# Patient Record
Sex: Female | Born: 1945
Health system: Southern US, Community
[De-identification: ages and names within clinical notes are randomized; demographics above are authoritative.]

## PROBLEM LIST (undated history)

## (undated) DIAGNOSIS — M1990 Unspecified osteoarthritis, unspecified site: Secondary | ICD-10-CM

## (undated) DIAGNOSIS — M8580 Other specified disorders of bone density and structure, unspecified site: Secondary | ICD-10-CM

## (undated) DIAGNOSIS — Z789 Other specified health status: Secondary | ICD-10-CM

## (undated) DIAGNOSIS — S0300XA Dislocation of jaw, unspecified side, initial encounter: Secondary | ICD-10-CM

## (undated) DIAGNOSIS — E785 Hyperlipidemia, unspecified: Secondary | ICD-10-CM

## (undated) DIAGNOSIS — Z79890 Hormone replacement therapy (postmenopausal): Secondary | ICD-10-CM

## (undated) DIAGNOSIS — I10 Essential (primary) hypertension: Secondary | ICD-10-CM

## (undated) HISTORY — PX: NOSE SURGERY: SHX723

## (undated) HISTORY — PX: ANTERIOR AND POSTERIOR REPAIR: SHX1172

## (undated) HISTORY — DX: Dislocation of jaw, unspecified side, initial encounter: S03.00XA

## (undated) HISTORY — DX: Other specified health status: Z78.9

## (undated) HISTORY — DX: Unspecified osteoarthritis, unspecified site: M19.90

## (undated) HISTORY — DX: Other specified disorders of bone density and structure, unspecified site: M85.80

## (undated) HISTORY — PX: VAGINAL HYSTERECTOMY: SUR661

## (undated) HISTORY — DX: Hyperlipidemia, unspecified: E78.5

## (undated) HISTORY — DX: Essential (primary) hypertension: I10

## (undated) HISTORY — PX: BREAST ENHANCEMENT SURGERY: SHX7

## (undated) HISTORY — PX: OTHER SURGICAL HISTORY: SHX169

## (undated) HISTORY — PX: EYE SURGERY: SHX253

## (undated) HISTORY — PX: NASAL SINUS SURGERY: SHX719

## (undated) HISTORY — PX: INCONTINENCE SURGERY: SHX676

## (undated) HISTORY — PX: BLADDER SUSPENSION: SHX72

## (undated) HISTORY — DX: Hormone replacement therapy: Z79.890

## (undated) HISTORY — PX: AUGMENTATION MAMMAPLASTY: SUR837

---

## 1998-11-06 ENCOUNTER — Other Ambulatory Visit: Admission: RE | Admit: 1998-11-06 | Discharge: 1998-11-06 | Payer: Self-pay | Admitting: *Deleted

## 1999-12-30 ENCOUNTER — Other Ambulatory Visit: Admission: RE | Admit: 1999-12-30 | Discharge: 1999-12-30 | Payer: Self-pay | Admitting: *Deleted

## 2001-01-01 ENCOUNTER — Other Ambulatory Visit: Admission: RE | Admit: 2001-01-01 | Discharge: 2001-01-01 | Payer: Self-pay | Admitting: *Deleted

## 2005-05-05 ENCOUNTER — Ambulatory Visit: Payer: Self-pay | Admitting: Otolaryngology

## 2006-05-17 ENCOUNTER — Ambulatory Visit: Payer: Self-pay | Admitting: Obstetrics & Gynecology

## 2006-07-11 ENCOUNTER — Ambulatory Visit: Payer: Self-pay | Admitting: Gastroenterology

## 2007-06-25 ENCOUNTER — Ambulatory Visit: Payer: Self-pay | Admitting: Obstetrics & Gynecology

## 2008-06-26 ENCOUNTER — Ambulatory Visit: Payer: Self-pay | Admitting: Obstetrics & Gynecology

## 2008-07-11 ENCOUNTER — Ambulatory Visit: Payer: Self-pay | Admitting: Obstetrics & Gynecology

## 2009-09-14 ENCOUNTER — Ambulatory Visit: Payer: Self-pay | Admitting: Obstetrics & Gynecology

## 2009-10-07 ENCOUNTER — Ambulatory Visit: Payer: Self-pay | Admitting: Ophthalmology

## 2010-05-16 HISTORY — PX: BREAST SURGERY: SHX581

## 2010-06-29 ENCOUNTER — Ambulatory Visit: Payer: Self-pay | Admitting: Obstetrics and Gynecology

## 2010-06-29 DIAGNOSIS — Z0000 Encounter for general adult medical examination without abnormal findings: Secondary | ICD-10-CM

## 2010-08-06 NOTE — Assessment & Plan Note (Signed)
NAME:  [name], ZAMARRIPA NO.:  [id number]  MEDICAL RECORD NO.:  [id number]          PATIENT TYPE:  LOCATION:  [facility] at [facility]           FACILITY:  PHYSICIAN:  Tinnie Gens, MD             DATE OF BIRTH:  DATE OF SERVICE:  06/29/2010                                 CLINIC NOTE  CHIEF COMPLAINT:  Yearly physical.  HISTORY OF PRESENT ILLNESS:  The patient is a 65 year old, para 1, lady who comes in for an annual exam.  She is status post TVH with bladder tacking and previous sling procedure and continued incontinence which has improved with Estratest.  She has been on hormone replacement therapy for a long period time and would like to discuss the risks of this.  Additionally, the patient has history of osteopenia which has improved somewhat after being on Fosamax.  The patient also had a rupture of a silicone breast implant, so she had saline implants done approximately 2 weeks ago, and she developed a hematoma which had to be drained but this has been removed.  She still has a lot of bruising on her breasts and does not want them examined today.  Her last mammogram was in April 2011 and Bi-Rads 2 secondary to ruptured silicone implants.  PAST MEDICAL HISTORY: 1. TMJ. 2. Osteopenia. 3. History of hypertension.  PAST SURGICAL HISTORY:  Bilateral silicone implants 30 years ago, replaced approximately 2 weeks ago with saline implants, TVH, incontinence, A and P repair followed by a sling procedure and nasal surgery.  MEDICATIONS:  She is on Estratest one p.o. daily, calcium 600 mg p.o. daily, multivitamin one p.o. daily.  She has stopped previously prescribed hydrochlorothiazide for hypertension.  ALLERGIES:  None known.  She is not allergic to latex.  SOCIAL HISTORY:  She is married.  She has __________.  She denies tobacco, alcohol, or drug use.  FAMILY HISTORY:  Significant for colon, ovarian, and prostate cancer in her mother and her father.  A  14-point review of systems reviewed.  She denies significant headache, vision changes, chest pain, shortness of breath, abdominal pain, nausea, vomiting, diarrhea, constipation, blood in her stool, blood in her urine.  PHYSICAL EXAMINATION:  GENERAL:  She is a pleasant female, in no acute distress. VITAL SIGNS:  Blood pressure is 118/76, weight is 140, pulse 67. HEENT:  Normocephalic, atraumatic.  Sclerae anicteric. NECK:  Supple.  Normal thyroid. LUNGS:  Clear bilaterally. CARDIOVASCULAR:  Regular rate and rhythm.  No rubs, gallops, or murmurs. ABDOMEN:  Soft, nontender, nondistended. BREASTS:  There is marked amount of ecchymosis surrounding the lower portion of the right breast implant.  They do appear symmetric. EXTREMITIES:  No cyanosis, clubbing, or edema. GENITOURINARY:  Normal external female genitalia.  Vaginal cuff is intact. There is no mass of the cuff.  Her exam is nontender.  IMPRESSION:  Yearly physical status post recent breast implants, on Estratest.  PLAN: 1. Mammogram in November 2012. 2. I have discussed with the patient the risks, benefits of HRT     including slight increased risk of breast cancer.  The patient     accepts these risks and wants to continue her Estratest.  3. She has a yearly physical with her primary care physician in the     next few weeks.          ______________________________ Tinnie Gens, MD    TP/MEDQ  D:  06/29/2010  T:  06/30/2010  Job:  [id number]

## 2010-09-28 NOTE — Assessment & Plan Note (Signed)
NAME:  [name], [name]              ACCOUNT NO.:  [id number]   MEDICAL RECORD NO.:  [id number]          PATIENT TYPE:  POB   LOCATION:  [facility] at [facility]         FACILITY:  [facility]   PHYSICIAN:  Allie Bossier, MD        DATE OF BIRTH:  [birth year]   DATE OF SERVICE:                                  CLINIC NOTE   Ms. [name] is a 65 year old married white female who is here for annual  exam.  She tells me that her urinary incontinence is improved with the  Estratest.  However, she still has some issues, she is getting ready to  leave for [place] for admission trip, of note.   PAST MEDICAL HISTORY:  TMJ, borderline hypertension, and osteopenia  (she took Fosamax for 5 years, but quit in 2008).   OTHER MEDICAL PROBLEMS:  She has a hairline fracture in her right leg  bone.  She is seeing an orthopedic doctor for this.   REVIEW OF SYSTEMS:  She also sees a chiropractor since December 2009  acupuncture.  She is still married for 37 years, not sexually active  secondary to ED in her husband.  She had a bone density scan in 2009.   No known drug allergies.   SOCIAL HISTORY:  She denies alcohol or tobacco, but she drinks coffee.   PAST SURGICAL HISTORY:  Bilateral saline implants, breast implants more  than 30 years ago, TVH, incontinence sling, and nasal surgery.   MEDICATIONS:  1. Estratest.  2. Calcium daily.  3. Multivitamin daily.  4. Hydrochlorothiazide 12.5 mg p.o. b.i.d.   PHYSICAL EXAMINATION:  VITAL SIGNS:  Weight 135 pounds, blood pressure  98/69, and pulse 72.  HEENT:  Normal.  BREASTS:  Normal with saline implants bilaterally.  ABDOMEN:  Scaphoid.  No hepatosplenomegaly.  EXTERNAL GENITALIA:  No lesions.  Cuff normal.  She has a mild amount of  a vaginal and vulvar atrophy.   ASSESSMENT AND PLAN:  Annual exam.  I recommended self-breast and self  vulvar exams monthly.  With regard to her blood pressure and her  borderline hypertension, her blood pressure today is 98/69.   I have  recommended she half her hydrochlorothiazide twice a day (take 6.25 mg  twice a day).  We will recheck her blood pressure in a couple of weeks  and is stable.  If it is low still, we will consider discontinuing it.  The other would be with regard to her vaginal atrophy and stress  incontinence and her upcoming trip to [place].  I gave her samples of  Estrace vaginal cream.  She can use this half a gram 3 times a week and  see if this improves.  If she wants, we will give her a prescription.      Allie Bossier, MD     MCD/MEDQ  D:  06/26/2008  T:  06/26/2008  Job:  [id number]

## 2010-09-28 NOTE — Assessment & Plan Note (Signed)
NAME:  [name], [name] NO.:  [id number]   MEDICAL RECORD NO.:  [id number]          PATIENT TYPE:  POB   LOCATION:  [facility] at [facility]         FACILITY:  [facility]   PHYSICIAN:  Allie Bossier, MD        DATE OF BIRTH:  28-Aug-1945   DATE OF SERVICE:                                  CLINIC NOTE   IDENTIFICATION:  Ms. Ponce is a 65 year old married white female who is  here for annual exam.  She recently had her physical exam with her  medical doctor. Her regular medical doctor reports that her cholesterol  check was normal.  She needs a refill on her Estratest.  She has no  complaints.   PAST MEDICAL HISTORY:  TMJ.   PAST SURGICAL HISTORY:  1. Nasal surgery.  2. Incontinence sling.  3. Total vaginal hysterectomy.  4. Bilateral breast implants more than 30 years ago (saline).   FAMILY HISTORY:  Negative for breast cancer but her mother had uterine  and colon cancer.   REVIEW OF SYSTEMS:  She is married for 36 years.  She is not sexually  active secondary to ED in her husband.  She works for a [employer].   ALLERGIES:  NO KNOWN DRUG ALLERGIES.   SOCIAL HISTORY:  She drinks coffee but denies alcohol or tobacco use.   MEDICATIONS:  1. Estratest.  2. Calcium.  3. Multivitamin daily.  4. Hydrochlorothiazide 12-1/2 mg daily.   PHYSICAL EXAM:  VITALS:  Weight 140 pounds, blood pressure 124/81, pulse  77.  HEENT:  Normal.  HEART:  Regular rate and rhythm.  LUNGS:  Clear to auscultation bilaterally.  EXTERNAL GENITALIA:  Well estrogenized.  No lesions.  Vaginal cuff and  vagina no lesions.  Bimanual exam, no adnexal masses.  BREAST EXAM:  Relatively hardened breasts secondary to her saline  implants.  No palpable abnormalities.   ASSESSMENT/PLAN:  Annual exam.  I have recommend self vulvar and self  breast exams daily.  I have stressed the importance of vulvar exam and  self breast exams monthly.   Please note I have given her a refill for  Thereasa Solo, MD     MCD/MEDQ  D:  06/25/2007  T:  06/26/2007  Job:  [id number]

## 2010-09-28 NOTE — Assessment & Plan Note (Signed)
NAME:  [name], [name] NO.:  [id number]   MEDICAL RECORD NO.:  [id number]          PATIENT TYPE:  POB   [facility]  [facility]         FACILITY:  [facility]   PHYSICIAN:  Allie Bossier, MD        DATE OF BIRTH:  Jun 22, 1945   DATE OF SERVICE:  09/14/2009                                  CLINIC NOTE   IDENTIFICATION:  [name] is a 65 year old married white lady who is  here for annual exam.  She says her incontinence is improving with the  Estratest and she does not wished further treated.  She has no  particular other GYN complaints.   PAST MEDICAL HISTORY:  Osteopenia, TMJ, and borderline hypertension.   REVIEW OF SYSTEMS:  She is scheduled to have right cataract surgery this  month.  She has been married since 1973.  She is occasionally sexually  active, but her husband has some issues with ED.   MEDICATIONS:  She takes HCTZ 6.25 mg on occasion, as of today she did  take it.  Multivitamin daily, calcium 600 mg daily and Estratest daily.   She regularly goes to [place] for mission trips.  She had a DEXA scan this  year and it was somewhat improved.   SOCIAL HISTORY:  She denies tobacco, alcohol, or drug use.   ALLERGIES:  No known drug allergies.  No latex allergies.   PAST SURGICAL HISTORY:  Bilateral saline implants 30 years ago, a TVH  with an incontinence sling, and nasal surgery.   PHYSICAL EXAMINATION:  GENERAL:  Well-nourished, well-hydrated pleasant  white female in no apparent distress.  VITAL SIGNS:  Height 5 feet 6 inches, weight 139, blood pressure 124/69,  pulse 78.  HEENT:  Normal.  BREASTS:  Normal with implants.  HEART:  Regular rate and rhythm.  LUNGS:  Clear to auscultation bilaterally.  ABDOMEN:  Benign.  No palpable hepatosplenomegaly.  PELVIC:  External genitalia, no lesions except for few varicosities.  Vaginal cuff normal.  Her entire vagina is well estrogenized.  There are  no lesions.  Bimanual exam, there is no masses on  exam and her exam is  nontender.   ASSESSMENT AND PLAN:  Annual exam.  I have recommended self-breast and  self-vulvar exams.  I have recommended that she discontinue her HCTZ  entirely, she come back for blood pressure check within the next month.      Allie Bossier, MD     MCD/MEDQ  D:  09/14/2009  T:  09/15/2009  Job:  [id number]

## 2010-10-01 NOTE — Assessment & Plan Note (Signed)
NAME:  Carolyn, Johnson NO.:  [id number]   MEDICAL RECORD NO.:  [id number]          PATIENT TYPE:  POB   LOCATION:  [facility]         FACILITY:  [facility]   PHYSICIAN:  Carolyn Lincoln, [name]      DATE OF BIRTH:  12/16/1945   DATE OF SERVICE:                                  CLINIC NOTE   HISTORY OF PRESENT ILLNESS:  [name] is a 65 year old married, white  female who is here for her annual exam.  She is planning to have her  mammograms next month.  She needs a refill on her Estratest.  She is  very happy with this drug.  She has no other complaints.   CURRENT MEDICATIONS:  She takes Designer, industrial/product daily.  She takes an  unidentified pain medicine twice a day for TMJ.  She takes Tylenol as  necessary.  She takes vitamins, calcium and juice plus pills.  She  also takes a steroid nasal spray on a daily basis.   PAST SURGICAL HISTORY:  1. Nasal surgery in the last year.  2. Incontinence slings.  3. Hysterectomy.  4. Bilateral breast implants 30+ years ago.   FAMILY HISTORY:  Negative for breast cancer, but her mother had uterine  and colon cancer.   REVIEW OF SYSTEMS:  She is married for 35 years.  She is rarely sexually  active secondary to ED in her husband.  She works for a [employer].  She has rare genuine stress urinary incontinence.  She is  status post-several colonoscopies and these are up to date.   ALLERGIES:  No known drug allergies.   SOCIAL HISTORY:  She denies tobacco or alcohol, but does drink 4 to 5  cups of coffee a day.   PHYSICAL EXAMINATION:  VITAL SIGNS:  Weight 135 pounds.  Blood pressure  140/80, pulse 60.  HEENT:  Normal.  HEART:  Regular rate and rhythm.  LUNGS:  Clear to auscultation bilaterally.  ABDOMEN:  Benign.  BREASTS:  Significant for moderately firm implants.  No palpable masses.  No adenopathy is noted.  GU:  External genitalia is normal.  Mild atrophy.  Cuff is normal with  no evidence of prolapse.  Bimanual  exam reveals no adnexal masses.   ASSESSMENT/PLAN:  A 65 year old hysterectomized patient for her annual  exam.  I refilled her Estratest with 3 months supply with a year's  refill.  I recommended a vulvar exam on a monthly basis as well as  breast exam.           ______________________________  Carolyn Lincoln, [name]     KL/MEDQ  D:  05/17/2006  T:  05/17/2006  Job:  [id number]

## 2011-04-25 ENCOUNTER — Ambulatory Visit (INDEPENDENT_AMBULATORY_CARE_PROVIDER_SITE_OTHER): Payer: BC Managed Care – PPO | Admitting: Obstetrics & Gynecology

## 2011-04-25 ENCOUNTER — Other Ambulatory Visit: Payer: Self-pay | Admitting: Obstetrics & Gynecology

## 2011-04-25 ENCOUNTER — Encounter: Payer: Self-pay | Admitting: Obstetrics & Gynecology

## 2011-04-25 VITALS — BP 136/78 | HR 68 | Ht 66.0 in | Wt 137.0 lb

## 2011-04-25 DIAGNOSIS — R1900 Intra-abdominal and pelvic swelling, mass and lump, unspecified site: Secondary | ICD-10-CM

## 2011-04-25 DIAGNOSIS — Z0000 Encounter for general adult medical examination without abnormal findings: Secondary | ICD-10-CM

## 2011-04-25 DIAGNOSIS — Z01419 Encounter for gynecological examination (general) (routine) without abnormal findings: Secondary | ICD-10-CM

## 2011-04-25 MED ORDER — TESTOSTERONE PROPIONATE 2 % TD CREA: TOPICAL_CREAM | TRANSDERMAL | Status: DC

## 2011-04-25 MED ORDER — ESTRADIOL 0.5 MG PO TABS: 1.0000 mg | ORAL_TABLET | Freq: Every day | ORAL | Status: DC

## 2011-04-25 MED ORDER — HYDROCHLOROTHIAZIDE 12.5 MG PO CAPS: 12.5000 mg | ORAL_CAPSULE | Freq: Every day | ORAL | Status: DC

## 2011-04-25 MED ORDER — TESTOSTERONE PROPIONATE 2 % TD OINT: TOPICAL_OINTMENT | TRANSDERMAL | Status: DC

## 2011-04-25 NOTE — Progress Notes (Signed)
Addended by: Allie Bossier on: 04/25/2011 03:31 PM   Modules accepted: Orders

## 2011-04-25 NOTE — Progress Notes (Signed)
Subjective:    [name] is a 65 y.o. female who presents for an annual exam. Her insurance has changed and she wants to decrease her cost of the Estratest (so I will divide them into 2 generic drugs). She would also like a refill on her HCTZ. The patient is not currently sexually active. GYN screening history: last pap: was normal. The patient wears seatbelts: yes. The patient participates in regular exercise: no. Has the patient ever been transfused or tattooed?: no. The patient reports that there is not domestic violence in her life.   Menstrual History: OB History    Grav Para Term Preterm Abortions TAB SAB Ect Mult Living   1 1              Menarche age: 68 No LMP recorded. Patient has had a hysterectomy.    The following portions of the patient's history were reviewed and updated as appropriate: allergies, current medications, past family history, past medical history, past social history, past surgical history and problem list.  Review of Systems A comprehensive review of systems was negative.  She is married and rarely sexually active as her husband has ED. She is retired. She is scheduled to have her encapsulated silicone implants replaced in January by Dr. Benna Dunks.   Objective:    BP 136/78  Pulse 68  Ht 5\' 6"  (1.676 m)  Wt 137 lb (62.143 kg)  BMI 22.11 kg/m2  General Appearance:    Alert, cooperative, no distress, appears stated age  Head:    Normocephalic, without obvious abnormality, atraumatic  Eyes:    PERRL, conjunctiva/corneas clear, EOM's intact, fundi    benign, both eyes  Ears:    Normal TM's and external ear canals, both ears  Nose:   Nares normal, septum midline, mucosa normal, no drainage    or sinus tenderness  Throat:   Lips, mucosa, and tongue normal; teeth and gums normal  Neck:   Supple, symmetrical, trachea midline, no adenopathy;    thyroid:  no enlargement/tenderness/nodules; no carotid   bruit or JVD  Back:     Symmetric, no curvature, ROM  normal, no CVA tenderness  Lungs:     Clear to auscultation bilaterally, respirations unlabored  Chest Wall:    No tenderness or deformity   Heart:    Regular rate and rhythm, S1 and S2 normal, no murmur, rub   or gallop  Breast Exam:    No tenderness, masses, or nipple abnormality  Abdomen:     Soft, non-tender, bowel sounds active all four quadrants,    no masses, no organomegaly  Genitalia:    Normal female without lesion, discharge or tenderness, minimal atrophy. Normal cuff. Questionable right adnexal mass versus stool     Extremities:   Extremities normal, atraumatic, no cyanosis or edema  Pulses:   2+ and symmetric all extremities  Skin:   Skin color, texture, turgor normal, no rashes or lesions  Lymph nodes:   Cervical, supraclavicular, and axillary nodes normal  Neurologic:   CNII-XII intact, normal strength, sensation and reflexes    throughout  .    Assessment:    Healthy female exam.  Questionable right adnexal mass   Plan:     pelvic ultrasound  , mammogram, I will refill all her meds.

## 2011-05-02 ENCOUNTER — Other Ambulatory Visit: Payer: Self-pay | Admitting: Gynecology

## 2011-05-02 MED ORDER — ESTRADIOL 0.5 MG PO TABS: 1.0000 mg | ORAL_TABLET | Freq: Every day | ORAL | Status: DC

## 2011-05-03 ENCOUNTER — Ambulatory Visit (HOSPITAL_COMMUNITY)
Admission: RE | Admit: 2011-05-03 | Discharge: 2011-05-03 | Disposition: A | Discharge status: Home / Self Care | Payer: Medicare Other | Source: Ambulatory Visit | Attending: Obstetrics & Gynecology | Admitting: Obstetrics & Gynecology

## 2011-05-03 DIAGNOSIS — Z0000 Encounter for general adult medical examination without abnormal findings: Secondary | ICD-10-CM

## 2011-05-03 DIAGNOSIS — N9489 Other specified conditions associated with female genital organs and menstrual cycle: Secondary | ICD-10-CM | POA: Insufficient documentation

## 2011-05-03 DIAGNOSIS — Z90710 Acquired absence of both cervix and uterus: Secondary | ICD-10-CM | POA: Insufficient documentation

## 2011-05-03 DIAGNOSIS — R1900 Intra-abdominal and pelvic swelling, mass and lump, unspecified site: Secondary | ICD-10-CM

## 2011-05-18 ENCOUNTER — Other Ambulatory Visit: Payer: Self-pay | Admitting: *Deleted

## 2011-05-18 DIAGNOSIS — N951 Menopausal and female climacteric states: Secondary | ICD-10-CM

## 2011-05-18 MED ORDER — HYDROCHLOROTHIAZIDE 12.5 MG PO CAPS: 12.5000 mg | ORAL_CAPSULE | Freq: Every day | ORAL | Status: DC

## 2011-05-18 MED ORDER — ESTRADIOL 1 MG PO TABS: 1.0000 mg | ORAL_TABLET | Freq: Every day | ORAL | Status: DC

## 2011-05-18 MED ORDER — TESTOSTERONE PROPIONATE 2 % TD OINT: TOPICAL_OINTMENT | TRANSDERMAL | Status: DC

## 2011-05-18 NOTE — Telephone Encounter (Signed)
Patient needs medications sent to her mail order pharmacy in the correct dosage  And amount.

## 2011-05-31 ENCOUNTER — Telehealth: Payer: Self-pay | Admitting: *Deleted

## 2011-05-31 DIAGNOSIS — N951 Menopausal and female climacteric states: Secondary | ICD-10-CM

## 2011-05-31 MED ORDER — TESTOSTERONE PROPIONATE 2 % TD OINT: TOPICAL_OINTMENT | TRANSDERMAL | Status: DC

## 2011-05-31 NOTE — Telephone Encounter (Signed)
Patients medication was not received by her pharmacy and would like it resent.

## 2011-09-13 ENCOUNTER — Encounter: Payer: Self-pay | Admitting: Orthopedic Surgery

## 2011-09-14 ENCOUNTER — Encounter: Payer: Self-pay | Admitting: Orthopedic Surgery

## 2012-07-02 ENCOUNTER — Ambulatory Visit: Payer: BC Managed Care – PPO | Admitting: Obstetrics & Gynecology

## 2012-07-17 ENCOUNTER — Ambulatory Visit: Payer: BC Managed Care – PPO | Admitting: Obstetrics & Gynecology

## 2012-07-30 ENCOUNTER — Encounter: Payer: Self-pay | Admitting: Obstetrics & Gynecology

## 2012-07-30 ENCOUNTER — Ambulatory Visit (INDEPENDENT_AMBULATORY_CARE_PROVIDER_SITE_OTHER): Payer: Medicare Other | Admitting: Obstetrics & Gynecology

## 2012-07-30 VITALS — BP 115/76 | HR 78 | Ht 66.0 in | Wt 141.0 lb

## 2012-07-30 DIAGNOSIS — Z01419 Encounter for gynecological examination (general) (routine) without abnormal findings: Secondary | ICD-10-CM

## 2012-07-30 MED ORDER — EST ESTROGENS-METHYLTEST 1.25-2.5 MG PO TABS: 1.0000 | ORAL_TABLET | Freq: Every day | ORAL | Status: DC

## 2012-07-30 MED ORDER — HYDROCHLOROTHIAZIDE 12.5 MG PO CAPS: 12.5000 mg | ORAL_CAPSULE | Freq: Every day | ORAL | Status: DC

## 2012-07-30 NOTE — Progress Notes (Signed)
Subjective:    [name] is a 66 y.o. female who presents for an annual exam. The patient has no complaints today. She wants her meds refilled. The patient is rarely sexually active. GYN screening history: last pap: was normal. The patient wears seatbelts: yes. The patient participates in regular exercise: yes. (pilates/yoga) Has the patient ever been transfused or tattooed?: no. The patient reports that there is not domestic violence in her life.   Menstrual History: OB History   Grav Para Term Preterm Abortions TAB SAB Ect Mult Living   1 1              Menarche age: 32  No LMP recorded. Patient has had a hysterectomy.    The following portions of the patient's history were reviewed and updated as appropriate: allergies, current medications, past family history, past medical history, past social history, past surgical history and problem list.  Review of Systems A comprehensive review of systems was negative. Married for 41 years, retired.    Objective:    BP 115/76  Pulse 78  Ht 5\' 6"  (1.676 m)  Wt 141 lb (63.957 kg)  BMI 22.77 kg/m2  General Appearance:    Alert, cooperative, no distress, appears stated age  Head:    Normocephalic, without obvious abnormality, atraumatic  Eyes:    PERRL, conjunctiva/corneas clear, EOM's intact, fundi    benign, both eyes  Ears:    Normal TM's and external ear canals, both ears  Nose:   Nares normal, septum midline, mucosa normal, no drainage    or sinus tenderness  Throat:   Lips, mucosa, and tongue normal; teeth and gums normal  Neck:   Supple, symmetrical, trachea midline, no adenopathy;    thyroid:  no enlargement/tenderness/nodules; no carotid   bruit or JVD  Back:     Symmetric, no curvature, ROM normal, no CVA tenderness  Lungs:     Clear to auscultation bilaterally, respirations unlabored  Chest Wall:    No tenderness or deformity   Heart:    Regular rate and rhythm, S1 and S2 normal, no murmur, rub   or gallop  Breast Exam:     No tenderness, masses, or nipple abnormality  Abdomen:     Soft, non-tender, bowel sounds active all four quadrants,    no masses, no organomegaly  Genitalia:    Normal female without lesion, discharge or tenderness, minimal atrophy, normal vagina/pelvic exam (no tenderness or masses)     Extremities:   Extremities normal, atraumatic, no cyanosis or edema  Pulses:   2+ and symmetric all extremities  Skin:   Skin color, texture, turgor normal, no rashes or lesions  Lymph nodes:   Cervical, supraclavicular, and axillary nodes normal  Neurologic:   CNII-XII intact, normal strength, sensation and reflexes    throughout  .    Assessment:    Healthy female exam.    Plan:     Breast self exam technique reviewed and patient encouraged to perform self-exam monthly. Mammogram.  I have refilled her Estratest

## 2013-08-06 ENCOUNTER — Ambulatory Visit: Payer: Medicare Other | Admitting: Obstetrics & Gynecology

## 2013-09-10 ENCOUNTER — Ambulatory Visit (INDEPENDENT_AMBULATORY_CARE_PROVIDER_SITE_OTHER): Payer: Medicare Other | Admitting: Obstetrics & Gynecology

## 2013-09-10 ENCOUNTER — Encounter: Payer: Self-pay | Admitting: Obstetrics & Gynecology

## 2013-09-10 VITALS — BP 133/73 | HR 71 | Ht 65.0 in | Wt 131.0 lb

## 2013-09-10 DIAGNOSIS — Z01419 Encounter for gynecological examination (general) (routine) without abnormal findings: Secondary | ICD-10-CM

## 2013-09-10 DIAGNOSIS — Z0000 Encounter for general adult medical examination without abnormal findings: Secondary | ICD-10-CM

## 2013-09-10 MED ORDER — EST ESTROGENS-METHYLTEST 1.25-2.5 MG PO TABS: 1.0000 | ORAL_TABLET | Freq: Every day | ORAL | Status: DC

## 2013-09-10 NOTE — Addendum Note (Signed)
Addended by: Erik Obey on: 09/10/2013 03:22 PM   Modules accepted: Orders

## 2013-09-10 NOTE — Progress Notes (Signed)
Subjective:    [name] is a 68 y.o. female who presents for an annual exam. She mentions that when she was in [place] recently, she developed some groin irritation with all the sweating. It's much better now. The patient is sexually active. GYN screening history: last pap: was normal. The patient wears seatbelts: yes. The patient participates in regular exercise: yes. Has the patient ever been transfused or tattooed?: no. The patient reports that there is not domestic violence in her life.   Menstrual History: OB History   Grav Para Term Preterm Abortions TAB SAB Ect Mult Living   1 1 1       1       Menarche age: 29  No LMP recorded. Patient has had a hysterectomy.    The following portions of the patient's history were reviewed and updated as appropriate: allergies, current medications, past family history, past medical history, past social history, past surgical history and problem list.  Review of Systems A comprehensive review of systems was negative. Travels to [place] frequently for mission trips. Married 42 years.Her colonoscopy is UTD.   Objective:    BP 133/73  Pulse 71  Ht 5\' 5"  (1.651 m)  Wt 131 lb (59.421 kg)  BMI 21.80 kg/m2  General Appearance:    Alert, cooperative, no distress, appears stated age  Head:    Normocephalic, without obvious abnormality, atraumatic  Eyes:    PERRL, conjunctiva/corneas clear, EOM's intact, fundi    benign, both eyes  Ears:    Normal TM's and external ear canals, both ears  Nose:   Nares normal, septum midline, mucosa normal, no drainage    or sinus tenderness  Throat:   Lips, mucosa, and tongue normal; teeth and gums normal  Neck:   Supple, symmetrical, trachea midline, no adenopathy;    thyroid:  no enlargement/tenderness/nodules; no carotid   bruit or JVD  Back:     Symmetric, no curvature, ROM normal, no CVA tenderness  Lungs:     Clear to auscultation bilaterally, respirations unlabored  Chest Wall:    No tenderness or  deformity   Heart:    Regular rate and rhythm, S1 and S2 normal, no murmur, rub   or gallop  Breast Exam:    No tenderness, masses, or nipple abnormality  Abdomen:     Soft, non-tender, bowel sounds active all four quadrants,    no masses, no organomegaly  Genitalia:    Normal female without lesion, discharge or tenderness, no masses on bimanual     Extremities:   Extremities normal, atraumatic, no cyanosis or edema  Pulses:   2+ and symmetric all extremities  Skin:   Skin color, texture, turgor normal, no rashes or lesions  Lymph nodes:   Cervical, supraclavicular, and axillary nodes normal  Neurologic:   CNII-XII intact, normal strength, sensation and reflexes    throughout  .    Assessment:    Healthy female exam.    Plan:     Breast self exam technique reviewed and patient encouraged to perform self-exam monthly. Mammogram. already scheduled. Estratest refilled per her request

## 2013-09-10 NOTE — Addendum Note (Signed)
Addended by: Erik Obey on: 09/10/2013 02:17 PM   Modules accepted: Orders

## 2013-11-24 LAB — HM COLONOSCOPY

## 2013-12-13 ENCOUNTER — Ambulatory Visit: Payer: Self-pay | Admitting: Gastroenterology

## 2014-03-17 ENCOUNTER — Encounter: Payer: Self-pay | Admitting: Obstetrics & Gynecology

## 2014-04-22 ENCOUNTER — Telehealth: Payer: Self-pay | Admitting: *Deleted

## 2014-04-22 NOTE — Telephone Encounter (Signed)
Patient would like to know if she can have a prescription for separate testosterone and estrogen as the Estratest prescription that she has increased in price and is 100.00 per month.  Just let me know if this can be done and I will call it in for her.

## 2014-04-22 NOTE — Telephone Encounter (Signed)
Patient is not able to fill Estratest prescription because the cost has increased significantly.  She wants to know if she can take two separate prescriptions of estrogen and testosterone that would work just as well and not be 100.00 a month.  Just let me know and I will call it in for her if we can do this.

## 2014-05-05 ENCOUNTER — Telehealth: Payer: Self-pay | Admitting: *Deleted

## 2014-05-05 DIAGNOSIS — Z780 Asymptomatic menopausal state: Secondary | ICD-10-CM

## 2014-05-05 MED ORDER — FIRST-TESTOSTERONE 2 % TD OINT: 1.0000 [drp] | TOPICAL_OINTMENT | TRANSDERMAL | Status: DC

## 2014-05-05 MED ORDER — ESTRADIOL 0.5 MG PO TABS: 0.5000 mg | ORAL_TABLET | Freq: Every day | ORAL | Status: DC

## 2014-05-05 NOTE — Telephone Encounter (Signed)
-----   Message from Emily Filbert, MD sent at 05/05/2014  3:00 PM EST ----- She can have estradiol 0.5 mg daily and testosterone ointment 2%, apply small amount 3 times per week. Thanks

## 2014-07-18 ENCOUNTER — Encounter (INDEPENDENT_AMBULATORY_CARE_PROVIDER_SITE_OTHER): Payer: PPO | Admitting: Ophthalmology

## 2014-07-18 DIAGNOSIS — H35033 Hypertensive retinopathy, bilateral: Secondary | ICD-10-CM | POA: Diagnosis not present

## 2014-07-18 DIAGNOSIS — I10 Essential (primary) hypertension: Secondary | ICD-10-CM | POA: Diagnosis not present

## 2014-07-18 DIAGNOSIS — H43813 Vitreous degeneration, bilateral: Secondary | ICD-10-CM | POA: Diagnosis not present

## 2014-10-27 ENCOUNTER — Encounter: Payer: Self-pay | Admitting: Family Medicine

## 2014-10-27 ENCOUNTER — Ambulatory Visit (INDEPENDENT_AMBULATORY_CARE_PROVIDER_SITE_OTHER): Payer: PPO | Admitting: Family Medicine

## 2014-10-27 VITALS — BP 110/68 | HR 68 | Temp 98.1°F | Ht 64.75 in | Wt 134.0 lb

## 2014-10-27 DIAGNOSIS — Z780 Asymptomatic menopausal state: Secondary | ICD-10-CM

## 2014-10-27 DIAGNOSIS — R002 Palpitations: Secondary | ICD-10-CM

## 2014-10-27 DIAGNOSIS — Z0000 Encounter for general adult medical examination without abnormal findings: Secondary | ICD-10-CM | POA: Diagnosis not present

## 2014-10-27 DIAGNOSIS — Z79899 Other long term (current) drug therapy: Secondary | ICD-10-CM

## 2014-10-27 DIAGNOSIS — N3946 Mixed incontinence: Secondary | ICD-10-CM | POA: Diagnosis not present

## 2014-10-27 DIAGNOSIS — Z1239 Encounter for other screening for malignant neoplasm of breast: Secondary | ICD-10-CM | POA: Diagnosis not present

## 2014-10-27 LAB — UA/M W/RFLX CULTURE, ROUTINE
BILIRUBIN UA: NEGATIVE
GLUCOSE, UA: NEGATIVE
KETONES UA: NEGATIVE
Leukocytes, UA: NEGATIVE
NITRITE UA: NEGATIVE
Protein, UA: NEGATIVE
RBC UA: NEGATIVE
Specific Gravity, UA: 1.015 (ref 1.005–1.030)
UUROB: 0.2 mg/dL (ref 0.2–1.0)
pH, UA: 7 (ref 5.0–7.5)

## 2014-10-27 MED ORDER — ESTRADIOL 0.1 MG/GM VA CREA: TOPICAL_CREAM | VAGINAL | Status: DC

## 2014-10-27 NOTE — Patient Instructions (Addendum)
Stop the estrogen/testosterone pills Start estrogen topically Take the HCTZ every OTHER day for 1-2 weeks, the stop completely and come by for a lab visit so Amy can check your blood pressure in 2-3 weeks Three servings of calcium daily (best in diet like broccoli, tofu, kale, spinach, almond milk, etc.) but supplement if absolutely needed to reach 1000 to 1200 mg daily Try turmeric as a natural anti-inflammatory (for pain and arthritis). It comes in capsules where you buy aspirin and fish oil, but also as a spice where you buy pepper and garlic powder. Try tylenol (acetaminophen) and/or biofreeze or topical ice therapy for knee arthritis  We'll refer you to a cardiologist about your heart pounding with exercises to see if they will consider a stress test I've ordered a bone density test for you If you have not heard anything from my staff in a week about any orders/referrals/studies entered today, please contact us here to follow-up Mammograms are recommended every 1-2 years Return in 12+ months for Medicare Wellness visit PLUS physical, 45 minute appointment   Health Maintenance Adopting a healthy lifestyle and getting preventive care can go a long way to promote health and wellness. Talk with your health care provider about what schedule of regular examinations is right for you. This is a good chance for you to check in with your provider about disease prevention and staying healthy. In between checkups, there are plenty of things you can do on your own. Experts have done a lot of research about which lifestyle changes and preventive measures are most likely to keep you healthy. Ask your health care provider for more information. WEIGHT AND DIET  Eat a healthy diet  Be sure to include plenty of vegetables, fruits, low-fat dairy products, and lean protein.  Do not eat a lot of foods high in solid fats, added sugars, or salt.  Get regular exercise. This is one of the most important things you  can do for your health.  Most adults should exercise for at least 150 minutes each week. The exercise should increase your heart rate and make you sweat (moderate-intensity exercise).  Most adults should also do strengthening exercises at least twice a week. This is in addition to the moderate-intensity exercise.  Maintain a healthy weight  Body mass index (BMI) is a measurement that can be used to identify possible weight problems. It estimates body fat based on height and weight. Your health care provider can help determine your BMI and help you achieve or maintain a healthy weight.  For females 46 years of age and older:   A BMI below 18.5 is considered underweight.  A BMI of 18.5 to 24.9 is normal.  A BMI of 25 to 29.9 is considered overweight.  A BMI of 30 and above is considered obese.  Watch levels of cholesterol and blood lipids  You should start having your blood tested for lipids and cholesterol at 69 years of age, then have this test every 5 years.  You may need to have your cholesterol levels checked more often if:  Your lipid or cholesterol levels are high.  You are older than 69 years of age.  You are at high risk for heart disease.  CANCER SCREENING   Lung Cancer  Lung cancer screening is recommended for adults 31-6 years old who are at high risk for lung cancer because of a history of smoking.  A yearly low-dose CT scan of the lungs is recommended for people who:  Currently  smoke.  Have quit within the past 15 years.  Have at least a 30-pack-year history of smoking. A pack year is smoking an average of one pack of cigarettes a day for 1 year.  Yearly screening should continue until it has been 15 years since you quit.  Yearly screening should stop if you develop a health problem that would prevent you from having lung cancer treatment.  Breast Cancer  Practice breast self-awareness. This means understanding how your breasts normally appear and  feel.  It also means doing regular breast self-exams. Let your health care provider know about any changes, no matter how small.  If you are in your 20s or 30s, you should have a clinical breast exam (CBE) by a health care provider every 1-3 years as part of a regular health exam.  If you are 16 or older, have a CBE every year. Also consider having a breast X-ray (mammogram) every year.  If you have a family history of breast cancer, talk to your health care provider about genetic screening.  If you are at high risk for breast cancer, talk to your health care provider about having an MRI and a mammogram every year.  Breast cancer gene (BRCA) assessment is recommended for women who have family members with BRCA-related cancers. BRCA-related cancers include:  Breast.  Ovarian.  Tubal.  Peritoneal cancers.  Results of the assessment will determine the need for genetic counseling and BRCA1 and BRCA2 testing. Cervical Cancer Routine pelvic examinations to screen for cervical cancer are no longer recommended for nonpregnant women who are considered low risk for cancer of the pelvic organs (ovaries, uterus, and vagina) and who do not have symptoms. A pelvic examination may be necessary if you have symptoms including those associated with pelvic infections. Ask your health care provider if a screening pelvic exam is right for you.   The Pap test is the screening test for cervical cancer for women who are considered at risk.  If you had a hysterectomy for a problem that was not cancer or a condition that could lead to cancer, then you no longer need Pap tests.  If you are older than 65 years, and you have had normal Pap tests for the past 10 years, you no longer need to have Pap tests.  If you have had past treatment for cervical cancer or a condition that could lead to cancer, you need Pap tests and screening for cancer for at least 20 years after your treatment.  If you no longer get a Pap  test, assess your risk factors if they change (such as having a new sexual partner). This can affect whether you should start being screened again.  Some women have medical problems that increase their chance of getting cervical cancer. If this is the case for you, your health care provider may recommend more frequent screening and Pap tests.  The human papillomavirus (HPV) test is another test that may be used for cervical cancer screening. The HPV test looks for the virus that can cause cell changes in the cervix. The cells collected during the Pap test can be tested for HPV.  The HPV test can be used to screen women 61 years of age and older. Getting tested for HPV can extend the interval between normal Pap tests from three to five years.  An HPV test also should be used to screen women of any age who have unclear Pap test results.  After 69 years of age, women should  have HPV testing as often as Pap tests.  Colorectal Cancer  This type of cancer can be detected and often prevented.  Routine colorectal cancer screening usually begins at 69 years of age and continues through 69 years of age.  Your health care provider may recommend screening at an earlier age if you have risk factors for colon cancer.  Your health care provider may also recommend using home test kits to check for hidden blood in the stool.  A small camera at the end of a tube can be used to examine your colon directly (sigmoidoscopy or colonoscopy). This is done to check for the earliest forms of colorectal cancer.  Routine screening usually begins at age 25.  Direct examination of the colon should be repeated every 5-10 years through 69 years of age. However, you may need to be screened more often if early forms of precancerous polyps or small growths are found. Skin Cancer  Check your skin from head to toe regularly.  Tell your health care provider about any new moles or changes in moles, especially if there is a  change in a mole's shape or color.  Also tell your health care provider if you have a mole that is larger than the size of a pencil eraser.  Always use sunscreen. Apply sunscreen liberally and repeatedly throughout the day.  Protect yourself by wearing long sleeves, pants, a wide-brimmed hat, and sunglasses whenever you are outside. HEART DISEASE, DIABETES, AND HIGH BLOOD PRESSURE   Have your blood pressure checked at least every 1-2 years. High blood pressure causes heart disease and increases the risk of stroke.  If you are between 52 years and 74 years old, ask your health care provider if you should take aspirin to prevent strokes.  Have regular diabetes screenings. This involves taking a blood sample to check your fasting blood sugar level.  If you are at a normal weight and have a low risk for diabetes, have this test once every three years after 69 years of age.  If you are overweight and have a high risk for diabetes, consider being tested at a younger age or more often. PREVENTING INFECTION  Hepatitis B  If you have a higher risk for hepatitis B, you should be screened for this virus. You are considered at high risk for hepatitis B if:  You were born in a country where hepatitis B is common. Ask your health care provider which countries are considered high risk.  Your parents were born in a high-risk country, and you have not been immunized against hepatitis B (hepatitis B vaccine).  You have HIV or AIDS.  You use needles to inject street drugs.  You live with someone who has hepatitis B.  You have had sex with someone who has hepatitis B.  You get hemodialysis treatment.  You take certain medicines for conditions, including cancer, organ transplantation, and autoimmune conditions. Hepatitis C  Blood testing is recommended for:  Everyone born from [birth year] through 1965.  Anyone with known risk factors for hepatitis C. Sexually transmitted infections (STIs)  You  should be screened for sexually transmitted infections (STIs) including gonorrhea and chlamydia if:  You are sexually active and are younger than 69 years of age.  You are older than 69 years of age and your health care provider tells you that you are at risk for this type of infection.  Your sexual activity has changed since you were last screened and you are at an increased risk  for chlamydia or gonorrhea. Ask your health care provider if you are at risk.  If you do not have HIV, but are at risk, it may be recommended that you take a prescription medicine daily to prevent HIV infection. This is called pre-exposure prophylaxis (PrEP). You are considered at risk if:  You are sexually active and do not regularly use condoms or know the HIV status of your partner(s).  You take drugs by injection.  You are sexually active with a partner who has HIV. Talk with your health care provider about whether you are at high risk of being infected with HIV. If you choose to begin PrEP, you should first be tested for HIV. You should then be tested every 3 months for as long as you are taking PrEP.  PREGNANCY   If you are premenopausal and you may become pregnant, ask your health care provider about preconception counseling.  If you may become pregnant, take 400 to 800 micrograms (mcg) of folic acid every day.  If you want to prevent pregnancy, talk to your health care provider about birth control (contraception). OSTEOPOROSIS AND MENOPAUSE   Osteoporosis is a disease in which the bones lose minerals and strength with aging. This can result in serious bone fractures. Your risk for osteoporosis can be identified using a bone density scan.  If you are 67 years of age or older, or if you are at risk for osteoporosis and fractures, ask your health care provider if you should be screened.  Ask your health care provider whether you should take a calcium or vitamin D supplement to lower your risk for  osteoporosis.  Menopause may have certain physical symptoms and risks.  Hormone replacement therapy may reduce some of these symptoms and risks. Talk to your health care provider about whether hormone replacement therapy is right for you.  HOME CARE INSTRUCTIONS   Schedule regular health, dental, and eye exams.  Stay current with your immunizations.   Do not use any tobacco products including cigarettes, chewing tobacco, or electronic cigarettes.  If you are pregnant, do not drink alcohol.  If you are breastfeeding, limit how much and how often you drink alcohol.  Limit alcohol intake to no more than 1 drink per day for nonpregnant women. One drink equals 12 ounces of beer, 5 ounces of wine, or 1 ounces of hard liquor.  Do not use street drugs.  Do not share needles.  Ask your health care provider for help if you need support or information about quitting drugs.  Tell your health care provider if you often feel depressed.  Tell your health care provider if you have ever been abused or do not feel safe at home. Document Released: 11/15/2010 Document Revised: 09/16/2013 Document Reviewed: 04/03/2013 [facility] Patient Information 2015 [place], [place]. This information is not intended to replace advice given to you by your health care provider. Make sure you discuss any questions you have with your health care provider.

## 2014-10-27 NOTE — Progress Notes (Signed)
Patient ID: [name], female   DOB: 13-Jan-1946, 69 y.o.   MRN: [id number]   Subjective:   [name] is a 69 y.o. female here for a complete physical exam  Interim issues since last visit:  Past Medical History  Diagnosis Date  . TMJ (dislocation of temporomandibular joint)   . Osteopenia   . Hypertension   . Cataract 2011    remove from right eye  . Arthritis     in back  . Hyperlipidemia    Past Surgical History  Procedure Laterality Date  . Breast enhancement surgery    . Incontinence surgery    . Anterior and posterior repair    . Bladder suspension    . Nose surgery    . Eye surgery  20114    right eye  .  mesh sling x 15 yrs    . Breast surgery  2012    BILATERAL BREAST IMPLANT REMOVAL  . Vaginal hysterectomy      still has ovaries  . Nasal sinus surgery     Family History  Problem Relation Age of Onset  . Cancer Mother     Johnson AND UTERINE  . Cancer Father     PROSTATE  . Diabetes Father   . Hypertension Father   . Stroke Father   . Arthritis Sister    History  Substance Use Topics  . Smoking status: Never Smoker   . Smokeless tobacco: Never Used  . Alcohol Use: No   Current Outpatient Prescriptions on File Prior to Visit  Medication Sig Dispense Refill  . Alfalfa 250 MG TABS Take by mouth.    Marland Kitchen aspirin 81 MG tablet Take 81 mg by mouth daily.    . calcium carbonate (OS-CAL) 600 MG TABS Take 600 mg by mouth 2 (two) times daily with a meal.      . celecoxib (CELEBREX) 200 MG capsule     . cholecalciferol (VITAMIN D) 1000 UNITS tablet Take 1,000 Units by mouth daily.    . fish oil-omega-3 fatty acids 1000 MG capsule Take 2 g by mouth daily.    Marland Kitchen glucosamine-chondroitin 500-400 MG tablet Take 1 tablet by mouth 3 (three) times daily.    . hydrochlorothiazide (MICROZIDE) 12.5 MG capsule Take 1 capsule (12.5 mg total) by mouth daily. 90 capsule 5  . multivitamin-lutein (OCUVITE-LUTEIN) CAPS Take 1 capsule by mouth daily.    . chloroquine  (ARALEN) 250 MG tablet Take 500 mg by mouth once a week.    Marland Kitchen FIRST-TESTOSTERONE 2 % OINT Place 1 drop onto the skin 3 (three) times a week. (Patient not taking: Reported on 10/27/2014) 60 g 3   No current facility-administered medications on file prior to visit.   No Known Allergies ------------- Review of Systems  Constitutional: Negative for fever and unexpected weight change.  HENT: Negative for congestion, dental problem and mouth sores.   Eyes: Negative for pain and discharge.  Respiratory: Negative for cough, choking and shortness of breath.   Cardiovascular: Negative for chest pain, palpitations and leg swelling.       She says her heart was really pounding like she had overworked, had to sit down and thought she was going to have a stroke; she was working in the yard; can't hardly work any more, like 5-10 minutes now instead of much longer before; she has just noticed this over the last few weeks; she had to go inside and laid down; rested a few minutes and she  was fine  Gastrointestinal: Negative for blood in stool.  Endocrine: Negative for polydipsia, polyphagia and polyuria.  Genitourinary: Negative for hematuria.       Some urine leaking, all the time, not just coughing or laughing; would like to try topical estrogen  Musculoskeletal:       Had bursitis last year, did acupuncture and dry needling; came right back after finishing treatment; saw Dr. Almedia Balls gave two cortisone injections a few weeks ago  Allergic/Immunologic: Negative for environmental allergies and food allergies.  Neurological: Negative for tremors, seizures, numbness and headaches.  Hematological: Does not bruise/bleed easily.       Bruise on skin where the puppy hit her  Psychiatric/Behavioral: Negative for hallucinations and dysphoric mood. The patient is not nervous/anxious.     No results found for: CHOL, HDL, LDLCALC, LDLDIRECT, TRIG, CHOLHDL No results found for: HGBA1C No results found for:  MICROALBUR, MALB24HUR No results found for: NA, K, CL, CO2 No results found for: CREATININE No results found for: WBC, HGB, HCT, MCV, PLT  Objective:   Filed Vitals:   10/27/14 0843  BP: 110/68  Pulse: 68  Temp: 98.1 F (36.7 C)  Height: 5' 4.75" (1.645 m)  Weight: 134 lb (60.782 kg)  SpO2: 97%   Body mass index is 22.46 kg/(m^2). Wt Readings from Last 3 Encounters:  10/27/14 134 lb (60.782 kg)  04/25/14 137 lb (62.143 kg)  09/10/13 131 lb (59.421 kg)   Physical Exam  Constitutional: She is well-developed, well-nourished, and in no distress. No distress.  HENT:  Head: Normocephalic and atraumatic.  Mouth/Throat: Oropharynx is clear and moist. No oropharyngeal exudate.  Eyes: EOM are normal. Right eye exhibits no discharge. Left eye exhibits no discharge. No scleral icterus.  Neck: Normal range of motion. Neck supple. No JVD present. No thyromegaly present.  Cardiovascular: Normal rate, regular rhythm and normal heart sounds.  Exam reveals no friction rub.   No murmur heard. Pulmonary/Chest: Effort normal and breath sounds normal. No respiratory distress. She has no wheezes.  Abdominal: Soft. Bowel sounds are normal. She exhibits no distension. There is no tenderness. There is no guarding.  Genitourinary: Vagina normal, right adnexa normal and left adnexa normal. No vaginal discharge found.  Musculoskeletal: Normal range of motion. She exhibits no edema or tenderness.  Lymphadenopathy:    She has no cervical adenopathy.  Neurological: She is alert. She exhibits normal muscle tone. Gait normal. Coordination normal.  Skin: Skin is warm and dry. Rash (ecchymoses in line over lower leg where puppy scratched her; no drainage, no purulence) noted. She is not diaphoretic. No erythema.  Psychiatric: Mood, memory, affect and judgment normal.    Assessment/Plan:   Problem List Items Addressed This Visit    None    Visit Diagnoses    Annual physical exam    -  Primary     Relevant Orders    Comprehensive metabolic panel    Lipid Panel w/o Chol/HDL Ratio    CBC with Differential    Pounding heartbeat        Relevant Orders    EKG 12-Lead (Completed)    Ambulatory referral to Cardiology    Mixed incontinence        Relevant Orders    Urinalysis, Routine w reflex microscopic    Encounter for long-term (current) use of medications        Postmenopausal        Relevant Orders    DG Bone Density    Screening  for breast cancer        Relevant Orders    MM Digital Screening        Meds ordered this encounter  Medications  . Multiple Vitamin (MULTIVITAMIN) capsule    Sig: Take 1 capsule by mouth daily.  Marland Kitchen estradiol (ESTRACE VAGINAL) 0.1 MG/GM vaginal cream    Sig: One applicator vaginally twice a week; stop estrogen/test pills    Dispense:  42.5 g    Refill:  12   Orders Placed This Encounter  Procedures  . DG Bone Density    Order Specific Question:  Reason for Exam (SYMPTOM  OR DIAGNOSIS REQUIRED)    Answer:  postmenopausal    Order Specific Question:  Preferred imaging location?    Answer:  [facility]  . MM Digital Screening    Bilateral breast implants    Order Specific Question:  Reason for Exam (SYMPTOM  OR DIAGNOSIS REQUIRED)    Answer:  screening    Order Specific Question:  Preferred imaging location?    Answer:  [facility]  . Comprehensive metabolic panel    Order Specific Question:  Has the patient fasted?    Answer:  Yes  . Lipid Panel w/o Chol/HDL Ratio    Order Specific Question:  Has the patient fasted?    Answer:  Yes  . CBC with Differential  . Urinalysis, Routine w reflex microscopic  . Ambulatory referral to Cardiology    Referral Priority:  Routine    Referral Type:  Consultation    Referral Reason:  Specialty Services Required    Requested Specialty:  Cardiology    Number of Visits Requested:  1  . EKG 12-Lead    Order Specific Question:  Where should this test be performed    Answer:  Other     Follow up plan: Return in about 1 year (around 10/27/2015) for Wellness visit plus physical.   An After Visit Summary was printed and given to the patient.

## 2014-10-28 ENCOUNTER — Encounter: Payer: Self-pay | Admitting: Family Medicine

## 2014-10-28 LAB — LIPID PANEL W/O CHOL/HDL RATIO
CHOLESTEROL TOTAL: 221 mg/dL — AB (ref 100–199)
HDL: 79 mg/dL (ref >39)
LDL CALC: 133 mg/dL — AB (ref 0–99)
TRIGLYCERIDES: 43 mg/dL (ref 0–149)
VLDL CHOLESTEROL CAL: 9 mg/dL (ref 5–40)

## 2014-10-28 LAB — CBC WITH DIFFERENTIAL/PLATELET
BASOS: 1 %
Basophils Absolute: 0 10*3/uL (ref 0.0–0.2)
EOS (ABSOLUTE): 0.1 10*3/uL (ref 0.0–0.4)
EOS: 2 %
Hematocrit: 41 % (ref 34.0–46.6)
Hemoglobin: 14.3 g/dL (ref 11.1–15.9)
IMMATURE GRANS (ABS): 0 10*3/uL (ref 0.0–0.1)
Immature Granulocytes: 0 %
LYMPHS ABS: 1.5 10*3/uL (ref 0.7–3.1)
Lymphs: 26 %
MCH: 32.4 pg (ref 26.6–33.0)
MCHC: 34.9 g/dL (ref 31.5–35.7)
MCV: 93 fL (ref 79–97)
MONOS ABS: 0.7 10*3/uL (ref 0.1–0.9)
Monocytes: 12 %
NEUTROS ABS: 3.5 10*3/uL (ref 1.4–7.0)
NEUTROS PCT: 59 %
Platelets: 207 10*3/uL (ref 150–379)
RBC: 4.41 x10E6/uL (ref 3.77–5.28)
RDW: 13.3 % (ref 12.3–15.4)
WBC: 5.8 10*3/uL (ref 3.4–10.8)

## 2014-10-28 LAB — COMPREHENSIVE METABOLIC PANEL
A/G RATIO: 2 (ref 1.1–2.5)
ALK PHOS: 50 IU/L (ref 39–117)
ALT: 14 IU/L (ref 0–32)
AST: 21 IU/L (ref 0–40)
Albumin: 4.1 g/dL (ref 3.6–4.8)
BILIRUBIN TOTAL: 0.3 mg/dL (ref 0.0–1.2)
BUN / CREAT RATIO: 20 (ref 11–26)
BUN: 16 mg/dL (ref 8–27)
CALCIUM: 9.7 mg/dL (ref 8.7–10.3)
CO2: 27 mmol/L (ref 18–29)
Chloride: 100 mmol/L (ref 97–108)
Creatinine, Ser: 0.8 mg/dL (ref 0.57–1.00)
GFR calc non Af Amer: 76 mL/min/{1.73_m2} (ref >59)
GFR, EST AFRICAN AMERICAN: 88 mL/min/{1.73_m2} (ref >59)
Globulin, Total: 2.1 g/dL (ref 1.5–4.5)
Glucose: 79 mg/dL (ref 65–99)
POTASSIUM: 4.6 mmol/L (ref 3.5–5.2)
Sodium: 140 mmol/L (ref 134–144)
Total Protein: 6.2 g/dL (ref 6.0–8.5)

## 2014-11-05 NOTE — Progress Notes (Cosign Needed)
It looks as if the urine was no positive so it did not reflex to do a culture.

## 2014-11-11 ENCOUNTER — Ambulatory Visit: Payer: PPO

## 2014-11-11 VITALS — BP 135/77 | HR 57

## 2014-11-11 DIAGNOSIS — I10 Essential (primary) hypertension: Secondary | ICD-10-CM

## 2014-11-11 NOTE — Progress Notes (Unsigned)
Patient came in for BP Check. She is doing fine, no issues or complaints. She is taking the HCTZ 12.5mg  every other day. BP today is 135/77, pulse 57. Does she need to continue taking as she is now? If so, need a refill.

## 2014-11-23 ENCOUNTER — Other Ambulatory Visit: Payer: Self-pay | Admitting: Family Medicine

## 2014-11-23 DIAGNOSIS — N951 Menopausal and female climacteric states: Secondary | ICD-10-CM

## 2014-11-23 DIAGNOSIS — I10 Essential (primary) hypertension: Secondary | ICD-10-CM

## 2014-11-23 MED ORDER — HYDROCHLOROTHIAZIDE 12.5 MG PO CAPS: 12.5000 mg | ORAL_CAPSULE | ORAL | Status: DC

## 2014-12-19 ENCOUNTER — Ambulatory Visit: Payer: Self-pay | Admitting: Cardiovascular Disease

## 2015-02-27 ENCOUNTER — Other Ambulatory Visit: Payer: Self-pay

## 2015-02-27 MED ORDER — EST ESTROGENS-METHYLTEST 1.25-2.5 MG PO TABS
1.0000 | ORAL_TABLET | Freq: Every day | ORAL | Status: DC
Start: 2015-02-27 — End: 2015-09-19

## 2015-02-27 NOTE — Telephone Encounter (Signed)
Negative (benign) mammogram July 2016; last labs reviewed (CBC, CMP, lipids); status post hysterectomy Rx approved

## 2015-02-27 NOTE — Telephone Encounter (Signed)
Routing to provider  

## 2015-03-19 ENCOUNTER — Telehealth: Payer: Self-pay

## 2015-03-19 NOTE — Telephone Encounter (Signed)
-----   Message from Arnetha Courser, MD sent at 03/06/2015  4:40 PM EDT ----- Regarding: Overdue DEXA   ----- Message -----    From: SYSTEM    Sent: 10/28/2014  12:05 AM      To: Arnetha Courser, MD

## 2015-03-19 NOTE — Telephone Encounter (Signed)
Patient reminded that she was due for er Dexa and Mammogram. She said she's been going to [facility] and would like to continue going there. Orders faxed.

## 2015-04-07 ENCOUNTER — Encounter: Payer: Self-pay | Admitting: Family Medicine

## 2015-04-07 ENCOUNTER — Ambulatory Visit (INDEPENDENT_AMBULATORY_CARE_PROVIDER_SITE_OTHER): Payer: PPO | Admitting: Family Medicine

## 2015-04-07 VITALS — BP 138/72 | HR 66 | Temp 97.7°F | Ht 64.3 in | Wt 135.0 lb

## 2015-04-07 DIAGNOSIS — L259 Unspecified contact dermatitis, unspecified cause: Secondary | ICD-10-CM | POA: Diagnosis not present

## 2015-04-07 DIAGNOSIS — B354 Tinea corporis: Secondary | ICD-10-CM | POA: Diagnosis not present

## 2015-04-07 MED ORDER — TRIAMCINOLONE ACETONIDE 0.5 % EX OINT: 1.0000 "application " | TOPICAL_OINTMENT | Freq: Two times a day (BID) | CUTANEOUS | Status: DC

## 2015-04-07 MED ORDER — KETOCONAZOLE 2 % EX CREA: 1.0000 "application " | TOPICAL_CREAM | Freq: Every day | CUTANEOUS | Status: DC

## 2015-04-07 NOTE — Patient Instructions (Signed)

## 2015-04-07 NOTE — Progress Notes (Signed)
BP 138/72 mmHg  Pulse 66  Temp(Src) 97.7 F (36.5 C)  Ht 5' 4.3" (1.633 m)  Wt 135 lb (61.236 kg)  BMI 22.96 kg/m2  SpO2 97%   Subjective:    Patient ID: [name], female    DOB: [birth year], 69 y.o.   MRN: [id number]  HPI: [name] is a 69 y.o. female  Chief Complaint  Patient presents with  . Tinea   RASH Duration:  2 weeks on the leg, about 1 week for her foot   Location: face and legs  Itching: yes Burning: no Redness: yes Oozing: yes Scaling: yes Blisters: no Painful: no Fevers: no Change in detergents/soaps/personal care products: no Recent illness: no Recent travel:yes History of same: yes Context: contacts with the same Alleviating factors: benadryl Treatments attempted:benadryl Shortness of breath: no  Throat/tongue swelling: no Myalgias/arthralgias: no  Relevant past medical, surgical, family and social history reviewed and updated as indicated. Interim medical history since our last visit reviewed. Allergies and medications reviewed and updated.  Review of Systems  Constitutional: Negative.   Respiratory: Negative.   Cardiovascular: Negative.   Skin: Positive for rash. Negative for color change, pallor and wound.  Psychiatric/Behavioral: Negative.     Per HPI unless specifically indicated above     Objective:    BP 138/72 mmHg  Pulse 66  Temp(Src) 97.7 F (36.5 C)  Ht 5' 4.3" (1.633 m)  Wt 135 lb (61.236 kg)  BMI 22.96 kg/m2  SpO2 97%  Wt Readings from Last 3 Encounters:  04/07/15 135 lb (61.236 kg)  10/27/14 134 lb (60.782 kg)  04/25/14 137 lb (62.143 kg)    Physical Exam  Constitutional: She is oriented to person, place, and time. She appears well-developed and well-nourished. No distress.  HENT:  Head: Normocephalic and atraumatic.  Right Ear: Hearing normal.  Left Ear: Hearing normal.  Nose: Nose normal.  Eyes: Conjunctivae and lids are normal. Right eye exhibits no discharge. Left eye exhibits no discharge. No  scleral icterus.  Pulmonary/Chest: Effort normal. No respiratory distress.  Musculoskeletal: Normal range of motion.  Neurological: She is alert and oriented to person, place, and time.  Skin: Skin is warm, dry and intact. Rash noted. No erythema. No pallor.  Area of raised, excoriated lesions on the R anterior shin, numerous maculopapular erythematous lesions from anterior ankle to mid shin.  Area of erythematous pustules with central clearing on lateral L ankle about 2 inches in diameter Area of erythematous pustules with central clearing on anterior chin about 1 inch in diameter  Psychiatric: She has a normal mood and affect. Her speech is normal and behavior is normal. Judgment and thought content normal. Cognition and memory are normal.  Nursing note and vitals reviewed.   Results for orders placed or performed in visit on 10/27/14  Comprehensive metabolic panel  Result Value Ref Range   Glucose 79 65 - 99 mg/dL   BUN 16 8 - 27 mg/dL   Creatinine, Ser 0.80 0.57 - 1.00 mg/dL   GFR calc non Af Amer 76 >59 mL/min/1.73   GFR calc Af Amer 88 >59 mL/min/1.73   BUN/Creatinine Ratio 20 11 - 26   Sodium 140 134 - 144 mmol/L   Potassium 4.6 3.5 - 5.2 mmol/L   Chloride 100 97 - 108 mmol/L   CO2 27 18 - 29 mmol/L   Calcium 9.7 8.7 - 10.3 mg/dL   Total Protein 6.2 6.0 - 8.5 g/dL   Albumin 4.1 3.6 - 4.8 g/dL  Globulin, Total 2.1 1.5 - 4.5 g/dL   Albumin/Globulin Ratio 2.0 1.1 - 2.5   Bilirubin Total 0.3 0.0 - 1.2 mg/dL   Alkaline Phosphatase 50 39 - 117 IU/L   AST 21 0 - 40 IU/L   ALT 14 0 - 32 IU/L  Lipid Panel w/o Chol/HDL Ratio  Result Value Ref Range   Cholesterol, Total 221 (H) 100 - 199 mg/dL   Triglycerides 43 0 - 149 mg/dL   HDL 79 >39 mg/dL   VLDL Cholesterol Cal 9 5 - 40 mg/dL   LDL Calculated 133 (H) 0 - 99 mg/dL  CBC with Differential  Result Value Ref Range   WBC 5.8 3.4 - 10.8 x10E3/uL   RBC 4.41 3.77 - 5.28 x10E6/uL   Hemoglobin 14.3 11.1 - 15.9 g/dL   Hematocrit  41.0 34.0 - 46.6 %   MCV 93 79 - 97 fL   MCH 32.4 26.6 - 33.0 pg   MCHC 34.9 31.5 - 35.7 g/dL   RDW 13.3 12.3 - 15.4 %   Platelets 207 150 - 379 x10E3/uL   Neutrophils 59 %   Lymphs 26 %   Monocytes 12 %   Eos 2 %   Basos 1 %   Neutrophils Absolute 3.5 1.4 - 7.0 x10E3/uL   Lymphocytes Absolute 1.5 0.7 - 3.1 x10E3/uL   Monocytes Absolute 0.7 0.1 - 0.9 x10E3/uL   EOS (ABSOLUTE) 0.1 0.0 - 0.4 x10E3/uL   Basophils Absolute 0.0 0.0 - 0.2 x10E3/uL   Immature Granulocytes 0 %   Immature Grans (Abs) 0.0 0.0 - 0.1 x10E3/uL  UA/M w/rflx Culture, Routine  Result Value Ref Range   Specific Gravity, UA 1.015 1.005 - 1.030   pH, UA 7.0 5.0 - 7.5   Color, UA Yellow Yellow   Appearance Ur Cloudy (A) Clear   Leukocytes, UA Negative Negative   Protein, UA Negative Negative/Trace   Glucose, UA Negative Negative   Ketones, UA Negative Negative   RBC, UA Negative Negative   Bilirubin, UA Negative Negative   Urobilinogen, Ur 0.2 0.2 - 1.0 mg/dL   Nitrite, UA Negative Negative      Assessment & Plan:   Problem List Items Addressed This Visit    None    Visit Diagnoses    Tinea corporis    -  Primary    Will treat with ketokonazole cream and triamcinalone for the itching. Continue to monitor. Call if not getting better or getting worse.     Relevant Medications    ketoconazole (NIZORAL) 2 % cream    Contact dermatitis        Will treat area on the shin with triamcinalone. Call if not getting better or getting worse.         Follow up plan: Return if symptoms worsen or fail to improve.

## 2015-04-13 ENCOUNTER — Ambulatory Visit (INDEPENDENT_AMBULATORY_CARE_PROVIDER_SITE_OTHER): Payer: PPO | Admitting: Family Medicine

## 2015-04-13 ENCOUNTER — Encounter: Payer: Self-pay | Admitting: Family Medicine

## 2015-04-13 VITALS — BP 129/79 | HR 64 | Temp 97.6°F | Ht 65.0 in | Wt 136.0 lb

## 2015-04-13 DIAGNOSIS — M5442 Lumbago with sciatica, left side: Secondary | ICD-10-CM | POA: Diagnosis not present

## 2015-04-13 DIAGNOSIS — M545 Low back pain, unspecified: Secondary | ICD-10-CM | POA: Insufficient documentation

## 2015-04-13 LAB — URINALYSIS, ROUTINE W REFLEX MICROSCOPIC
Bilirubin, UA: NEGATIVE
GLUCOSE, UA: NEGATIVE
KETONES UA: NEGATIVE
Leukocytes, UA: NEGATIVE
Nitrite, UA: NEGATIVE
PROTEIN UA: NEGATIVE
RBC, UA: NEGATIVE
Specific Gravity, UA: 1.01 (ref 1.005–1.030)
Urobilinogen, Ur: 0.2 mg/dL (ref 0.2–1.0)
pH, UA: 7.5 (ref 5.0–7.5)

## 2015-04-13 MED ORDER — PREDNISONE 10 MG PO TABS: ORAL_TABLET | ORAL | Status: DC

## 2015-04-13 NOTE — Assessment & Plan Note (Signed)
Patient urinalysis normal with no evidence of infection or kidney stone Back pain with normal neurological muscle skeletal exam, history consistent with lumbar disc Patient's been taking extra Aleve and Celebrex with no relief We will stop nonsteroidals begin prednisone 10 mg 5 a day for 5 days Patient has appointment with orthopedics next week will keep that appointment Discussed modified work and exercise

## 2015-04-13 NOTE — Progress Notes (Signed)
BP 129/79 mmHg  Pulse 64  Temp(Src) 97.6 F (36.4 C)  Ht 5\' 5"  (1.651 m)  Wt 136 lb (61.689 kg)  BMI 22.63 kg/m2  SpO2 99%   Subjective:    Patient ID: [name], female    DOB: [birth year], 69 y.o.   MRN: [id number]  HPI: [name] is a 69 y.o. female  Chief Complaint  Patient presents with  . Back Pain    low back, left side   Patient with back pain started Thanksgiving after a bout of diarrhea radiation into left posterior around her anterior leg this is interfered with sleep over the last 2 nights. No blood in stool or urine diarrhea has since resolved no frequency urgency or dysuria No known specific trauma leg lifting trauma Patient is back from [place] she spent the last 2 weeks No swelling in leg Relevant past medical, surgical, family and social history reviewed and updated as indicated. Interim medical history since our last visit reviewed. Allergies and medications reviewed and updated.  Review of Systems  Constitutional: Negative.   Respiratory: Negative.   Cardiovascular: Negative.     Per HPI unless specifically indicated above     Objective:    BP 129/79 mmHg  Pulse 64  Temp(Src) 97.6 F (36.4 C)  Ht 5\' 5"  (1.651 m)  Wt 136 lb (61.689 kg)  BMI 22.63 kg/m2  SpO2 99%  Wt Readings from Last 3 Encounters:  04/13/15 136 lb (61.689 kg)  04/07/15 135 lb (61.236 kg)  10/27/14 134 lb (60.782 kg)    Physical Exam  Constitutional: She is oriented to person, place, and time. She appears well-developed and well-nourished. No distress.  HENT:  Head: Normocephalic and atraumatic.  Right Ear: Hearing normal.  Left Ear: Hearing normal.  Nose: Nose normal.  Eyes: Conjunctivae and lids are normal. Right eye exhibits no discharge. Left eye exhibits no discharge. No scleral icterus.  Pulmonary/Chest: Effort normal. No respiratory distress.  Musculoskeletal: Normal range of motion.  Straight leg raising normal Skin with no evidence of rash or  shingles Deep tendon reflexes normal Neurological exam normal with normal musculoskeletal strength No leg weakness no decreased sensation to touch.  Neurological: She is alert and oriented to person, place, and time.  Skin: Skin is intact. No rash noted.  Psychiatric: She has a normal mood and affect. Her speech is normal and behavior is normal. Judgment and thought content normal. Cognition and memory are normal.    Results for orders placed or performed in visit on 10/27/14  Comprehensive metabolic panel  Result Value Ref Range   Glucose 79 65 - 99 mg/dL   BUN 16 8 - 27 mg/dL   Creatinine, Ser 0.80 0.57 - 1.00 mg/dL   GFR calc non Af Amer 76 >59 mL/min/1.73   GFR calc Af Amer 88 >59 mL/min/1.73   BUN/Creatinine Ratio 20 11 - 26   Sodium 140 134 - 144 mmol/L   Potassium 4.6 3.5 - 5.2 mmol/L   Chloride 100 97 - 108 mmol/L   CO2 27 18 - 29 mmol/L   Calcium 9.7 8.7 - 10.3 mg/dL   Total Protein 6.2 6.0 - 8.5 g/dL   Albumin 4.1 3.6 - 4.8 g/dL   Globulin, Total 2.1 1.5 - 4.5 g/dL   Albumin/Globulin Ratio 2.0 1.1 - 2.5   Bilirubin Total 0.3 0.0 - 1.2 mg/dL   Alkaline Phosphatase 50 39 - 117 IU/L   AST 21 0 - 40 IU/L   ALT  14 0 - 32 IU/L  Lipid Panel w/o Chol/HDL Ratio  Result Value Ref Range   Cholesterol, Total 221 (H) 100 - 199 mg/dL   Triglycerides 43 0 - 149 mg/dL   HDL 79 >39 mg/dL   VLDL Cholesterol Cal 9 5 - 40 mg/dL   LDL Calculated 133 (H) 0 - 99 mg/dL  CBC with Differential  Result Value Ref Range   WBC 5.8 3.4 - 10.8 x10E3/uL   RBC 4.41 3.77 - 5.28 x10E6/uL   Hemoglobin 14.3 11.1 - 15.9 g/dL   Hematocrit 41.0 34.0 - 46.6 %   MCV 93 79 - 97 fL   MCH 32.4 26.6 - 33.0 pg   MCHC 34.9 31.5 - 35.7 g/dL   RDW 13.3 12.3 - 15.4 %   Platelets 207 150 - 379 x10E3/uL   Neutrophils 59 %   Lymphs 26 %   Monocytes 12 %   Eos 2 %   Basos 1 %   Neutrophils Absolute 3.5 1.4 - 7.0 x10E3/uL   Lymphocytes Absolute 1.5 0.7 - 3.1 x10E3/uL   Monocytes Absolute 0.7 0.1 - 0.9  x10E3/uL   EOS (ABSOLUTE) 0.1 0.0 - 0.4 x10E3/uL   Basophils Absolute 0.0 0.0 - 0.2 x10E3/uL   Immature Granulocytes 0 %   Immature Grans (Abs) 0.0 0.0 - 0.1 x10E3/uL  UA/M w/rflx Culture, Routine  Result Value Ref Range   Specific Gravity, UA 1.015 1.005 - 1.030   pH, UA 7.0 5.0 - 7.5   Color, UA Yellow Yellow   Appearance Ur Cloudy (A) Clear   Leukocytes, UA Negative Negative   Protein, UA Negative Negative/Trace   Glucose, UA Negative Negative   Ketones, UA Negative Negative   RBC, UA Negative Negative   Bilirubin, UA Negative Negative   Urobilinogen, Ur 0.2 0.2 - 1.0 mg/dL   Nitrite, UA Negative Negative      Assessment & Plan:   Problem List Items Addressed This Visit      Other   Low back pain - Primary    Patient urinalysis normal with no evidence of infection or kidney stone Back pain with normal neurological muscle skeletal exam, history consistent with lumbar disc Patient's been taking extra Aleve and Celebrex with no relief We will stop nonsteroidals begin prednisone 10 mg 5 a day for 5 days Patient has appointment with orthopedics next week will keep that appointment Discussed modified work and exercise      Relevant Medications   predniSONE (DELTASONE) 10 MG tablet   Other Relevant Orders   Urinalysis, Routine w reflex microscopic (not at [facility])       Follow up plan: Return if symptoms worsen or fail to improve, for As scheduled.

## 2015-04-22 ENCOUNTER — Encounter: Payer: Self-pay | Admitting: Family Medicine

## 2015-04-22 ENCOUNTER — Ambulatory Visit (INDEPENDENT_AMBULATORY_CARE_PROVIDER_SITE_OTHER): Payer: PPO | Admitting: Family Medicine

## 2015-04-22 VITALS — BP 133/77 | HR 66 | Temp 98.3°F | Ht 65.0 in | Wt 138.0 lb

## 2015-04-22 DIAGNOSIS — B029 Zoster without complications: Secondary | ICD-10-CM | POA: Diagnosis not present

## 2015-04-22 MED ORDER — OXYCODONE-ACETAMINOPHEN 5-325 MG PO TABS: 1.0000 | ORAL_TABLET | Freq: Four times a day (QID) | ORAL | Status: DC | PRN

## 2015-04-22 NOTE — Assessment & Plan Note (Signed)
Discussed extensively shingles care and treatment Discussed possibility of future pain Discussed pain treatment now gave patient another prescription for Percocet with cautions about constipation Cautions about garding medicines

## 2015-04-22 NOTE — Progress Notes (Signed)
BP 133/77 mmHg  Pulse 66  Temp(Src) 98.3 F (36.8 C)  Ht 5\' 5"  (1.651 m)  Wt 138 lb (62.596 kg)  BMI 22.96 kg/m2  SpO2 99%   Subjective:    Patient ID: [name], female    DOB: 01-27-1946, 69 y.o.   MRN: [id number]  HPI: [name] is a 69 y.o. female  Chief Complaint  Patient presents with  . Herpes Zoster   After office visit here patient developed rash in area of pain and was diagnosed with shingles treated with Valtrex and pain medication. Asian still having a lot of pain about to run out of Percocet Otherwise rash may be showing some improvement and not spreading. Patient's been under a lot of stress this last month Prednisone given above did not seem to help  Relevant past medical, surgical, family and social history reviewed and updated as indicated. Interim medical history since our last visit reviewed. Allergies and medications reviewed and updated.  Review of Systems  Constitutional: Positive for fatigue.  Respiratory: Negative.   Cardiovascular: Negative.     Per HPI unless specifically indicated above     Objective:    BP 133/77 mmHg  Pulse 66  Temp(Src) 98.3 F (36.8 C)  Ht 5\' 5"  (1.651 m)  Wt 138 lb (62.596 kg)  BMI 22.96 kg/m2  SpO2 99%  Wt Readings from Last 3 Encounters:  04/22/15 138 lb (62.596 kg)  04/13/15 136 lb (61.689 kg)  04/07/15 135 lb (61.236 kg)    Physical Exam  Constitutional: She is oriented to person, place, and time. She appears well-developed and well-nourished. No distress.  HENT:  Head: Normocephalic and atraumatic.  Right Ear: Hearing normal.  Left Ear: Hearing normal.  Nose: Nose normal.  Eyes: Conjunctivae and lids are normal. Right eye exhibits no discharge. Left eye exhibits no discharge. No scleral icterus.  Cardiovascular: Normal rate, regular rhythm and normal heart sounds.   Pulmonary/Chest: Effort normal and breath sounds normal. No respiratory distress.  Musculoskeletal: Normal range of  motion.  Neurological: She is alert and oriented to person, place, and time.  Skin: Skin is intact. No rash noted.  Did not look at leg rash reported by patient improving  Psychiatric: She has a normal mood and affect. Her speech is normal and behavior is normal. Judgment and thought content normal. Cognition and memory are normal.    Results for orders placed or performed in visit on 04/13/15  Urinalysis, Routine w reflex microscopic (not at [facility])  Result Value Ref Range   Specific Gravity, UA 1.010 1.005 - 1.030   pH, UA 7.5 5.0 - 7.5   Color, UA Yellow Yellow   Appearance Ur Clear Clear   Leukocytes, UA Negative Negative   Protein, UA Negative Negative/Trace   Glucose, UA Negative Negative   Ketones, UA Negative Negative   RBC, UA Negative Negative   Bilirubin, UA Negative Negative   Urobilinogen, Ur 0.2 0.2 - 1.0 mg/dL   Nitrite, UA Negative Negative      Assessment & Plan:   Problem List Items Addressed This Visit      Other   Shingles rash - Primary    Discussed extensively shingles care and treatment Discussed possibility of future pain Discussed pain treatment now gave patient another prescription for Percocet with cautions about constipation Cautions about garding medicines      Relevant Medications   valACYclovir (VALTREX) 1000 MG tablet       Follow up plan: Return  for As scheduled.

## 2015-05-04 ENCOUNTER — Telehealth: Payer: Self-pay | Admitting: Family Medicine

## 2015-05-04 ENCOUNTER — Other Ambulatory Visit: Payer: Self-pay | Admitting: Family Medicine

## 2015-05-04 MED ORDER — TRAMADOL HCL 50 MG PO TABS: 50.0000 mg | ORAL_TABLET | Freq: Three times a day (TID) | ORAL | Status: DC | PRN

## 2015-05-04 NOTE — Telephone Encounter (Signed)
Pt called stated [facility] has been treating her for shingles, pt stated she needs refill on the pain medication. Pharm is [facility]. Thanks.

## 2015-05-15 ENCOUNTER — Ambulatory Visit (INDEPENDENT_AMBULATORY_CARE_PROVIDER_SITE_OTHER): Payer: PPO | Admitting: Family Medicine

## 2015-05-15 ENCOUNTER — Encounter: Payer: Self-pay | Admitting: Family Medicine

## 2015-05-15 VITALS — BP 135/71 | HR 80 | Temp 97.4°F | Wt 131.0 lb

## 2015-05-15 DIAGNOSIS — L309 Dermatitis, unspecified: Secondary | ICD-10-CM

## 2015-05-15 DIAGNOSIS — B0229 Other postherpetic nervous system involvement: Secondary | ICD-10-CM | POA: Diagnosis not present

## 2015-05-15 MED ORDER — GABAPENTIN 300 MG PO CAPS: ORAL_CAPSULE | ORAL | Status: DC

## 2015-05-15 MED ORDER — OXYCODONE-ACETAMINOPHEN 5-325 MG PO TABS: 1.0000 | ORAL_TABLET | Freq: Four times a day (QID) | ORAL | Status: DC | PRN

## 2015-05-15 MED ORDER — CLOBETASOL PROPIONATE 0.05 % EX CREA: 1.0000 "application " | TOPICAL_CREAM | Freq: Two times a day (BID) | CUTANEOUS | Status: DC

## 2015-05-15 NOTE — Assessment & Plan Note (Signed)
Start gabapentin; use oxycodone if needed; no red flags; cautions given, call in 2 weeks if not improving for increase of the gabapentin; never stop abruptly

## 2015-05-15 NOTE — Patient Instructions (Signed)
Start the gabapentin Use the oxycodone if needed Use the new cream Try claritin (plain, not the one with decongestant) for itching Call me if needed

## 2015-05-15 NOTE — Progress Notes (Signed)
BP 135/71 mmHg  Pulse 80  Temp(Src) 97.4 F (36.3 C)  Wt 131 lb (59.421 kg)  SpO2 98%   Subjective:    Patient ID: [name], female    DOB: 05-Nov-1945, 69 y.o.   MRN: [id number]  HPI: [name] is a 69 y.o. female  Chief Complaint  Patient presents with  . Herpes Zoster    Still having Shingles outbreak on torso.   She broke out on her left side of the back; severe back pain, wrapped around and into the groin; symptoms started over Thanksgiving; started with severe back pain; prednisone and then saw her back doctor and she recognized what it was, started shingles medicine and percocet; then saw Dr. Jeananne Rama the next time; then she got tramadol; tramadol did nothing; she wants something for the nerve pain; lesions are all gone, still having post-zoster neuralgia; no red flags; does not drink alcohol; pain 7 or 8 out of 10 when bad  Another rash on the medial right arm; on elbow, side; 2-3 weeks; eblows; very itchy; scratches and gets bright red  Relevant past medical, surgical, family and social history reviewed and updated as indicated. Interim medical history since our last visit reviewed. Allergies and medications reviewed and updated.  Review of Systems Per HPI unless specifically indicated above     Objective:    BP 135/71 mmHg  Pulse 80  Temp(Src) 97.4 F (36.3 C)  Wt 131 lb (59.421 kg)  SpO2 98%  Wt Readings from Last 3 Encounters:  05/15/15 131 lb (59.421 kg)  04/22/15 138 lb (62.596 kg)  04/13/15 136 lb (61.689 kg)    Physical Exam  Constitutional: She appears well-developed and well-nourished.  Eyes: No scleral icterus.  Cardiovascular: Normal rate.   Pulmonary/Chest: Effort normal.  Neurological: She is alert.  Skin: Rash noted.  Erythematous rash on the left shin, left arm, no vesicles; no linear pattern; no pustules; not dermatomal distribution  Psychiatric: She has a normal mood and affect. Her behavior is normal.   Results for orders  placed or performed in visit on 04/13/15  Urinalysis, Routine w reflex microscopic (not at [facility])  Result Value Ref Range   Specific Gravity, UA 1.010 1.005 - 1.030   pH, UA 7.5 5.0 - 7.5   Color, UA Yellow Yellow   Appearance Ur Clear Clear   Leukocytes, UA Negative Negative   Protein, UA Negative Negative/Trace   Glucose, UA Negative Negative   Ketones, UA Negative Negative   RBC, UA Negative Negative   Bilirubin, UA Negative Negative   Urobilinogen, Ur 0.2 0.2 - 1.0 mg/dL   Nitrite, UA Negative Negative      Assessment & Plan:   Problem List Items Addressed This Visit      Nervous and Auditory   Postzoster neuralgia - Primary    Start gabapentin; use oxycodone if needed; no red flags; cautions given, call in 2 weeks if not improving for increase of the gabapentin; never stop abruptly      Relevant Medications   gabapentin (NEURONTIN) 300 MG capsule     Musculoskeletal and Integument   Dermatitis    Use anti-histamine and topical cream; cream too strong for face, groin, axilla; may be stress-related         Follow up plan: Return if symptoms worsen or fail to improve.  Meds ordered this encounter  Medications  . DISCONTD: oxyCODONE-acetaminophen (ROXICET) 5-325 MG tablet    Sig: Take 1-2 tablets by mouth every 6 (  six) hours as needed for severe pain.    Dispense:  60 tablet    Refill:  0  . DISCONTD: gabapentin (NEURONTIN) 300 MG capsule    Sig: One by mouth at bedtime for 3 night, then one BID for 3 days, then one TID    Dispense:  90 capsule    Refill:  0  . DISCONTD: clobetasol cream (TEMOVATE) 0.05 %    Sig: Apply 1 application topically 2 (two) times daily. Too strong for face, groin, axilla, children    Dispense:  60 g    Refill:  0  . oxyCODONE-acetaminophen (ROXICET) 5-325 MG tablet    Sig: Take 1-2 tablets by mouth every 6 (six) hours as needed for severe pain.    Dispense:  60 tablet    Refill:  0  . clobetasol cream (TEMOVATE) 0.05 %    Sig: Apply  1 application topically 2 (two) times daily. Too strong for face, groin, axilla, children    Dispense:  60 g    Refill:  0  . gabapentin (NEURONTIN) 300 MG capsule    Sig: One by mouth at bedtime for 3 night, then one BID for 3 days, then one TID    Dispense:  90 capsule    Refill:  0   An after-visit summary was printed and given to the patient at [place].  Please see the patient instructions which may contain other information and recommendations beyond what is mentioned above in the assessment and plan.

## 2015-05-16 NOTE — Assessment & Plan Note (Signed)
Use anti-histamine and topical cream; cream too strong for face, groin, axilla; may be stress-related

## 2015-05-27 DIAGNOSIS — D0472 Carcinoma in situ of skin of left lower limb, including hip: Secondary | ICD-10-CM | POA: Diagnosis not present

## 2015-05-27 DIAGNOSIS — L309 Dermatitis, unspecified: Secondary | ICD-10-CM | POA: Diagnosis not present

## 2015-05-27 DIAGNOSIS — D485 Neoplasm of uncertain behavior of skin: Secondary | ICD-10-CM | POA: Diagnosis not present

## 2015-06-04 ENCOUNTER — Telehealth: Payer: Self-pay | Admitting: Family Medicine

## 2015-06-04 NOTE — Telephone Encounter (Signed)
Pt called to inquire about the envelope she dropped off for Dr. Sanda Klein regarding an insurance claim. Pt stated she would like a call back concerning the contents. Please call her ASAP. Pt willing to schedule an appt if needed. Thanks.

## 2015-06-08 NOTE — Telephone Encounter (Signed)
I took care of this already and gave it back to you or another CMA; she does not need an appt

## 2015-06-08 NOTE — Telephone Encounter (Signed)
Dr. Sanda Klein, do you know the status of this?

## 2015-06-10 ENCOUNTER — Telehealth: Payer: Self-pay | Admitting: Family Medicine

## 2015-06-10 MED ORDER — GABAPENTIN 300 MG PO CAPS: ORAL_CAPSULE | ORAL | Status: DC

## 2015-06-10 NOTE — Telephone Encounter (Signed)
Pt called in and says she is still experiencing pain from shingles. The gabapentin helped but she is almost out of it and she would like to know her what other options she has.

## 2015-06-10 NOTE — Telephone Encounter (Signed)
Patient notified

## 2015-06-10 NOTE — Telephone Encounter (Signed)
Please let patient know that we'll increase the bedtime dose from 300 mg to 600 mg (two pills); I sent new Rx Give that a week or so and let me know if we need to adjust When her symptoms improve and she's ready to think about stopping this, do NOT every stop cold [place] We'll wean her back down little by little, just like we tapered up

## 2015-06-10 NOTE — Telephone Encounter (Signed)
Routing to provider for advice.

## 2015-06-22 ENCOUNTER — Telehealth: Payer: Self-pay | Admitting: Family Medicine

## 2015-06-22 NOTE — Telephone Encounter (Signed)
Patient called stating that she spoke with someone on the phone and told her they put the paperwork on Dr. Sanda Klein had but when husband spoke with her Dr. Sanda Klein told him that she had not seen it. The paperwork is an Insurance claim to verify when she was sick. [employer] is the name of the insurance.

## 2015-06-22 NOTE — Telephone Encounter (Signed)
Please see phone note from Jan 19th; I filled the one page form out and returned it to someone; I don't know what became of it, but I finished my part

## 2015-06-22 NOTE — Telephone Encounter (Signed)
Dr. Sanda Klein, do you know anything about this?

## 2015-06-26 DIAGNOSIS — D0472 Carcinoma in situ of skin of left lower limb, including hip: Secondary | ICD-10-CM | POA: Diagnosis not present

## 2015-07-16 ENCOUNTER — Other Ambulatory Visit: Payer: Self-pay

## 2015-07-16 MED ORDER — GABAPENTIN 300 MG PO CAPS: ORAL_CAPSULE | ORAL | Status: DC

## 2015-07-16 NOTE — Telephone Encounter (Signed)
Routing to provider to refill

## 2015-08-07 ENCOUNTER — Telehealth: Payer: Self-pay | Admitting: Family Medicine

## 2015-08-07 NOTE — Telephone Encounter (Signed)
Pt states she scheduled her mammogram 01/11/2016 and pt is requesting to get Bone Density on the same day. Please advise pt.

## 2015-08-10 NOTE — Telephone Encounter (Signed)
Will scheduled at patients next f/u

## 2015-08-12 DIAGNOSIS — M7061 Trochanteric bursitis, right hip: Secondary | ICD-10-CM | POA: Diagnosis not present

## 2015-08-12 DIAGNOSIS — M7062 Trochanteric bursitis, left hip: Secondary | ICD-10-CM | POA: Diagnosis not present

## 2015-09-07 ENCOUNTER — Telehealth: Payer: Self-pay | Admitting: Family Medicine

## 2015-09-07 NOTE — Telephone Encounter (Signed)
FYI: Patient called this morning with fever and possible stomach bug. [place]. I did inform patient that we where completely booked and then informed her that she may have to go to urgent care or ER. I also took name down just in case we had cancellation, however we have not had any cancellations and no one has anything available this week

## 2015-09-08 ENCOUNTER — Encounter: Payer: Self-pay | Admitting: Unknown Physician Specialty

## 2015-09-08 ENCOUNTER — Ambulatory Visit (INDEPENDENT_AMBULATORY_CARE_PROVIDER_SITE_OTHER): Payer: PPO | Admitting: Unknown Physician Specialty

## 2015-09-08 VITALS — BP 157/92 | HR 65 | Temp 97.5°F | Ht 65.0 in | Wt 136.0 lb

## 2015-09-08 DIAGNOSIS — A09 Infectious gastroenteritis and colitis, unspecified: Secondary | ICD-10-CM | POA: Diagnosis not present

## 2015-09-08 MED ORDER — CIPROFLOXACIN HCL 500 MG PO TABS: 500.0000 mg | ORAL_TABLET | Freq: Two times a day (BID) | ORAL | Status: DC

## 2015-09-08 NOTE — Progress Notes (Signed)
   BP 157/92 mmHg  Pulse 65  Temp(Src) 97.5 F (36.4 C)  Ht 5\' 5"  (1.651 m)  Wt 136 lb (61.689 kg)  BMI 22.63 kg/m2  SpO2 99%  LMP  (LMP Unknown)   Subjective:    Patient ID: [name], female    DOB: [birth year], 70 y.o.   MRN: [id number]  HPI: [name] is a 70 y.o. female  Chief Complaint  Patient presents with  . Abdominal Pain    pt states she has been having diarrhea and vomiting that started last Wednesday.    Abdominal Pain This is a new (This started about 1 week ago.  Got back from [place] a day before this started.  Severe symptoms are improved but just not feeling well.  Still having "yucky poop."  About 3 times today so far) problem. The pain is located in the generalized abdominal region. The pain is mild. The quality of the pain is aching. The abdominal pain does not radiate. Associated symptoms include anorexia, belching, diarrhea, flatus and nausea. Pertinent negatives include no fever. Nothing (laying down) aggravates the pain. The pain is relieved by recumbency. She has tried nothing for the symptoms.   Usually travels with Cipro but didn't have any  Relevant past medical, surgical, family and social history reviewed and updated as indicated. Interim medical history since our last visit reviewed. Allergies and medications reviewed and updated.  Review of Systems  Constitutional: Negative for fever.  Gastrointestinal: Positive for nausea, abdominal pain, diarrhea, anorexia and flatus.    Per HPI unless specifically indicated above     Objective:    BP 157/92 mmHg  Pulse 65  Temp(Src) 97.5 F (36.4 C)  Ht 5\' 5"  (1.651 m)  Wt 136 lb (61.689 kg)  BMI 22.63 kg/m2  SpO2 99%  LMP  (LMP Unknown)  Wt Readings from Last 3 Encounters:  09/08/15 136 lb (61.689 kg)  05/15/15 131 lb (59.421 kg)  04/22/15 138 lb (62.596 kg)    Physical Exam  Constitutional: She is oriented to person, place, and time. She appears well-developed and well-nourished.  No distress.  HENT:  Head: Normocephalic and atraumatic.  Eyes: Conjunctivae and lids are normal. Right eye exhibits no discharge. Left eye exhibits no discharge. No scleral icterus.  Neck: Normal range of motion. Neck supple. No JVD present. Carotid bruit is not present.  Cardiovascular: Normal rate, regular rhythm and normal heart sounds.   Pulmonary/Chest: Effort normal and breath sounds normal.  Abdominal: Normal appearance. There is no splenomegaly or hepatomegaly. There is generalized tenderness. There is no rigidity, no rebound and no guarding.  Musculoskeletal: Normal range of motion.  Neurological: She is alert and oriented to person, place, and time.  Skin: Skin is warm, dry and intact. No rash noted. No pallor.  Psychiatric: She has a normal mood and affect. Her behavior is normal. Judgment and thought content normal.       Assessment & Plan:   Problem List Items Addressed This Visit      Unprioritized   Diarrhea, travelers' - Primary       Follow up plan: Return if symptoms worsen or fail to improve.

## 2015-09-19 ENCOUNTER — Other Ambulatory Visit: Payer: Self-pay | Admitting: Family Medicine

## 2015-09-24 ENCOUNTER — Other Ambulatory Visit: Payer: Self-pay | Admitting: Family Medicine

## 2015-09-25 ENCOUNTER — Other Ambulatory Visit: Payer: Self-pay | Admitting: Family Medicine

## 2015-09-25 DIAGNOSIS — Z13820 Encounter for screening for osteoporosis: Secondary | ICD-10-CM

## 2015-09-25 DIAGNOSIS — Z780 Asymptomatic menopausal state: Secondary | ICD-10-CM

## 2015-09-25 MED ORDER — EST ESTROGENS-METHYLTEST 1.25-2.5 MG PO TABS: 1.0000 | ORAL_TABLET | Freq: Every day | ORAL | Status: DC

## 2015-09-25 NOTE — Telephone Encounter (Signed)
Okay, thank you Mammo UTD

## 2015-09-25 NOTE — Telephone Encounter (Signed)
Patient is requesting refill on Estrogen. Requesting that the prescription be sent to [facility]. It looks as if [facility] wrote her a prescription but patient states she is not trying to change providers and that the pharmacy does not have the prescription. She is requesting a return call once this is complete. She only have 3 pills left.

## 2015-09-25 NOTE — Telephone Encounter (Signed)
She is going to be coming to you

## 2015-09-25 NOTE — Telephone Encounter (Signed)
Patent was seen at [facility] since I left; please find out if she's staying there or going to be seen here; thank you

## 2015-10-07 DIAGNOSIS — Z13820 Encounter for screening for osteoporosis: Secondary | ICD-10-CM | POA: Insufficient documentation

## 2015-10-07 DIAGNOSIS — Z780 Asymptomatic menopausal state: Secondary | ICD-10-CM | POA: Insufficient documentation

## 2015-10-07 NOTE — Telephone Encounter (Signed)
This was already done on 09/25/15; I see that she wanted a call back; I left msg that I took care of this back on May 12th, hope she has received the Rx already I was in her chart ordering her DEXA which she can have done on or after June 5th

## 2015-10-07 NOTE — Telephone Encounter (Signed)
-----   Message from Arnetha Courser, MD sent at 04/04/2015  5:47 PM EST ----- Regarding: Due for DEXA June 2017 Enter orders

## 2015-10-28 ENCOUNTER — Encounter: Payer: Self-pay | Admitting: Family Medicine

## 2015-10-28 ENCOUNTER — Ambulatory Visit (INDEPENDENT_AMBULATORY_CARE_PROVIDER_SITE_OTHER): Payer: PPO | Admitting: Family Medicine

## 2015-10-28 VITALS — BP 138/78 | HR 80 | Temp 98.4°F | Resp 14 | Wt 137.0 lb

## 2015-10-28 DIAGNOSIS — Z23 Encounter for immunization: Secondary | ICD-10-CM | POA: Diagnosis not present

## 2015-10-28 DIAGNOSIS — Z1159 Encounter for screening for other viral diseases: Secondary | ICD-10-CM

## 2015-10-28 DIAGNOSIS — Z13820 Encounter for screening for osteoporosis: Secondary | ICD-10-CM | POA: Diagnosis not present

## 2015-10-28 DIAGNOSIS — Z79890 Hormone replacement therapy (postmenopausal): Secondary | ICD-10-CM | POA: Insufficient documentation

## 2015-10-28 DIAGNOSIS — Z5181 Encounter for therapeutic drug level monitoring: Secondary | ICD-10-CM | POA: Diagnosis not present

## 2015-10-28 DIAGNOSIS — E785 Hyperlipidemia, unspecified: Secondary | ICD-10-CM

## 2015-10-28 DIAGNOSIS — Z0000 Encounter for general adult medical examination without abnormal findings: Secondary | ICD-10-CM | POA: Insufficient documentation

## 2015-10-28 DIAGNOSIS — B0229 Other postherpetic nervous system involvement: Secondary | ICD-10-CM

## 2015-10-28 DIAGNOSIS — E782 Mixed hyperlipidemia: Secondary | ICD-10-CM | POA: Insufficient documentation

## 2015-10-28 DIAGNOSIS — Z1239 Encounter for other screening for malignant neoplasm of breast: Secondary | ICD-10-CM | POA: Diagnosis not present

## 2015-10-28 HISTORY — DX: Hyperlipidemia, unspecified: E78.5

## 2015-10-28 HISTORY — DX: Hormone replacement therapy: Z79.890

## 2015-10-28 HISTORY — DX: Mixed hyperlipidemia: E78.2

## 2015-10-28 MED ORDER — EST ESTROGENS-METHYLTEST 1.25-2.5 MG PO TABS: 1.0000 | ORAL_TABLET | Freq: Every day | ORAL | Status: DC

## 2015-10-28 MED ORDER — CHLOROQUINE PHOSPHATE 250 MG PO TABS: 500.0000 mg | ORAL_TABLET | ORAL | Status: DC

## 2015-10-28 MED ORDER — CIPROFLOXACIN HCL 500 MG PO TABS: 500.0000 mg | ORAL_TABLET | Freq: Two times a day (BID) | ORAL | Status: DC

## 2015-10-28 MED ORDER — GABAPENTIN 300 MG PO CAPS: 300.0000 mg | ORAL_CAPSULE | Freq: Every day | ORAL | Status: DC

## 2015-10-28 NOTE — Assessment & Plan Note (Signed)
USPSTF grade A and B recommendations reviewed with patient; age-appropriate recommendations, preventive care, screening tests, etc discussed and encouraged; healthy living encouraged; see AVS for patient education given to patient  

## 2015-10-28 NOTE — Assessment & Plan Note (Signed)
Last LDL 133, just over target, but with excellent HDL; check fasting lipids

## 2015-10-28 NOTE — Assessment & Plan Note (Signed)
Continue 300 mg neurontin; refills provided

## 2015-10-28 NOTE — Assessment & Plan Note (Signed)
DEXA soon, fall precautions, calcium intake, vit D

## 2015-10-28 NOTE — Assessment & Plan Note (Signed)
Discussed one-time hep C screening recommendation for individuals born between 1945-1965 per USPSTF guidelines; patient agrees with testing; Hep C Ab ordered 

## 2015-10-28 NOTE — Assessment & Plan Note (Signed)
Mammogram order entered

## 2015-10-28 NOTE — Patient Instructions (Addendum)
Please do call to schedule your mammogram; the number to schedule one at either [facility] or [facility] is [phone] Or anywhere you choose to go Next colonoscopy due in 2020 Take vitamin D3 1000 iu daily  Your next colonoscopy will be due in 2020 [name] is checking on PCV-13 now Health Maintenance  Topic Date Due  . PNA vac Low Risk Adult (2 of 2 - PCV13) 11/05/2013  . Hepatitis C Screening  05/14/2016 (Originally 1945/08/30)  . INFLUENZA VACCINE  12/15/2015  . MAMMOGRAM  12/09/2016  . TETANUS/TDAP  03/12/2023  . COLONOSCOPY  11/25/2023  . DEXA SCAN  Addressed  . ZOSTAVAX  Completed

## 2015-10-28 NOTE — Progress Notes (Signed)
Patient: [name], Female    DOB: Sep 15, 1945, 70 y.o.   MRN: [id number]  Visit Date: 10/28/2015  Today's Provider: Enid Derry, MD   Chief Complaint  Patient presents with  . Annual Exam    medicare wellness    Subjective:   [name] is a 70 y.o. female who presents today for her Subsequent Annual Wellness Visit.  Caregiver input:  N/a  USPSTF grade A and B recommendations Alcohol: no Depression: Depression screen [facility] 2/9 10/28/2015 10/27/2014  Decreased Interest 0 0  Down, Depressed, Hopeless 0 0  PHQ - 2 Score 0 0   Hypertension: well-controlled Obesity: no Tobacco use: no  HIV, hep B, hep C: no transfusion, do Hep C STD testing and prevention (chl/gon/syphilis): politely declined Lipids: check today Glucose: 79 last year Colorectal cancer: 2015; nothing found; next due in 5 years Breast cancer: order Intimate partner violence: no Cervical cancer screening: all normal, asymptomatic Lung cancer: n/a Osteoporosis: ordered, coming up Fall prevention/vitamin D: good precautions, taking vit D AAA: n/a Aspirin: taking aspirin Diet: pretty good Exercise: active, does silver sneakers Skin cancer: goes to derm yearly, next week or two  HPI  Review of Systems  Past Medical History  Diagnosis Date  . TMJ (dislocation of temporomandibular joint)   . Osteopenia   . Hypertension   . Cataract 2011    remove from right eye  . Arthritis     in back  . Hyperlipidemia   . Dyslipidemia, goal LDL below 130 10/28/2015  . Hormone replacement therapy (postmenopausal) 10/28/2015   Past Surgical History  Procedure Laterality Date  . Breast enhancement surgery    . Incontinence surgery    . Anterior and posterior repair    . Bladder suspension    . Nose surgery    . Eye surgery  20114    right eye  .  mesh sling x 15 yrs    . Breast surgery  2012    BILATERAL BREAST IMPLANT REMOVAL  . Vaginal hysterectomy      still has ovaries  . Nasal sinus surgery      Family History  Problem Relation Age of Onset  . Cancer Mother     COLON AND UTERINE  . Cancer Father     PROSTATE  . Diabetes Father   . Hypertension Father   . Stroke Father   . Arthritis Sister   . Diabetes Son    Social History   Social History  . Marital Status: Married    Spouse Name: N/A  . Number of Children: N/A  . Years of Education: N/A   Occupational History  . Not on file.   Social History Main Topics  . Smoking status: Never Smoker   . Smokeless tobacco: Never Used  . Alcohol Use: No  . Drug Use: No  . Sexual Activity:    Partners: Male    Birth Control/ Protection: Surgical   Other Topics Concern  . Not on file   Social History Narrative    Outpatient Encounter Prescriptions as of 10/28/2015  Medication Sig Note  . Alfalfa 250 MG TABS Take by mouth.   Marland Kitchen aspirin 81 MG tablet Take 81 mg by mouth daily.   . calcium carbonate (OS-CAL) 600 MG TABS Take 600 mg by mouth as needed.    . celecoxib (CELEBREX) 200 MG capsule Take 200 mg by mouth daily.  09/10/2013: Received from: External Pharmacy  . chloroquine (ARALEN) 250 MG tablet Take 2  tablets (500 mg total) by mouth once a week.   . cholecalciferol (VITAMIN D) 1000 UNITS tablet Take 1,000 Units by mouth daily.   . ciprofloxacin (CIPRO) 500 MG tablet Take 1 tablet (500 mg total) by mouth 2 (two) times daily.   Marland Kitchen estrogen-methylTESTOSTERone (ESTRATEST) 1.25-2.5 MG tablet Take 1 tablet by mouth daily.   . fish oil-omega-3 fatty acids 1000 MG capsule Take 2 g by mouth daily.   Marland Kitchen gabapentin (NEURONTIN) 300 MG capsule Take 1 capsule (300 mg total) by mouth at bedtime.   . Multiple Vitamin (MULTIVITAMIN) capsule Take 1 capsule by mouth daily.   . multivitamin-lutein (OCUVITE-LUTEIN) CAPS Take 1 capsule by mouth daily.   . [DISCONTINUED] chloroquine (ARALEN) 250 MG tablet Take 500 mg by mouth once a week.   . [DISCONTINUED] ciprofloxacin (CIPRO) 500 MG tablet Take 1 tablet (500 mg total) by mouth 2 (two)  times daily.   . [DISCONTINUED] clobetasol cream (TEMOVATE) [id number] % Apply 1 application topically 2 (two) times daily. Too strong for face, groin, axilla, children   . [DISCONTINUED] estrogen-methylTESTOSTERone (ESTRATEST) 1.25-2.5 MG tablet Take 1 tablet by mouth daily.   . [DISCONTINUED] gabapentin (NEURONTIN) 300 MG capsule One by mouth every morning, one every afternoon, and two at bedtime (Patient taking differently: 300 mg daily. One by mouth every morning, one every afternoon, and two at bedtime)    No facility-administered encounter medications on file as of 10/28/2015.   Functional Ability / Safety Screening 1.  Was the timed Get Up and Go test longer than 30 seconds?  no 2.  Does the patient need help with the phone, transportation, shopping,      preparing meals, housework, laundry, medications, or managing money?  no 3.  Does the patient's home have:  loose throw rugs in the hallway?   no      Grab bars in the bathroom? no      Handrails on the stairs?   yes      Poor lighting?   no 4.  Has the patient noticed any hearing difficulties?   no  Fall Risk Assessment See under rooming  Depression Screen See under rooming Depression screen [facility] 2/9 10/28/2015 10/27/2014  Decreased Interest 0 0  Down, Depressed, Hopeless 0 0  PHQ - 2 Score 0 0   Advanced Directives Does patient have a HCPOA?    yes If yes, name and contact information: husband, [phone] Does patient have a living will or MOST form?  yes  Objective:   Vitals: BP 138/78 mmHg  Pulse 80  Temp(Src) 98.4 F (36.9 C) (Oral)  Resp 14  Wt 137 lb (62.143 kg)  SpO2 97%  LMP  (LMP Unknown) Body mass index is 22.8 kg/(m^2). No exam data present  Physical Exam Mood/affect:   Appearance:    Cognitive Testing - 6-CIT  Correct? Score   What year is it? yes 0 Yes = 0    No = 4  What month is it? yes 0 Yes = 0    No = 3  Remember:     Pia Mau, [place], [place]     What time is it? yes 0 Yes = 0    No  = 3  Count backwards from 20 to 1 yes 0 Correct = 0    1 error = 2   More than 1 error = 4  Say the months of the year in reverse. yes 0 Correct = 0    1  error = 2   More than 1 error = 4  What address did I ask you to remember? yes 0 Correct = 0  1 error = 2    2 error = 4    3 error = 6    4 error = 8    All wrong = 10       TOTAL SCORE  0/28   Interpretation:  Normal  Normal (0-7) Abnormal (8-28)    Assessment & Plan:     Annual Wellness Visit  Reviewed patient's Family Medical History Reviewed and updated list of patient's medical providers Assessment of cognitive impairment was done Assessed patient's functional ability Established a written schedule for health screening [place] Completed and Reviewed  Exercise Activities and Dietary recommendations Goals    None    continue doing silver sneakers, stay active, 3 days a week  Immunization History  Administered Date(s) Administered  . Influenza-Unspecified 03/02/2015  . Pneumococcal Conjugate-13 10/28/2015  . Pneumococcal Polysaccharide-23 11/05/2012  . Td 03/11/2013  . Zoster 05/26/2006  check PCV-13  Health Maintenance  Topic Date Due  . Hepatitis C Screening  05/14/2016 (Originally 1946/03/31)  . INFLUENZA VACCINE  12/15/2015  . MAMMOGRAM  12/09/2016  . COLONOSCOPY  11/25/2018  . TETANUS/TDAP  03/12/2023  . DEXA SCAN  Addressed  . ZOSTAVAX  Completed  . PNA vac Low Risk Adult  Completed  PCV-13   Discussed health benefits of physical activity, and encouraged her to engage in regular exercise appropriate for her age and condition.   Meds ordered this encounter  Medications  . estrogen-methylTESTOSTERone (ESTRATEST) 1.25-2.5 MG tablet    Sig: Take 1 tablet by mouth daily.    Dispense:  30 tablet    Refill:  5  . ciprofloxacin (CIPRO) 500 MG tablet    Sig: Take 1 tablet (500 mg total) by mouth 2 (two) times daily.    Dispense:  20 tablet    Refill:  1  . chloroquine (ARALEN) 250  MG tablet    Sig: Take 2 tablets (500 mg total) by mouth once a week.    Dispense:  30 tablet    Refill:  0  . gabapentin (NEURONTIN) 300 MG capsule    Sig: Take 1 capsule (300 mg total) by mouth at bedtime.    Dispense:  30 capsule    Refill:  11    New instructions    Current outpatient prescriptions:  .  Alfalfa 250 MG TABS, Take by mouth., Disp: , Rfl:  .  aspirin 81 MG tablet, Take 81 mg by mouth daily., Disp: , Rfl:  .  calcium carbonate (OS-CAL) 600 MG TABS, Take 600 mg by mouth as needed. , Disp: , Rfl:  .  celecoxib (CELEBREX) 200 MG capsule, Take 200 mg by mouth daily. , Disp: , Rfl:  .  chloroquine (ARALEN) 250 MG tablet, Take 2 tablets (500 mg total) by mouth once a week., Disp: 30 tablet, Rfl: 0 .  cholecalciferol (VITAMIN D) 1000 UNITS tablet, Take 1,000 Units by mouth daily., Disp: , Rfl:  .  ciprofloxacin (CIPRO) 500 MG tablet, Take 1 tablet (500 mg total) by mouth 2 (two) times daily., Disp: 20 tablet, Rfl: 1 .  estrogen-methylTESTOSTERone (ESTRATEST) 1.25-2.5 MG tablet, Take 1 tablet by mouth daily., Disp: 30 tablet, Rfl: 5 .  fish oil-omega-3 fatty acids 1000 MG capsule, Take 2 g by mouth daily., Disp: , Rfl:  .  gabapentin (NEURONTIN) 300 MG capsule, Take 1  capsule (300 mg total) by mouth at bedtime., Disp: 30 capsule, Rfl: 11 .  Multiple Vitamin (MULTIVITAMIN) capsule, Take 1 capsule by mouth daily., Disp: , Rfl:  .  multivitamin-lutein (OCUVITE-LUTEIN) CAPS, Take 1 capsule by mouth daily., Disp: , Rfl:  Medications Discontinued During This Encounter  Medication Reason  . clobetasol cream (TEMOVATE) 0.05 % Error  . ciprofloxacin (CIPRO) 500 MG tablet Reorder  . chloroquine (ARALEN) 250 MG tablet Reorder  . estrogen-methylTESTOSTERone (ESTRATEST) 1.25-2.5 MG tablet Reorder  . ciprofloxacin (CIPRO) 500 MG tablet Reorder  . chloroquine (ARALEN) 250 MG tablet Reorder  . gabapentin (NEURONTIN) 300 MG capsule Reorder    Next Medicare Wellness Visit in 12+  months  Problem List Items Addressed This Visit      Nervous and Auditory   Postzoster neuralgia    Continue 300 mg neurontin; refills provided      Relevant Medications   gabapentin (NEURONTIN) 300 MG capsule     Other   Breast cancer screening    Mammogram order entered      Relevant Orders   MM DIGITAL SCREENING BILATERAL   Dyslipidemia, goal LDL below 130    Last LDL 133, just over target, but with excellent HDL; check fasting lipids      Relevant Orders   Lipid Panel w/o Chol/HDL Ratio   Hormone replacement therapy (postmenopausal)    Discussed with patient, she is aware of risk of breast cancer; she does not wish to taper at this time      Medication monitoring encounter    Check creatinine, CBC today      Relevant Orders   CBC with Differential/Platelet   Basic metabolic panel   Need for hepatitis C screening test    Discussed one-time hep C screening recommendation for individuals born between 1945-1965 per USPSTF guidelines; patient agrees with testing; Hep C Ab ordered      Relevant Orders   Hepatitis C Antibody   Need for pneumococcal vaccination    PPSV-23 UTD; no record of PCV-13 given in our system, and CMA checked with previous office back to 2014 (PCV-13 approved for seniors around August 2014); no record of prior administration, so PCV-13 offered and given today; she will not need another booster of any current pneumonia vaccine for the rest of her life per current ACIP guidelines      Relevant Orders   Pneumococcal conjugate vaccine 13-valent (Completed)   Osteoporosis screening    DEXA soon, fall precautions, calcium intake, vit D      Preventative health care - Primary    USPSTF grade A and B recommendations reviewed with patient; age-appropriate recommendations, preventive care, screening tests, etc discussed and encouraged; healthy living encouraged; see AVS for patient education given to patient

## 2015-10-28 NOTE — Assessment & Plan Note (Signed)
Check creatinine, CBC today

## 2015-10-28 NOTE — Assessment & Plan Note (Signed)
Discussed with patient, she is aware of risk of breast cancer; she does not wish to taper at this time

## 2015-10-28 NOTE — Assessment & Plan Note (Signed)
PPSV-23 UTD; no record of PCV-13 given in our system, and CMA checked with previous office back to 2014 (PCV-13 approved for seniors around August 2014); no record of prior administration, so PCV-13 offered and given today; she will not need another booster of any current pneumonia vaccine for the rest of her life per current ACIP guidelines

## 2015-10-29 DIAGNOSIS — E785 Hyperlipidemia, unspecified: Secondary | ICD-10-CM | POA: Diagnosis not present

## 2015-10-29 DIAGNOSIS — Z1159 Encounter for screening for other viral diseases: Secondary | ICD-10-CM | POA: Diagnosis not present

## 2015-10-29 DIAGNOSIS — Z5181 Encounter for therapeutic drug level monitoring: Secondary | ICD-10-CM | POA: Diagnosis not present

## 2015-10-30 LAB — BASIC METABOLIC PANEL
BUN / CREAT RATIO: 27 (ref 12–28)
BUN: 19 mg/dL (ref 8–27)
CHLORIDE: 101 mmol/L (ref 96–106)
CO2: 25 mmol/L (ref 18–29)
Calcium: 9 mg/dL (ref 8.7–10.3)
Creatinine, Ser: 0.7 mg/dL (ref 0.57–1.00)
GFR calc non Af Amer: 89 mL/min/{1.73_m2} (ref >59)
GFR, EST AFRICAN AMERICAN: 102 mL/min/{1.73_m2} (ref >59)
GLUCOSE: 83 mg/dL (ref 65–99)
POTASSIUM: 4.6 mmol/L (ref 3.5–5.2)
Sodium: 141 mmol/L (ref 134–144)

## 2015-10-30 LAB — CBC WITH DIFFERENTIAL/PLATELET
BASOS: 1 %
Basophils Absolute: 0 10*3/uL (ref 0.0–0.2)
EOS (ABSOLUTE): 0.2 10*3/uL (ref 0.0–0.4)
Eos: 3 %
Hematocrit: 41.6 % (ref 34.0–46.6)
Hemoglobin: 14.2 g/dL (ref 11.1–15.9)
Immature Grans (Abs): 0 10*3/uL (ref 0.0–0.1)
Immature Granulocytes: 0 %
LYMPHS: 20 %
Lymphocytes Absolute: 1.3 10*3/uL (ref 0.7–3.1)
MCH: 31.7 pg (ref 26.6–33.0)
MCHC: 34.1 g/dL (ref 31.5–35.7)
MCV: 93 fL (ref 79–97)
MONOS ABS: 0.6 10*3/uL (ref 0.1–0.9)
Monocytes: 9 %
NEUTROS ABS: 4.3 10*3/uL (ref 1.4–7.0)
Neutrophils: 67 %
PLATELETS: 186 10*3/uL (ref 150–379)
RBC: 4.48 x10E6/uL (ref 3.77–5.28)
RDW: 13 % (ref 12.3–15.4)
WBC: 6.4 10*3/uL (ref 3.4–10.8)

## 2015-10-30 LAB — LIPID PANEL W/O CHOL/HDL RATIO
CHOLESTEROL TOTAL: 219 mg/dL — AB (ref 100–199)
HDL: 71 mg/dL (ref >39)
LDL Calculated: 137 mg/dL — ABNORMAL HIGH (ref 0–99)
Triglycerides: 54 mg/dL (ref 0–149)
VLDL CHOLESTEROL CAL: 11 mg/dL (ref 5–40)

## 2015-10-30 LAB — HEPATITIS C ANTIBODY: Hep C Virus Ab: <0.1 s/co ratio (ref 0.0–0.9)

## 2015-11-04 DIAGNOSIS — Z85828 Personal history of other malignant neoplasm of skin: Secondary | ICD-10-CM | POA: Diagnosis not present

## 2015-11-04 DIAGNOSIS — L821 Other seborrheic keratosis: Secondary | ICD-10-CM | POA: Diagnosis not present

## 2015-11-04 DIAGNOSIS — I788 Other diseases of capillaries: Secondary | ICD-10-CM | POA: Diagnosis not present

## 2015-11-04 DIAGNOSIS — L570 Actinic keratosis: Secondary | ICD-10-CM | POA: Diagnosis not present

## 2015-11-04 DIAGNOSIS — X32XXXA Exposure to sunlight, initial encounter: Secondary | ICD-10-CM | POA: Diagnosis not present

## 2015-11-04 DIAGNOSIS — Z08 Encounter for follow-up examination after completed treatment for malignant neoplasm: Secondary | ICD-10-CM | POA: Diagnosis not present

## 2015-12-03 ENCOUNTER — Telehealth: Payer: Self-pay | Admitting: Family Medicine

## 2015-12-03 NOTE — Telephone Encounter (Signed)
Pt called states the gabapentin 1qd is not working and cannot sleep so went back to 3 a day, can you write new rx?

## 2015-12-03 NOTE — Telephone Encounter (Signed)
Pt would like a call back about her Gabapentin. Please return her call.

## 2015-12-04 MED ORDER — GABAPENTIN 300 MG PO CAPS: 300.0000 mg | ORAL_CAPSULE | Freq: Three times a day (TID) | ORAL | Status: DC

## 2015-12-04 NOTE — Telephone Encounter (Signed)
Sure, have her taper up: One pill twice a day for three days, then one pill three times a day

## 2015-12-14 NOTE — Telephone Encounter (Signed)
Pit notified

## 2015-12-22 DIAGNOSIS — L570 Actinic keratosis: Secondary | ICD-10-CM | POA: Diagnosis not present

## 2015-12-22 DIAGNOSIS — X32XXXA Exposure to sunlight, initial encounter: Secondary | ICD-10-CM | POA: Diagnosis not present

## 2016-01-11 DIAGNOSIS — Z1231 Encounter for screening mammogram for malignant neoplasm of breast: Secondary | ICD-10-CM | POA: Diagnosis not present

## 2016-01-12 DIAGNOSIS — H1045 Other chronic allergic conjunctivitis: Secondary | ICD-10-CM | POA: Diagnosis not present

## 2016-01-12 DIAGNOSIS — H401130 Primary open-angle glaucoma, bilateral, stage unspecified: Secondary | ICD-10-CM | POA: Diagnosis not present

## 2016-01-12 DIAGNOSIS — Z961 Presence of intraocular lens: Secondary | ICD-10-CM | POA: Diagnosis not present

## 2016-02-15 ENCOUNTER — Encounter: Payer: Self-pay | Admitting: Family Medicine

## 2016-02-15 ENCOUNTER — Ambulatory Visit (INDEPENDENT_AMBULATORY_CARE_PROVIDER_SITE_OTHER): Payer: PPO | Admitting: Family Medicine

## 2016-02-15 VITALS — BP 122/60 | HR 90 | Temp 98.0°F | Resp 14 | Wt 142.0 lb

## 2016-02-15 DIAGNOSIS — Z13820 Encounter for screening for osteoporosis: Secondary | ICD-10-CM

## 2016-02-15 DIAGNOSIS — H35039 Hypertensive retinopathy, unspecified eye: Secondary | ICD-10-CM | POA: Insufficient documentation

## 2016-02-15 DIAGNOSIS — Z79890 Hormone replacement therapy (postmenopausal): Secondary | ICD-10-CM | POA: Diagnosis not present

## 2016-02-15 DIAGNOSIS — L309 Dermatitis, unspecified: Secondary | ICD-10-CM | POA: Diagnosis not present

## 2016-02-15 DIAGNOSIS — Z23 Encounter for immunization: Secondary | ICD-10-CM | POA: Diagnosis not present

## 2016-02-15 DIAGNOSIS — R32 Unspecified urinary incontinence: Secondary | ICD-10-CM | POA: Insufficient documentation

## 2016-02-15 DIAGNOSIS — N3941 Urge incontinence: Secondary | ICD-10-CM

## 2016-02-15 MED ORDER — TRIAMCINOLONE ACETONIDE 0.5 % EX CREA: 1.0000 "application " | TOPICAL_CREAM | Freq: Three times a day (TID) | CUTANEOUS | 0 refills | Status: DC

## 2016-02-15 MED ORDER — MIRABEGRON ER 25 MG PO TB24: 25.0000 mg | ORAL_TABLET | Freq: Every day | ORAL | 5 refills | Status: DC

## 2016-02-15 NOTE — Patient Instructions (Addendum)
Try to get 1200 mg of calcium daily, best in food and drink, only supplement if needed Let's check your urine today I'll put in a referral to the ophthalmologist about your hypertensive retinopathy to see if there is another cause for those changes seen by Dr. Ellin Mayhew Your goal blood pressure is less than 150 mmHg on top. Try to follow the DASH guidelines (DASH stands for Dietary Approaches to Stop Hypertension) Try to limit the sodium in your diet.  Ideally, consume less than 1.5 grams (less than 1,500mg ) per day. Do not add salt when cooking or at the table.  Check the sodium amount on labels when shopping, and choose items lower in sodium when given a choice. Avoid or limit foods that already contain a lot of sodium. Eat a diet rich in fruits and vegetables and whole grains. Use the new cream for the rash as needed; it's too strong for the face, under the arms, or in the groin You are welcome to schedule a bone density test at your convenience; ask their staff to contact Roselyn Reef here if there is any issue with the order Start the new Myrbetriq for bladder issues Try to avoid all chocolate, tea, and caffeinated drinks If symptoms not controlled, just let me know and we'll have you see the urologist

## 2016-02-15 NOTE — Assessment & Plan Note (Signed)
Suggestive of contact dermatitis; explained does not appear to be shingles; will use TAC cream, no strong for face, axilla, groin

## 2016-02-15 NOTE — Assessment & Plan Note (Signed)
Check urine and start myrbetriq

## 2016-02-15 NOTE — Assessment & Plan Note (Signed)
On estratest

## 2016-02-15 NOTE — Progress Notes (Signed)
BP 122/60   Pulse 90   Temp 98 F (36.7 C) (Oral)   Resp 14   Wt 142 lb (64.4 kg)   LMP  (LMP Unknown)   SpO2 97%   BMI 23.63 kg/m    Subjective:    Patient ID: [name], female    DOB: 08-Feb-1946, 70 y.o.   MRN: [id number]  HPI: [name] is a 70 y.o. female  Chief Complaint  Patient presents with  . Rash    on left side  . Hypertension    per eye dr., eye dr. stated needs ekg  . Urinary Incontinence   She used to have hypertension; she is a hyperactive kind of person; she saw her eye doctor Dr. Ellin Mayhew, and he saw evidence of hypertensive retinopathy; he said to get EKG and see primary She does not eat much foods, some salty snacks; used to take HTN medicine, but well-controlled; father had HTN; grandfather had stroke No chest pain No vision changes or headaches  She needs an order for a bone density test; she did have osteopenia, ordered entered in May; took Fosamax for longer than she was supposed to, someone said to stop; gets enough calcium, drinks a lot of calcium  flu shot today  Rash on left flank; 2 weeks; was outside, could have scratched; not putting anything on there  She is having a big increase in her incontinence; she has been on estratest for several years, that controlled it for a while; helps with hot flashes; no visible blood in the urine; no odor to urine; not gushing, but it's a lot; she has to hold herself; brought on by coughing laughing or sneezing, but not just then; has had to increase her pad size; has not seen urologist; empties bladder completely; some urgency  Depression screen [facility] 2/9 02/15/2016 10/28/2015 10/27/2014  Decreased Interest 0 0 0  Down, Depressed, Hopeless 0 0 0  PHQ - 2 Score 0 0 0   Relevant past medical, surgical, family and social history reviewed Past Medical History:  Diagnosis Date  . Arthritis    in back  . Cataract 2011   remove from right eye  . Dyslipidemia, goal LDL below 130 10/28/2015  .  Hormone replacement therapy (postmenopausal) 10/28/2015  . Hyperlipidemia   . Hypertension   . Osteopenia   . TMJ (dislocation of temporomandibular joint)    Past Surgical History:  Procedure Laterality Date  .  MESH SLING X 15 YRS    . ANTERIOR AND POSTERIOR REPAIR    . BLADDER SUSPENSION    . BREAST ENHANCEMENT SURGERY    . BREAST SURGERY  2012   BILATERAL BREAST IMPLANT REMOVAL  . EYE SURGERY  20114   right eye  . INCONTINENCE SURGERY    . NASAL SINUS SURGERY    . NOSE SURGERY    . VAGINAL HYSTERECTOMY     still has ovaries   Family History  Problem Relation Age of Onset  . Cancer Mother     COLON AND UTERINE  . Cancer Father     PROSTATE  . Diabetes Father   . Hypertension Father   . Stroke Father   . Arthritis Sister   . Diabetes Son    Social History  Substance Use Topics  . Smoking status: Never Smoker  . Smokeless tobacco: Never Used  . Alcohol use No   Interim medical history since last visit reviewed. Allergies and medications reviewed  Review of Systems  Per HPI unless specifically indicated above     Objective:    BP 122/60   Pulse 90   Temp 98 F (36.7 C) (Oral)   Resp 14   Wt 142 lb (64.4 kg)   LMP  (LMP Unknown)   SpO2 97%   BMI 23.63 kg/m   Wt Readings from Last 3 Encounters:  02/15/16 142 lb (64.4 kg)  10/28/15 137 lb (62.1 kg)  09/08/15 136 lb (61.7 kg)    Physical Exam  Constitutional: She appears well-developed and well-nourished. No distress.  HENT:  Head: Normocephalic and atraumatic.  Eyes: EOM are normal. No scleral icterus.  Neck: No JVD present. Carotid bruit is not present. No thyromegaly present.  Cardiovascular: Normal rate, regular rhythm and normal heart sounds.   No murmur heard. Pulmonary/Chest: Effort normal and breath sounds normal.  Abdominal: Soft. Bowel sounds are normal. She exhibits no distension and no abdominal bruit.  Musculoskeletal: She exhibits no edema.  Neurological: She is alert. She exhibits  normal muscle tone.  Skin: Skin is warm and dry. She is not diaphoretic. No pallor.  Psychiatric: She has a normal mood and affect. Her behavior is normal. Judgment and thought content normal.   Results for orders placed or performed in visit on 10/28/15  CBC with Differential/Platelet  Result Value Ref Range   WBC 6.4 3.4 - 10.8 x10E3/uL   RBC 4.48 3.77 - 5.28 x10E6/uL   Hemoglobin 14.2 11.1 - 15.9 g/dL   Hematocrit 41.6 34.0 - 46.6 %   MCV 93 79 - 97 fL   MCH 31.7 26.6 - 33.0 pg   MCHC 34.1 31.5 - 35.7 g/dL   RDW 13.0 12.3 - 15.4 %   Platelets 186 150 - 379 x10E3/uL   Neutrophils 67 %   Lymphs 20 %   Monocytes 9 %   Eos 3 %   Basos 1 %   Neutrophils Absolute 4.3 1.4 - 7.0 x10E3/uL   Lymphocytes Absolute 1.3 0.7 - 3.1 x10E3/uL   Monocytes Absolute 0.6 0.1 - 0.9 x10E3/uL   EOS (ABSOLUTE) 0.2 0.0 - 0.4 x10E3/uL   Basophils Absolute 0.0 0.0 - 0.2 x10E3/uL   Immature Granulocytes 0 %   Immature Grans (Abs) 0.0 0.0 - 0.1 [id number]  Basic metabolic panel  Result Value Ref Range   Glucose 83 65 - 99 mg/dL   BUN 19 8 - 27 mg/dL   Creatinine, Ser 0.70 0.57 - 1.00 mg/dL   GFR calc non Af Amer 89 >59 mL/min/1.73   GFR calc Af Amer 102 >59 mL/min/1.73   BUN/Creatinine Ratio 27 12 - 28   Sodium 141 134 - 144 mmol/L   Potassium 4.6 3.5 - 5.2 mmol/L   Chloride 101 96 - 106 mmol/L   CO2 25 18 - 29 mmol/L   Calcium 9.0 8.7 - 10.3 mg/dL  Lipid Panel w/o Chol/HDL Ratio  Result Value Ref Range   Cholesterol, Total 219 (H) 100 - 199 mg/dL   Triglycerides 54 0 - 149 mg/dL   HDL 71 >39 mg/dL   VLDL Cholesterol Cal 11 5 - 40 mg/dL   LDL Calculated 137 (H) 0 - 99 mg/dL  Hepatitis C Antibody  Result Value Ref Range   Hep C Virus Ab <0.1 0.0 - 0.9 s/co ratio      Assessment & Plan:   Problem List Items Addressed This Visit      Musculoskeletal and Integument   Dermatitis    Suggestive of contact dermatitis; explained does not  appear to be shingles; will use TAC cream, no strong for  face, axilla, groin        Other   Urine incontinence    Check urine and start myrbetriq      Relevant Medications   mirabegron ER (MYRBETRIQ) 25 MG TB24 tablet   Other Relevant Orders   Urinalysis w microscopic + reflex cultur   Osteoporosis screening    Order for DEXA from May 2017 printed off and given to patient; calcium 1200 mg through food/drink, only supplement if absolutely needed      Hypertensive retinopathy - Primary    Check EKG today; NSR; no evidence of RVH or LVH on EKG; no ST-T wave changes; refer to ophthalmologist to see if another etiology might explain changes on fundoscopic exam seen by optometrist; BP is well-controlled today; try DASH guidelines and monitor BP weekly for the next month      Relevant Orders   EKG 12-Lead   Ambulatory referral to Ophthalmology   Hormone replacement therapy (postmenopausal)    On estratest       Other Visit Diagnoses    Needs flu shot       Relevant Orders   Flu vaccine HIGH DOSE PF (Fluzone High dose) (Completed)      Follow up plan: Return in about 4 weeks (around 03/14/2016) for blood pressure check with CMA.  An after-visit summary was printed and given to the patient at [place].  Please see the patient instructions which may contain other information and recommendations beyond what is mentioned above in the assessment and plan.  Meds ordered this encounter  Medications  . triamcinolone cream (KENALOG) 0.5 %    Sig: Apply 1 application topically 3 (three) times daily. As needed    Dispense:  30 g    Refill:  0  . mirabegron ER (MYRBETRIQ) 25 MG TB24 tablet    Sig: Take 1 tablet (25 mg total) by mouth daily.    Dispense:  30 tablet    Refill:  5    Orders Placed This Encounter  Procedures  . Flu vaccine HIGH DOSE PF (Fluzone High dose)  . Urinalysis w microscopic + reflex cultur  . Ambulatory referral to Ophthalmology  . EKG 12-Lead

## 2016-02-15 NOTE — Assessment & Plan Note (Addendum)
Order for DEXA from May 2017 printed off and given to patient; calcium 1200 mg through food/drink, only supplement if absolutely needed

## 2016-02-15 NOTE — Assessment & Plan Note (Addendum)
Check EKG today; NSR; no evidence of RVH or LVH on EKG; no ST-T wave changes; refer to ophthalmologist to see if another etiology might explain changes on fundoscopic exam seen by optometrist; BP is well-controlled today; try DASH guidelines and monitor BP weekly for the next month

## 2016-02-27 ENCOUNTER — Telehealth: Payer: Self-pay | Admitting: Family Medicine

## 2016-02-27 NOTE — Telephone Encounter (Signed)
Patient was here on October 2nd; I ordered a urine on her and never received the results; please check on those; if no results, please ask her to return for another urine collection; thank you

## 2016-02-29 LAB — URINALYSIS W MICROSCOPIC + REFLEX CULTURE: YEAST: NONE SEEN [HPF]

## 2016-02-29 NOTE — Telephone Encounter (Signed)
Not sure what happened but left detailed voicemail for patient to come in to give urine sample

## 2016-03-03 ENCOUNTER — Telehealth: Payer: Self-pay | Admitting: Family Medicine

## 2016-03-03 MED ORDER — TOLTERODINE TARTRATE ER 2 MG PO CP24: 2.0000 mg | ORAL_CAPSULE | Freq: Every day | ORAL | 11 refills | Status: DC

## 2016-03-03 NOTE — Telephone Encounter (Signed)
Please let pt know that her insurance won't cover Myrbetriq, so we'll have to use something else I'm sending a new Rx to pharmacy; if not helpful, then please let us know Thank you

## 2016-03-04 NOTE — Telephone Encounter (Signed)
Pt came in and we discuss the issue with the medications and insurance. Pt also stated that they were not able to get a urine sample from her. Went ahead and grab a urine sample and will add the results.

## 2016-03-04 NOTE — Telephone Encounter (Signed)
Left Voicemail asking pt to give me a call back.

## 2016-03-30 ENCOUNTER — Other Ambulatory Visit: Payer: Self-pay

## 2016-03-30 MED ORDER — TOLTERODINE TARTRATE ER 2 MG PO CP24: 2.0000 mg | ORAL_CAPSULE | Freq: Every day | ORAL | 3 refills | Status: DC

## 2016-03-30 NOTE — Telephone Encounter (Signed)
Pt ins requires 90 day supply

## 2016-03-30 NOTE — Telephone Encounter (Signed)
rx sent

## 2016-04-01 ENCOUNTER — Encounter: Payer: Self-pay | Admitting: Family Medicine

## 2016-04-01 ENCOUNTER — Ambulatory Visit (INDEPENDENT_AMBULATORY_CARE_PROVIDER_SITE_OTHER): Payer: PPO | Admitting: Family Medicine

## 2016-04-01 VITALS — BP 118/72 | HR 78 | Temp 98.7°F | Resp 14 | Wt 140.4 lb

## 2016-04-01 DIAGNOSIS — B9789 Other viral agents as the cause of diseases classified elsewhere: Secondary | ICD-10-CM

## 2016-04-01 DIAGNOSIS — N3941 Urge incontinence: Secondary | ICD-10-CM | POA: Diagnosis not present

## 2016-04-01 DIAGNOSIS — G4700 Insomnia, unspecified: Secondary | ICD-10-CM

## 2016-04-01 DIAGNOSIS — J069 Acute upper respiratory infection, unspecified: Secondary | ICD-10-CM | POA: Diagnosis not present

## 2016-04-01 MED ORDER — CIPROFLOXACIN HCL 500 MG PO TABS: 500.0000 mg | ORAL_TABLET | Freq: Two times a day (BID) | ORAL | 1 refills | Status: DC

## 2016-04-01 NOTE — Progress Notes (Signed)
BP 118/72 (BP Location: Left Arm, Patient Position: Sitting, Cuff Size: Normal)   Pulse 78   Temp 98.7 F (37.1 C) (Oral)   Resp 14   Wt 140 lb 6 oz (63.7 kg)   LMP  (LMP Unknown)   SpO2 97%   BMI 23.36 kg/m    Subjective:    Patient ID: [name], female    DOB: 1945/06/02, 70 y.o.   MRN: [id number]  HPI: [name] is a 70 y.o. female  Chief Complaint  Patient presents with  . Cough    Pt got sick after coming from [place]  . Sore Throat   She has returned from [place] and is sick She felt kind of feverish and had sore throat; took some cipro and had that for travelers diarrhea; no diarrhea; taking the antibiotic since Moonday; coughing up congestion; ears are okay; pastor in [place] was sick; took OTC cold medicine Feeling better to check it out No rash No zika virus exposure Incontinence is so much better on the new medicine She wanted to ask about castor oil; asked about safe amount Can she get a cipro refill for [place] (next trip) She asked about acute visits Taking gabapentin, works sometimes; wonders if taking at 5 pm and one at bedtime; no chocolate Balance a little off  Depression screen [facility] 2/9 04/01/2016 02/15/2016 10/28/2015 10/27/2014  Decreased Interest 0 0 0 0  Down, Depressed, Hopeless 0 0 0 0  PHQ - 2 Score 0 0 0 0   Relevant past medical, surgical, family and social history reviewed Past Medical History:  Diagnosis Date  . Arthritis    in back  . Cataract 2011   remove from right eye  . Dyslipidemia, goal LDL below 130 10/28/2015  . Hormone replacement therapy (postmenopausal) 10/28/2015  . Hyperlipidemia   . Hypertension   . Osteopenia   . TMJ (dislocation of temporomandibular joint)    Past Surgical History:  Procedure Laterality Date  .  MESH SLING X 15 YRS    . ANTERIOR AND POSTERIOR REPAIR    . BLADDER SUSPENSION    . BREAST ENHANCEMENT SURGERY    . BREAST SURGERY  2012   BILATERAL BREAST IMPLANT REMOVAL  . EYE SURGERY  20114     right eye  . INCONTINENCE SURGERY    . NASAL SINUS SURGERY    . NOSE SURGERY    . VAGINAL HYSTERECTOMY     still has ovaries   Social History  Substance Use Topics  . Smoking status: Never Smoker  . Smokeless tobacco: Never Used  . Alcohol use No   Interim medical history since last visit reviewed. Allergies and medications reviewed  Review of Systems Per HPI unless specifically indicated above     Objective:    BP 118/72 (BP Location: Left Arm, Patient Position: Sitting, Cuff Size: Normal)   Pulse 78   Temp 98.7 F (37.1 C) (Oral)   Resp 14   Wt 140 lb 6 oz (63.7 kg)   LMP  (LMP Unknown)   SpO2 97%   BMI 23.36 kg/m   Wt Readings from Last 3 Encounters:  04/01/16 140 lb 6 oz (63.7 kg)  02/15/16 142 lb (64.4 kg)  10/28/15 137 lb (62.1 kg)    Physical Exam  Constitutional: She appears well-developed and well-nourished.  HENT:  Head: Normocephalic and atraumatic.  Right Ear: Hearing, tympanic membrane, external ear and ear canal normal. Tympanic membrane is not erythematous. No middle ear  effusion.  Left Ear: Hearing, tympanic membrane, external ear and ear canal normal. Tympanic membrane is not erythematous.  No middle ear effusion.  Nose: No rhinorrhea.  Mouth/Throat: Oropharynx is clear and moist and mucous membranes are normal.  Eyes: EOM are normal. No scleral icterus.  Cardiovascular: Normal rate and regular rhythm.   Pulmonary/Chest: Effort normal and breath sounds normal.  Lymphadenopathy:    She has no cervical adenopathy.  Psychiatric: She has a normal mood and affect. Her behavior is normal.      Assessment & Plan:   Problem List Items Addressed This Visit      Other   Urine incontinence    Continue new medicine      Insomnia    Helped by gabapentin (originally started for post-zoster symptoms); she may take two at bedtime if needed       Other Visit Diagnoses    Viral upper respiratory tract infection    -  Primary   don't believe  antibiotic indicted; suggested rest and hydration       Follow up plan: Return if symptoms worsen or fail to improve.  An after-visit summary was printed and given to the patient at [place].  Please see the patient instructions which may contain other information and recommendations beyond what is mentioned above in the assessment and plan.  Meds ordered this encounter  Medications  . ciprofloxacin (CIPRO) 500 MG tablet    Sig: Take 1 tablet (500 mg total) by mouth 2 (two) times daily.    Dispense:  20 tablet    Refill:  1    No orders of the defined types were placed in this encounter.

## 2016-04-01 NOTE — Patient Instructions (Addendum)
The Financial controller (FAO)/World Health Organization (WHO) Expert Committee on [employer] established an acceptable daily castor oil intake (for man) of 0 to 0.7 mg/kg body weight.  For 140 pounds, 44 mg is the absolutely maximum amount of castor oil considered safe; one teaspoon to one tablespoon a day should be fine  Try vitamin C (orange juice if not diabetic or vitamin C tablets) and drink green tea to help your immune system during your illness Get plenty of rest and hydration

## 2016-04-05 ENCOUNTER — Other Ambulatory Visit: Payer: Self-pay

## 2016-04-05 MED ORDER — GABAPENTIN 300 MG PO CAPS: 300.0000 mg | ORAL_CAPSULE | Freq: Three times a day (TID) | ORAL | 1 refills | Status: DC

## 2016-04-05 NOTE — Telephone Encounter (Signed)
rx sent

## 2016-04-05 NOTE — Telephone Encounter (Signed)
Patient needs 90 day supply

## 2016-04-10 DIAGNOSIS — G4700 Insomnia, unspecified: Secondary | ICD-10-CM | POA: Insufficient documentation

## 2016-04-10 NOTE — Assessment & Plan Note (Signed)
Helped by gabapentin (originally started for post-zoster symptoms); she may take two at bedtime if needed

## 2016-04-10 NOTE — Assessment & Plan Note (Signed)
Continue new medicine

## 2016-04-11 DIAGNOSIS — Z87311 Personal history of (healed) other pathological fracture: Secondary | ICD-10-CM | POA: Diagnosis not present

## 2016-04-11 DIAGNOSIS — E2839 Other primary ovarian failure: Secondary | ICD-10-CM | POA: Diagnosis not present

## 2016-04-11 DIAGNOSIS — M810 Age-related osteoporosis without current pathological fracture: Secondary | ICD-10-CM | POA: Diagnosis not present

## 2016-04-22 ENCOUNTER — Other Ambulatory Visit: Payer: Self-pay | Admitting: Family Medicine

## 2016-04-24 NOTE — Telephone Encounter (Signed)
Please phone this Rx in; it is controlled and cannot be sent electronically, but it can be called in without a hard signature; thank you

## 2016-04-25 NOTE — Telephone Encounter (Signed)
Called in.

## 2016-05-23 ENCOUNTER — Telehealth: Payer: Self-pay | Admitting: Family Medicine

## 2016-05-23 MED ORDER — TOLTERODINE TARTRATE ER 4 MG PO CP24: 4.0000 mg | ORAL_CAPSULE | Freq: Every day | ORAL | 11 refills | Status: DC

## 2016-05-23 NOTE — Telephone Encounter (Signed)
Pt states she is currently taking Tolterodine and pt states this is not working for her and is requesting something else be called in. [employer]. Pt is going to [place] next week. Please advise.

## 2016-05-23 NOTE — Telephone Encounter (Signed)
Patient notified

## 2016-05-23 NOTE — Telephone Encounter (Signed)
I'll double the dose from 2 mg to 4 mg a day; new Rx sent to [place] If any symptoms of bladder infection, she'll want to get that checked out before her trip (burning, urgency, frequency, odor, etc.)

## 2016-06-20 ENCOUNTER — Telehealth: Payer: Self-pay

## 2016-06-20 MED ORDER — MIRABEGRON ER 25 MG PO TB24: 25.0000 mg | ORAL_TABLET | Freq: Every day | ORAL | 11 refills | Status: DC

## 2016-06-20 NOTE — Telephone Encounter (Signed)
Patient notified

## 2016-06-20 NOTE — Telephone Encounter (Signed)
Patient states since you have upped dosage of detrol it is still not working for her.  Can you change to something else?

## 2016-06-20 NOTE — Telephone Encounter (Signed)
New rx sent

## 2016-06-27 ENCOUNTER — Other Ambulatory Visit: Payer: Self-pay

## 2016-06-29 ENCOUNTER — Telehealth: Payer: Self-pay

## 2016-06-29 DIAGNOSIS — Z23 Encounter for immunization: Secondary | ICD-10-CM

## 2016-06-29 MED ORDER — EST ESTROGENS-METHYLTEST 1.25-2.5 MG PO TABS: 1.0000 | ORAL_TABLET | Freq: Every day | ORAL | 3 refills | Status: DC

## 2016-06-29 NOTE — Telephone Encounter (Signed)
Patient stated she had her mammogram last year. I asked her where she went. I looked up care everything and I saw she had it on 01/11/2016, [facility] by [facility]. The imaging results are there. Also, Pt would like to know even if she had the shingles vaccine in 2008 and had the shingles in November 2016; She is wondering can she have the new and updated Shingles vaccination. She mention to me she read in a magazine that its a new and improve vaccine for shingles and she would like to have it.

## 2016-06-29 NOTE — Assessment & Plan Note (Signed)
Shingrix ordered.

## 2016-06-29 NOTE — Telephone Encounter (Signed)
Please remind patient that mammogram is due; thank you; re-enter order if expired

## 2016-06-29 NOTE — Telephone Encounter (Signed)
Please update the health maintenance list, then, as it still documents that her last mammogram was on 12/10/2014 -------------------------- She is welcome to get Shingrix She can have this even if she had the Zostavax and if she had shingles She will get two doses, administered 2-6 months apart I'll welcome her to check out the CDC.gov website  [employer]com.br  Who Should Get Shingrix? Healthy adults 50 years and older should get two doses of Shingrix, separated by 2 to 6 months. You should get Shingrix even if in the past you had shingles  received Zostavax  are not sure if you had chickenpox

## 2016-06-30 NOTE — Telephone Encounter (Signed)
Left patient detail voicemail about getting the Shingrix. I  mention she can come in our office anytime Monday-Friday form 8am-12pm and 2pm-4:30pm for the vaccine to be administered.

## 2016-07-04 DIAGNOSIS — H35039 Hypertensive retinopathy, unspecified eye: Secondary | ICD-10-CM | POA: Diagnosis not present

## 2016-07-04 DIAGNOSIS — Z961 Presence of intraocular lens: Secondary | ICD-10-CM | POA: Diagnosis not present

## 2016-07-04 DIAGNOSIS — H40019 Open angle with borderline findings, low risk, unspecified eye: Secondary | ICD-10-CM | POA: Diagnosis not present

## 2016-07-04 DIAGNOSIS — H1045 Other chronic allergic conjunctivitis: Secondary | ICD-10-CM | POA: Diagnosis not present

## 2016-07-20 DIAGNOSIS — Z08 Encounter for follow-up examination after completed treatment for malignant neoplasm: Secondary | ICD-10-CM | POA: Diagnosis not present

## 2016-07-20 DIAGNOSIS — L570 Actinic keratosis: Secondary | ICD-10-CM | POA: Diagnosis not present

## 2016-07-20 DIAGNOSIS — X32XXXA Exposure to sunlight, initial encounter: Secondary | ICD-10-CM | POA: Diagnosis not present

## 2016-07-20 DIAGNOSIS — L814 Other melanin hyperpigmentation: Secondary | ICD-10-CM | POA: Diagnosis not present

## 2016-07-20 DIAGNOSIS — Z85828 Personal history of other malignant neoplasm of skin: Secondary | ICD-10-CM | POA: Diagnosis not present

## 2016-10-31 ENCOUNTER — Encounter: Payer: PPO | Admitting: Family Medicine

## 2016-11-01 ENCOUNTER — Encounter: Payer: PPO | Admitting: Family Medicine

## 2016-12-05 ENCOUNTER — Ambulatory Visit (INDEPENDENT_AMBULATORY_CARE_PROVIDER_SITE_OTHER): Payer: PPO | Admitting: Family Medicine

## 2016-12-05 ENCOUNTER — Encounter: Payer: Self-pay | Admitting: Family Medicine

## 2016-12-05 VITALS — BP 116/78 | HR 85 | Temp 97.9°F | Resp 14 | Ht 64.0 in | Wt 137.2 lb

## 2016-12-05 DIAGNOSIS — Z1231 Encounter for screening mammogram for malignant neoplasm of breast: Secondary | ICD-10-CM | POA: Diagnosis not present

## 2016-12-05 DIAGNOSIS — Z1239 Encounter for other screening for malignant neoplasm of breast: Secondary | ICD-10-CM

## 2016-12-05 DIAGNOSIS — Z789 Other specified health status: Secondary | ICD-10-CM | POA: Insufficient documentation

## 2016-12-05 DIAGNOSIS — Z0000 Encounter for general adult medical examination without abnormal findings: Secondary | ICD-10-CM | POA: Diagnosis not present

## 2016-12-05 DIAGNOSIS — Z23 Encounter for immunization: Secondary | ICD-10-CM

## 2016-12-05 HISTORY — DX: Other specified health status: Z78.9

## 2016-12-05 MED ORDER — TOLTERODINE TARTRATE ER 4 MG PO CP24: 4.0000 mg | ORAL_CAPSULE | Freq: Every day | ORAL | 3 refills | Status: DC

## 2016-12-05 MED ORDER — GABAPENTIN 300 MG PO CAPS: 300.0000 mg | ORAL_CAPSULE | Freq: Three times a day (TID) | ORAL | 1 refills | Status: DC

## 2016-12-05 MED ORDER — EST ESTROGENS-METHYLTEST 1.25-2.5 MG PO TABS: 1.0000 | ORAL_TABLET | Freq: Every day | ORAL | 1 refills | Status: DC

## 2016-12-05 NOTE — Patient Instructions (Addendum)
We recommend 1,000 iu vitamin D3 daily You can get the Shingrix at [facility]  Topic Date Due  . INFLUENZA VACCINE  12/14/2016  . MAMMOGRAM  01/09/2017  . DEXA SCAN  04/11/2018  . COLONOSCOPY  11/25/2018  . TETANUS/TDAP  03/12/2023  . Hepatitis C Screening  Completed  . PNA vac Low Risk Adult  Completed   Return for fasting labs in the next few weeks  Health Maintenance, Female Adopting a healthy lifestyle and getting preventive care can go a long way to promote health and wellness. Talk with your health care provider about what schedule of regular examinations is right for you. This is a good chance for you to check in with your provider about disease prevention and staying healthy. In between checkups, there are plenty of things you can do on your own. Experts have done a lot of research about which lifestyle changes and preventive measures are most likely to keep you healthy. Ask your health care provider for more information. Weight and diet Eat a healthy diet  Be sure to include plenty of vegetables, fruits, low-fat dairy products, and lean protein.  Do not eat a lot of foods high in solid fats, added sugars, or salt.  Get regular exercise. This is one of the most important things you can do for your health. ? Most adults should exercise for at least 150 minutes each week. The exercise should increase your heart rate and make you sweat (moderate-intensity exercise). ? Most adults should also do strengthening exercises at least twice a week. This is in addition to the moderate-intensity exercise.  Maintain a healthy weight  Body mass index (BMI) is a measurement that can be used to identify possible weight problems. It estimates body fat based on height and weight. Your health care provider can help determine your BMI and help you achieve or maintain a healthy weight.  For females 22 years of age and older: ? A BMI below 18.5 is considered underweight. ? A  BMI of 18.5 to 24.9 is normal. ? A BMI of 25 to 29.9 is considered overweight. ? A BMI of 30 and above is considered obese.  Watch levels of cholesterol and blood lipids  You should start having your blood tested for lipids and cholesterol at 71 years of age, then have this test every 5 years.  You may need to have your cholesterol levels checked more often if: ? Your lipid or cholesterol levels are high. ? You are older than 71 years of age. ? You are at high risk for heart disease.  Cancer screening Lung Cancer  Lung cancer screening is recommended for adults 60-90 years old who are at high risk for lung cancer because of a history of smoking.  A yearly low-dose CT scan of the lungs is recommended for people who: ? Currently smoke. ? Have quit within the past 15 years. ? Have at least a 30-pack-year history of smoking. A pack year is smoking an average of one pack of cigarettes a day for 1 year.  Yearly screening should continue until it has been 15 years since you quit.  Yearly screening should stop if you develop a health problem that would prevent you from having lung cancer treatment.  Breast Cancer  Practice breast self-awareness. This means understanding how your breasts normally appear and feel.  It also means doing regular breast self-exams. Let your health care provider know about any changes, no matter how small.  If  you are in your 20s or 30s, you should have a clinical breast exam (CBE) by a health care provider every 1-3 years as part of a regular health exam.  If you are 2 or older, have a CBE every year. Also consider having a breast X-ray (mammogram) every year.  If you have a family history of breast cancer, talk to your health care provider about genetic screening.  If you are at high risk for breast cancer, talk to your health care provider about having an MRI and a mammogram every year.  Breast cancer gene (BRCA) assessment is recommended for women who  have family members with BRCA-related cancers. BRCA-related cancers include: ? Breast. ? Ovarian. ? Tubal. ? Peritoneal cancers.  Results of the assessment will determine the need for genetic counseling and BRCA1 and BRCA2 testing.  Cervical Cancer Your health care provider may recommend that you be screened regularly for cancer of the pelvic organs (ovaries, uterus, and vagina). This screening involves a pelvic examination, including checking for microscopic changes to the surface of your cervix (Pap test). You may be encouraged to have this screening done every 3 years, beginning at age 15.  For women ages 26-65, health care providers may recommend pelvic exams and Pap testing every 3 years, or they may recommend the Pap and pelvic exam, combined with testing for human papilloma virus (HPV), every 5 years. Some types of HPV increase your risk of cervical cancer. Testing for HPV may also be done on women of any age with unclear Pap test results.  Other health care providers may not recommend any screening for nonpregnant women who are considered low risk for pelvic cancer and who do not have symptoms. Ask your health care provider if a screening pelvic exam is right for you.  If you have had past treatment for cervical cancer or a condition that could lead to cancer, you need Pap tests and screening for cancer for at least 20 years after your treatment. If Pap tests have been discontinued, your risk factors (such as having a new sexual partner) need to be reassessed to determine if screening should resume. Some women have medical problems that increase the chance of getting cervical cancer. In these cases, your health care provider may recommend more frequent screening and Pap tests.  Colorectal Cancer  This type of cancer can be detected and often prevented.  Routine colorectal cancer screening usually begins at 71 years of age and continues through 72 years of age.  Your health care  provider may recommend screening at an earlier age if you have risk factors for colon cancer.  Your health care provider may also recommend using home test kits to check for hidden blood in the stool.  A small camera at the end of a tube can be used to examine your colon directly (sigmoidoscopy or colonoscopy). This is done to check for the earliest forms of colorectal cancer.  Routine screening usually begins at age 22.  Direct examination of the colon should be repeated every 5-10 years through 71 years of age. However, you may need to be screened more often if early forms of precancerous polyps or small growths are found.  Skin Cancer  Check your skin from head to toe regularly.  Tell your health care provider about any new moles or changes in moles, especially if there is a change in a mole's shape or color.  Also tell your health care provider if you have a mole that is larger  than the size of a pencil eraser.  Always use sunscreen. Apply sunscreen liberally and repeatedly throughout the day.  Protect yourself by wearing long sleeves, pants, a wide-brimmed hat, and sunglasses whenever you are outside.  Heart disease, diabetes, and high blood pressure  High blood pressure causes heart disease and increases the risk of stroke. High blood pressure is more likely to develop in: ? People who have blood pressure in the high end of the normal range (130-139/85-89 mm Hg). ? People who are overweight or obese. ? People who are African American.  If you are 26-67 years of age, have your blood pressure checked every 3-5 years. If you are 27 years of age or older, have your blood pressure checked every year. You should have your blood pressure measured twice-once when you are at a hospital or clinic, and once when you are not at a hospital or clinic. Record the average of the two measurements. To check your blood pressure when you are not at a hospital or clinic, you can use: ? An automated  blood pressure machine at a pharmacy. ? A home blood pressure monitor.  If you are between 6 years and 81 years old, ask your health care provider if you should take aspirin to prevent strokes.  Have regular diabetes screenings. This involves taking a blood sample to check your fasting blood sugar level. ? If you are at a normal weight and have a low risk for diabetes, have this test once every three years after 71 years of age. ? If you are overweight and have a high risk for diabetes, consider being tested at a younger age or more often. Preventing infection Hepatitis B  If you have a higher risk for hepatitis B, you should be screened for this virus. You are considered at high risk for hepatitis B if: ? You were born in a country where hepatitis B is common. Ask your health care provider which countries are considered high risk. ? Your parents were born in a high-risk country, and you have not been immunized against hepatitis B (hepatitis B vaccine). ? You have HIV or AIDS. ? You use needles to inject street drugs. ? You live with someone who has hepatitis B. ? You have had sex with someone who has hepatitis B. ? You get hemodialysis treatment. ? You take certain medicines for conditions, including cancer, organ transplantation, and autoimmune conditions.  Hepatitis C  Blood testing is recommended for: ? Everyone born from [birth year] through 1965. ? Anyone with known risk factors for hepatitis C.  Sexually transmitted infections (STIs)  You should be screened for sexually transmitted infections (STIs) including gonorrhea and chlamydia if: ? You are sexually active and are younger than 71 years of age. ? You are older than 71 years of age and your health care provider tells you that you are at risk for this type of infection. ? Your sexual activity has changed since you were last screened and you are at an increased risk for chlamydia or gonorrhea. Ask your health care provider if you  are at risk.  If you do not have HIV, but are at risk, it may be recommended that you take a prescription medicine daily to prevent HIV infection. This is called pre-exposure prophylaxis (PrEP). You are considered at risk if: ? You are sexually active and do not regularly use condoms or know the HIV status of your partner(s). ? You take drugs by injection. ? You are sexually active with  a partner who has HIV.  Talk with your health care provider about whether you are at high risk of being infected with HIV. If you choose to begin PrEP, you should first be tested for HIV. You should then be tested every 3 months for as long as you are taking PrEP. Pregnancy  If you are premenopausal and you may become pregnant, ask your health care provider about preconception counseling.  If you may become pregnant, take 400 to 800 micrograms (mcg) of folic acid every day.  If you want to prevent pregnancy, talk to your health care provider about birth control (contraception). Osteoporosis and menopause  Osteoporosis is a disease in which the bones lose minerals and strength with aging. This can result in serious bone fractures. Your risk for osteoporosis can be identified using a bone density scan.  If you are 23 years of age or older, or if you are at risk for osteoporosis and fractures, ask your health care provider if you should be screened.  Ask your health care provider whether you should take a calcium or vitamin D supplement to lower your risk for osteoporosis.  Menopause may have certain physical symptoms and risks.  Hormone replacement therapy may reduce some of these symptoms and risks. Talk to your health care provider about whether hormone replacement therapy is right for you. Follow these instructions at home:  Schedule regular health, dental, and eye exams.  Stay current with your immunizations.  Do not use any tobacco products including cigarettes, chewing tobacco, or electronic  cigarettes.  If you are pregnant, do not drink alcohol.  If you are breastfeeding, limit how much and how often you drink alcohol.  Limit alcohol intake to no more than 1 drink per day for nonpregnant women. One drink equals 12 ounces of beer, 5 ounces of wine, or 1 ounces of hard liquor.  Do not use street drugs.  Do not share needles.  Ask your health care provider for help if you need support or information about quitting drugs.  Tell your health care provider if you often feel depressed.  Tell your health care provider if you have ever been abused or do not feel safe at home. This information is not intended to replace advice given to you by your health care provider. Make sure you discuss any questions you have with your health care provider. Document Released: 11/15/2010 Document Revised: 10/08/2015 Document Reviewed: 02/03/2015 [employer]vier Interactive Patient Education  [employer].

## 2016-12-05 NOTE — Progress Notes (Signed)
Patient: [name], Female    DOB: 10/12/1945, 71 y.o.   MRN: [id number]  Visit Date: 12/05/2016  Today's Provider: Enid Derry, MD   Chief Complaint  Patient presents with  . Medicare Wellness  . Medication Refill    Subjective:   [name] is a 71 y.o. female who presents today for her Subsequent Annual Wellness Visit.  Caregiver input:  n/a  USPSTF grade A and B recommendations Depression:  Depression screen [facility] 2/9 12/05/2016 04/01/2016 02/15/2016 10/28/2015 10/27/2014  Decreased Interest 0 0 0 0 0  Down, Depressed, Hopeless 0 0 0 0 0  PHQ - 2 Score 0 0 0 0 0   Hypertension: BP Readings from Last 3 Encounters:  12/05/16 116/78  04/01/16 118/72  02/15/16 122/60   Obesity: Wt Readings from Last 3 Encounters:  12/05/16 137 lb 3.2 oz (62.2 kg)  04/01/16 140 lb 6 oz (63.7 kg)  02/15/16 142 lb (64.4 kg)   BMI Readings from Last 3 Encounters:  12/05/16 23.55 kg/m  04/01/16 23.36 kg/m  02/15/16 23.63 kg/m    Alcohol: no Tobacco use: never HIV, hep B, hep C: already tested Immunization: would like shingles vaccine STD testing and prevention (chl/gon/syphilis): declined Intimate partner violence: no abuse Breast cancer: due Aug 2018; no breast exams Cervical cancer screening: graduated Osteoporosis: no, DEXA scan due in 2019 Fall prevention/vitamin D: discussed, taking one capsule vit D Lipids:  Lab Results  Component Value Date   CHOL 219 (H) 10/29/2015   CHOL 221 (H) 10/27/2014   Lab Results  Component Value Date   HDL 71 10/29/2015   HDL 79 10/27/2014   Lab Results  Component Value Date   LDLCALC 137 (H) 10/29/2015   LDLCALC 133 (H) 10/27/2014   Lab Results  Component Value Date   TRIG 54 10/29/2015   TRIG 43 10/27/2014   No results found for: CHOLHDL No results found for: LDLDIRECT Glucose:  Glucose  Date Value Ref Range Status  10/29/2015 83 65 - 99 mg/dL Final  10/27/2014 79 65 - 99 mg/dL Final   Colorectal cancer: 2015;  next due 2020 Lung cancer:  never AAA: n/a Aspirin: taking 81 mg daily Diet: discussed Exercise: pretty active Skin cancer: sees derm regularly, 2x a year, Dr. Evorn Gong  Needs estratest; not interested in decreasing dose at this time; agrees to get regularly mammogram Needs gabapentin for post-zoster neuralgia HPI  Review of Systems  Past Medical History:  Diagnosis Date  . Arthritis    in back  . Cataract 2011   remove from right eye  . Dyslipidemia, goal LDL below 130 10/28/2015  . Hormone replacement therapy (postmenopausal) 10/28/2015  . Hyperlipidemia   . Hypertension   . Osteopenia   . Patient is full code 12/05/2016  . TMJ (dislocation of temporomandibular joint)     Past Surgical History:  Procedure Laterality Date  .  MESH SLING X 15 YRS    . ANTERIOR AND POSTERIOR REPAIR    . BLADDER SUSPENSION    . BREAST ENHANCEMENT SURGERY    . BREAST SURGERY  2012   BILATERAL BREAST IMPLANT REMOVAL  . EYE SURGERY  20114   right eye  . INCONTINENCE SURGERY    . NASAL SINUS SURGERY    . NOSE SURGERY    . VAGINAL HYSTERECTOMY     still has ovaries    Family History  Problem Relation Age of Onset  . Cancer Mother        COLON  AND UTERINE  . Cancer Father        PROSTATE  . Diabetes Father   . Hypertension Father   . Stroke Father   . Arthritis Sister   . Diabetes Son     Social History   Social History  . Marital status: Married    Spouse name: N/A  . Number of children: N/A  . Years of education: N/A   Occupational History  . Not on file.   Social History Main Topics  . Smoking status: Never Smoker  . Smokeless tobacco: Never Used  . Alcohol use No  . Drug use: No  . Sexual activity: Yes    Partners: Male    Birth control/ protection: Surgical   Other Topics Concern  . Not on file   Social History Narrative  . No narrative on file    Outpatient Encounter Prescriptions as of 12/05/2016  Medication Sig Note  . Alfalfa 250 MG TABS Take by  mouth.   Marland Kitchen aspirin 81 MG tablet Take 81 mg by mouth daily.   . calcium carbonate (OS-CAL) 600 MG TABS Take 600 mg by mouth as needed.    . celecoxib (CELEBREX) 200 MG capsule Take 200 mg by mouth daily.  09/10/2013: Received from: External Pharmacy  . chloroquine (ARALEN) 250 MG tablet Take 2 tablets (500 mg total) by mouth once a week.   . cholecalciferol (VITAMIN D) 1000 UNITS tablet Take 1,000 Units by mouth daily.   Marland Kitchen estrogens-methylTEST (ESTRATEST) 1.25-2.5 MG tablet Take 1 tablet by mouth daily.   . fish oil-omega-3 fatty acids 1000 MG capsule Take 2 g by mouth daily.   Marland Kitchen gabapentin (NEURONTIN) 300 MG capsule Take 1 capsule (300 mg total) by mouth 3 (three) times daily.   . Multiple Vitamin (MULTIVITAMIN) capsule Take 1 capsule by mouth daily.   . multivitamin-lutein (OCUVITE-LUTEIN) CAPS Take 1 capsule by mouth daily.   Marland Kitchen tolterodine (DETROL LA) 4 MG 24 hr capsule Take 1 capsule (4 mg total) by mouth daily.   Marland Kitchen triamcinolone cream (KENALOG) 0.5 % Apply 1 application topically 3 (three) times daily. As needed   . [DISCONTINUED] ciprofloxacin (CIPRO) 500 MG tablet Take 1 tablet (500 mg total) by mouth 2 (two) times daily.   . [DISCONTINUED] estrogens-methylTEST (ESTRATEST) 1.25-2.5 MG tablet Take 1 tablet by mouth daily.   . [DISCONTINUED] gabapentin (NEURONTIN) 300 MG capsule Take 1 capsule (300 mg total) by mouth 3 (three) times daily.   . [DISCONTINUED] mirabegron ER (MYRBETRIQ) 25 MG TB24 tablet Take 1 tablet (25 mg total) by mouth daily.   . [DISCONTINUED] tolterodine (DETROL LA) 4 MG 24 hr capsule Take 4 mg by mouth daily.   . [DISCONTINUED] tolterodine (DETROL LA) 4 MG 24 hr capsule Take 1 capsule (4 mg total) by mouth daily.    No facility-administered encounter medications on file as of 12/05/2016.    Seeing Dr. Ellin Mayhew for eyes  Functional Ability / Safety Screening 1.  Was the timed Get Up and Go test longer than 30 seconds?  no 2.  Does the patient need help with the phone,  transportation, shopping,      preparing meals, housework, laundry, medications, or managing money?  no 3.  Does the patient's home have:  loose throw rugs in the hallway?   no      Grab bars in the bathroom? no      Handrails on the stairs?   yes      Poor lighting?  no 4.  Has the patient noticed any hearing difficulties?   no  Fall Risk Assessment See under rooming  Depression Screen See under rooming Depression screen [facility] 2/9 12/05/2016 04/01/2016 02/15/2016 10/28/2015 10/27/2014  Decreased Interest 0 0 0 0 0  Down, Depressed, Hopeless 0 0 0 0 0  PHQ - 2 Score 0 0 0 0 0    Advanced Directives Does patient have a HCPOA?    yes If yes, name and contact information: Liala Codispoti, [phone] Does patient have a living will or MOST form?  yes  Full code Doesn't want to live on a machine; consult living will for information; okay for feeding tube, etc but not for too long if brain dead [employer] has paperwork  Objective:   Vitals: BP 116/78   Pulse 85   Temp 97.9 F (36.6 C) (Oral)   Resp 14   Ht 5\' 4"  (1.626 m)   Wt 137 lb 3.2 oz (62.2 kg)   LMP  (LMP Unknown)   SpO2 98%   BMI 23.55 kg/m  Body mass index is 23.55 kg/m. No exam data present  Physical Exam  Constitutional: She appears well-developed and well-nourished.  Cardiovascular: Normal rate.   Pulmonary/Chest: Effort normal.  Psychiatric: She has a normal mood and affect.   Mood/affect:  Euthymic, pleasant Appearance:  Neatly dressed, bright, appears younger than stated age  6CIT Screen 12/05/2016  What Year? 0 points  What month? 0 points  What time? 0 points  Count back from 20 0 points  Months in reverse 0 points  Repeat phrase 0 points  Total Score 0    Assessment & Plan:     Annual Wellness Visit  Reviewed patient's Family Medical History Reviewed and updated list of patient's medical providers Assessment of cognitive impairment was done Assessed patient's functional  ability Established a written schedule for health screening [place] Completed and Reviewed  Exercise Activities and Dietary recommendations Goals    None    nothing in particular  Immunization History  Administered Date(s) Administered  . Influenza, High Dose Seasonal PF 02/15/2016  . Influenza-Unspecified 03/02/2015  . Pneumococcal Conjugate-13 10/28/2015  . Pneumococcal Polysaccharide-23 11/05/2012  . Td 03/11/2013  . Zoster 05/26/2006   Health Maintenance  Topic Date Due  . INFLUENZA VACCINE  12/14/2016  . MAMMOGRAM  01/09/2017  . DEXA SCAN  04/11/2018  . COLONOSCOPY  11/25/2018  . TETANUS/TDAP  03/12/2023  . Hepatitis C Screening  Completed  . PNA vac Low Risk Adult  Completed    Discussed health benefits of physical activity, and encouraged her to engage in regular exercise appropriate for her age and condition.   Meds ordered this encounter  Medications  . DISCONTD: tolterodine (DETROL LA) 4 MG 24 hr capsule    Sig: Take 4 mg by mouth daily.  Marland Kitchen DISCONTD: tolterodine (DETROL LA) 4 MG 24 hr capsule    Sig: Take 1 capsule (4 mg total) by mouth daily.    Dispense:  90 capsule    Refill:  3  . estrogens-methylTEST (ESTRATEST) 1.25-2.5 MG tablet    Sig: Take 1 tablet by mouth daily.    Dispense:  90 tablet    Refill:  1    [facility]  . gabapentin (NEURONTIN) 300 MG capsule    Sig: Take 1 capsule (300 mg total) by mouth 3 (three) times daily.    Dispense:  270 capsule    Refill:  1  . tolterodine (DETROL LA)  4 MG 24 hr capsule    Sig: Take 1 capsule (4 mg total) by mouth daily.    Dispense:  90 capsule    Refill:  3    Current Outpatient Prescriptions:  .  Alfalfa 250 MG TABS, Take by mouth., Disp: , Rfl:  .  aspirin 81 MG tablet, Take 81 mg by mouth daily., Disp: , Rfl:  .  calcium carbonate (OS-CAL) 600 MG TABS, Take 600 mg by mouth as needed. , Disp: , Rfl:  .  celecoxib (CELEBREX) 200 MG capsule, Take 200 mg by mouth daily. , Disp:  , Rfl:  .  chloroquine (ARALEN) 250 MG tablet, Take 2 tablets (500 mg total) by mouth once a week., Disp: 30 tablet, Rfl: 0 .  cholecalciferol (VITAMIN D) 1000 UNITS tablet, Take 1,000 Units by mouth daily., Disp: , Rfl:  .  estrogens-methylTEST (ESTRATEST) 1.25-2.5 MG tablet, Take 1 tablet by mouth daily., Disp: 90 tablet, Rfl: 1 .  fish oil-omega-3 fatty acids 1000 MG capsule, Take 2 g by mouth daily., Disp: , Rfl:  .  gabapentin (NEURONTIN) 300 MG capsule, Take 1 capsule (300 mg total) by mouth 3 (three) times daily., Disp: 270 capsule, Rfl: 1 .  Multiple Vitamin (MULTIVITAMIN) capsule, Take 1 capsule by mouth daily., Disp: , Rfl:  .  multivitamin-lutein (OCUVITE-LUTEIN) CAPS, Take 1 capsule by mouth daily., Disp: , Rfl:  .  tolterodine (DETROL LA) 4 MG 24 hr capsule, Take 1 capsule (4 mg total) by mouth daily., Disp: 90 capsule, Rfl: 3 .  triamcinolone cream (KENALOG) 0.5 %, Apply 1 application topically 3 (three) times daily. As needed, Disp: 30 g, Rfl: 0 Medications Discontinued During This Encounter  Medication Reason  . ciprofloxacin (CIPRO) 500 MG tablet   . mirabegron ER (MYRBETRIQ) 25 MG TB24 tablet   . tolterodine (DETROL LA) 4 MG 24 hr capsule Reorder  . estrogens-methylTEST (ESTRATEST) 1.25-2.5 MG tablet Reorder  . gabapentin (NEURONTIN) 300 MG capsule Reorder  . tolterodine (DETROL LA) 4 MG 24 hr capsule Reorder    Next Medicare Wellness Visit in 12+ months  Problem List Items Addressed This Visit      Other   Preventative health care - Primary    USPSTF grade A and B recommendations reviewed with patient; age-appropriate recommendations, preventive care, screening tests, etc discussed and encouraged; healthy living encouraged; see AVS for patient education given to patient       Patient is full code    Discussed, [employer], see paperwork       Other Visit Diagnoses    Screening for breast cancer       Relevant Orders   MM Digital Screening   Need for  zoster vaccine       Relevant Orders   Varicella-zoster vaccine IM (Shingrix)

## 2016-12-05 NOTE — Progress Notes (Signed)
BP 116/78   Pulse 85   Temp 97.9 F (36.6 C) (Oral)   Resp 14   Ht 5\' 4"  (1.626 m)   Wt 137 lb 3.2 oz (62.2 kg)   LMP  (LMP Unknown)   SpO2 98%   BMI 23.55 kg/m    Subjective:    Patient ID: [name], female    DOB: 12-16-1945, 71 y.o.   MRN: [id number]  HPI: [name] is a 71 y.o. female  Chief Complaint  Patient presents with  . Medicare Wellness  . Medication Refill    HPI   Depression screen [facility] 2/9 12/05/2016 04/01/2016 02/15/2016 10/28/2015 10/27/2014  Decreased Interest 0 0 0 0 0  Down, Depressed, Hopeless 0 0 0 0 0  PHQ - 2 Score 0 0 0 0 0    Relevant past medical, surgical, family and social history reviewed Past Medical History:  Diagnosis Date  . Arthritis    in back  . Cataract 2011   remove from right eye  . Dyslipidemia, goal LDL below 130 10/28/2015  . Hormone replacement therapy (postmenopausal) 10/28/2015  . Hyperlipidemia   . Hypertension   . Osteopenia   . TMJ (dislocation of temporomandibular joint)    Past Surgical History:  Procedure Laterality Date  .  MESH SLING X 15 YRS    . ANTERIOR AND POSTERIOR REPAIR    . BLADDER SUSPENSION    . BREAST ENHANCEMENT SURGERY    . BREAST SURGERY  2012   BILATERAL BREAST IMPLANT REMOVAL  . EYE SURGERY  20114   right eye  . INCONTINENCE SURGERY    . NASAL SINUS SURGERY    . NOSE SURGERY    . VAGINAL HYSTERECTOMY     still has ovaries   Family History  Problem Relation Age of Onset  . Cancer Mother        COLON AND UTERINE  . Cancer Father        PROSTATE  . Diabetes Father   . Hypertension Father   . Stroke Father   . Arthritis Sister   . Diabetes Son    Social History   Social History  . Marital status: Married    Spouse name: N/A  . Number of children: N/A  . Years of education: N/A   Occupational History  . Not on file.   Social History Main Topics  . Smoking status: Never Smoker  . Smokeless tobacco: Never Used  . Alcohol use No  . Drug use: No  . Sexual  activity: Yes    Partners: Male    Birth control/ protection: Surgical   Other Topics Concern  . Not on file   Social History Narrative  . No narrative on file    Interim medical history since last visit reviewed. Allergies and medications reviewed  Review of Systems Per HPI unless specifically indicated above     Objective:    BP 116/78   Pulse 85   Temp 97.9 F (36.6 C) (Oral)   Resp 14   Ht 5\' 4"  (1.626 m)   Wt 137 lb 3.2 oz (62.2 kg)   LMP  (LMP Unknown)   SpO2 98%   BMI 23.55 kg/m   Wt Readings from Last 3 Encounters:  12/05/16 137 lb 3.2 oz (62.2 kg)  04/01/16 140 lb 6 oz (63.7 kg)  02/15/16 142 lb (64.4 kg)    Physical Exam  Results for orders placed or performed in visit on 02/15/16  Urinalysis w microscopic + reflex cultur  Result Value Ref Range   Color, Urine CANCELED YELLOW   APPearance CANCELED CLEAR   Specific Gravity, Urine CANCELED 1.001 - 1.035   pH CANCELED 5.0 - 8.0   Glucose, UA CANCELED NEGATIVE   Bilirubin Urine CANCELED NEGATIVE   Ketones, ur CANCELED NEGATIVE   Hgb urine dipstick CANCELED NEGATIVE   Protein, ur CANCELED NEGATIVE   Nitrite CANCELED NEGATIVE   Leukocytes, UA CANCELED NEGATIVE   WBC, UA CANCELED <=5 WBC/HPF   RBC / HPF CANCELED <=2 RBC/HPF   Squamous Epithelial / LPF CANCELED <=5 HPF   Bacteria, UA CANCELED NONE SEEN HPF   Crystals CANCELED NONE SEEN HPF   Casts CANCELED NONE SEEN LPF   Yeast NONE SEEN NONE SEEN HPF      Assessment & Plan:   Problem List Items Addressed This Visit    None       Follow up plan: No Follow-up on file.  An after-visit summary was printed and given to the patient at [place].  Please see the patient instructions which may contain other information and recommendations beyond what is mentioned above in the assessment and plan.  Meds ordered this encounter  Medications  . tolterodine (DETROL LA) 4 MG 24 hr capsule    Sig: Take 4 mg by mouth daily.    No orders of the defined  types were placed in this encounter.

## 2016-12-05 NOTE — Assessment & Plan Note (Signed)
Discussed, [employer], see paperwork

## 2016-12-05 NOTE — Assessment & Plan Note (Signed)
USPSTF grade A and B recommendations reviewed with patient; age-appropriate recommendations, preventive care, screening tests, etc discussed and encouraged; healthy living encouraged; see AVS for patient education given to patient  

## 2016-12-09 ENCOUNTER — Other Ambulatory Visit: Payer: Self-pay

## 2016-12-09 DIAGNOSIS — E785 Hyperlipidemia, unspecified: Secondary | ICD-10-CM | POA: Diagnosis not present

## 2016-12-09 DIAGNOSIS — Z5181 Encounter for therapeutic drug level monitoring: Secondary | ICD-10-CM

## 2016-12-09 DIAGNOSIS — Z0000 Encounter for general adult medical examination without abnormal findings: Secondary | ICD-10-CM | POA: Diagnosis not present

## 2016-12-09 LAB — CBC WITH DIFFERENTIAL/PLATELET
BASOS ABS: 38 {cells}/uL (ref 0–200)
Basophils Relative: 1 %
EOS PCT: 7 %
Eosinophils Absolute: 266 cells/uL (ref 15–500)
HEMATOCRIT: 40.5 % (ref 35.0–45.0)
HEMOGLOBIN: 13.6 g/dL (ref 11.7–15.5)
LYMPHS ABS: 1140 {cells}/uL (ref 850–3900)
LYMPHS PCT: 30 %
MCH: 31.6 pg (ref 27.0–33.0)
MCHC: 33.6 g/dL (ref 32.0–36.0)
MCV: 94.2 fL (ref 80.0–100.0)
MPV: 10.6 fL (ref 7.5–12.5)
Monocytes Absolute: 494 cells/uL (ref 200–950)
Monocytes Relative: 13 %
NEUTROS PCT: 49 %
Neutro Abs: 1862 cells/uL (ref 1500–7800)
Platelets: 176 10*3/uL (ref 140–400)
RBC: 4.3 MIL/uL (ref 3.80–5.10)
RDW: 13.3 % (ref 11.0–15.0)
WBC: 3.8 10*3/uL (ref 3.8–10.8)

## 2016-12-09 LAB — COMPLETE METABOLIC PANEL WITH GFR
ALBUMIN: 3.9 g/dL (ref 3.6–5.1)
ALK PHOS: 49 U/L (ref 33–130)
ALT: 15 U/L (ref 6–29)
AST: 21 U/L (ref 10–35)
BUN: 20 mg/dL (ref 7–25)
CHLORIDE: 104 mmol/L (ref 98–110)
CO2: 27 mmol/L (ref 20–31)
CREATININE: 0.75 mg/dL (ref 0.60–0.93)
Calcium: 8.9 mg/dL (ref 8.6–10.4)
GFR, Est African American: >89 mL/min (ref >=60)
GFR, Est Non African American: 81 mL/min (ref >=60)
GLUCOSE: 80 mg/dL (ref 65–99)
POTASSIUM: 4.9 mmol/L (ref 3.5–5.3)
SODIUM: 141 mmol/L (ref 135–146)
Total Bilirubin: 0.3 mg/dL (ref 0.2–1.2)
Total Protein: 6.2 g/dL (ref 6.1–8.1)

## 2016-12-09 LAB — LIPID PANEL
CHOLESTEROL: 178 mg/dL (ref <200)
HDL: 53 mg/dL (ref >50)
LDL CALC: 114 mg/dL — AB (ref <100)
Total CHOL/HDL Ratio: 3.4 Ratio (ref <5.0)
Triglycerides: 54 mg/dL (ref <150)
VLDL: 11 mg/dL (ref <30)

## 2016-12-21 ENCOUNTER — Ambulatory Visit
Admission: RE | Admit: 2016-12-21 | Discharge: 2016-12-21 | Disposition: A | Discharge status: Home / Self Care | Payer: PPO | Source: Ambulatory Visit | Attending: Family Medicine | Admitting: Family Medicine

## 2016-12-21 DIAGNOSIS — Z1231 Encounter for screening mammogram for malignant neoplasm of breast: Secondary | ICD-10-CM | POA: Diagnosis not present

## 2016-12-21 DIAGNOSIS — Z1239 Encounter for other screening for malignant neoplasm of breast: Secondary | ICD-10-CM

## 2016-12-23 ENCOUNTER — Other Ambulatory Visit: Payer: Self-pay | Admitting: *Deleted

## 2016-12-23 ENCOUNTER — Inpatient Hospital Stay
Admission: RE | Admit: 2016-12-23 | Discharge: 2016-12-23 | Disposition: A | Discharge status: Home / Self Care | Payer: Self-pay | Source: Ambulatory Visit | Attending: *Deleted | Admitting: *Deleted

## 2016-12-23 DIAGNOSIS — Z9289 Personal history of other medical treatment: Secondary | ICD-10-CM

## 2016-12-23 LAB — HM MAMMOGRAPHY

## 2017-01-11 DIAGNOSIS — Z961 Presence of intraocular lens: Secondary | ICD-10-CM | POA: Diagnosis not present

## 2017-01-11 DIAGNOSIS — H40019 Open angle with borderline findings, low risk, unspecified eye: Secondary | ICD-10-CM | POA: Diagnosis not present

## 2017-01-11 DIAGNOSIS — H1045 Other chronic allergic conjunctivitis: Secondary | ICD-10-CM | POA: Diagnosis not present

## 2017-01-11 DIAGNOSIS — H04129 Dry eye syndrome of unspecified lacrimal gland: Secondary | ICD-10-CM | POA: Diagnosis not present

## 2017-01-11 DIAGNOSIS — H35039 Hypertensive retinopathy, unspecified eye: Secondary | ICD-10-CM | POA: Diagnosis not present

## 2017-01-11 DIAGNOSIS — H401130 Primary open-angle glaucoma, bilateral, stage unspecified: Secondary | ICD-10-CM | POA: Diagnosis not present

## 2017-03-09 ENCOUNTER — Telehealth: Payer: Self-pay | Admitting: Family Medicine

## 2017-03-09 ENCOUNTER — Other Ambulatory Visit: Payer: Self-pay

## 2017-03-09 ENCOUNTER — Ambulatory Visit (INDEPENDENT_AMBULATORY_CARE_PROVIDER_SITE_OTHER): Payer: PPO | Admitting: Family Medicine

## 2017-03-09 ENCOUNTER — Encounter: Payer: Self-pay | Admitting: Family Medicine

## 2017-03-09 VITALS — BP 126/82 | HR 86 | Temp 98.3°F | Resp 18 | Ht 64.0 in | Wt 139.6 lb

## 2017-03-09 DIAGNOSIS — S3992XA Unspecified injury of lower back, initial encounter: Secondary | ICD-10-CM | POA: Diagnosis not present

## 2017-03-09 DIAGNOSIS — M62830 Muscle spasm of back: Secondary | ICD-10-CM

## 2017-03-09 MED ORDER — PREDNISONE 10 MG PO TABS: 10.0000 mg | ORAL_TABLET | Freq: Every day | ORAL | 0 refills | Status: DC

## 2017-03-09 MED ORDER — TIZANIDINE HCL 2 MG PO CAPS: 2.0000 mg | ORAL_CAPSULE | Freq: Two times a day (BID) | ORAL | 0 refills | Status: DC | PRN

## 2017-03-09 MED ORDER — GABAPENTIN 300 MG PO CAPS: 300.0000 mg | ORAL_CAPSULE | Freq: Every day | ORAL | Status: DC

## 2017-03-09 MED ORDER — PREDNISONE 10 MG PO TABS: ORAL_TABLET | ORAL | 0 refills | Status: DC

## 2017-03-09 NOTE — Patient Instructions (Addendum)
Perform gentle stretching twice a day as below - do not do anything that is causing you pain. Apply heat in the mornings and ice at night as needed.  Back Exercises If you have pain in your back, do these exercises 2-3 times each day or as told by your doctor. When the pain goes away, do the exercises once each day, but repeat the steps more times for each exercise (do more repetitions). If you do not have pain in your back, do these exercises once each day or as told by your doctor. Exercises Single Knee to Chest  Do these steps 3-5 times in a row for each leg: 1. Lie on your back on a firm bed or the floor with your legs stretched out. 2. Bring one knee to your chest. 3. Hold your knee to your chest by grabbing your knee or thigh. 4. Pull on your knee until you feel a gentle stretch in your lower back. 5. Keep doing the stretch for 10-30 seconds. 6. Slowly let go of your leg and straighten it.  Pelvic Tilt  Do these steps 5-10 times in a row: 1. Lie on your back on a firm bed or the floor with your legs stretched out. 2. Bend your knees so they point up to the ceiling. Your feet should be flat on the floor. 3. Tighten your lower belly (abdomen) muscles to press your lower back against the floor. This will make your tailbone point up to the ceiling instead of pointing down to your feet or the floor. 4. Stay in this position for 5-10 seconds while you gently tighten your muscles and breathe evenly.  Cat-Cow  Do these steps until your lower back bends more easily: 1. Get on your hands and knees on a firm surface. Keep your hands under your shoulders, and keep your knees under your hips. You may put padding under your knees. 2. Let your head hang down, and make your tailbone point down to the floor so your lower back is round like the back of a cat. 3. Stay in this position for 5 seconds. 4. Slowly lift your head and make your tailbone point up to the ceiling so your back hangs low (sags)  like the back of a cow. 5. Stay in this position for 5 seconds.  Press-Ups  Do these steps 5-10 times in a row: 1. Lie on your belly (face-down) on the floor. 2. Place your hands near your head, about shoulder-width apart. 3. While you keep your back relaxed and keep your hips on the floor, slowly straighten your arms to raise the top half of your body and lift your shoulders. Do not use your back muscles. To make yourself more comfortable, you may change where you place your hands. 4. Stay in this position for 5 seconds. 5. Slowly return to lying flat on the floor.  Bridges  Do these steps 10 times in a row: 1. Lie on your back on a firm surface. 2. Bend your knees so they point up to the ceiling. Your feet should be flat on the floor. 3. Tighten your butt muscles and lift your butt off of the floor until your waist is almost as high as your knees. If you do not feel the muscles working in your butt and the back of your thighs, slide your feet 1-2 inches farther away from your butt. 4. Stay in this position for 3-5 seconds. 5. Slowly lower your butt to the floor, and let  your butt muscles relax.  If this exercise is too easy, try doing it with your arms crossed over your chest. Belly Crunches  Do these steps 5-10 times in a row: 1. Lie on your back on a firm bed or the floor with your legs stretched out. 2. Bend your knees so they point up to the ceiling. Your feet should be flat on the floor. 3. Cross your arms over your chest. 4. Tip your chin a little bit toward your chest but do not bend your neck. 5. Tighten your belly muscles and slowly raise your chest just enough to lift your shoulder blades a tiny bit off of the floor. 6. Slowly lower your chest and your head to the floor.  Back Lifts Do these steps 5-10 times in a row: 1. Lie on your belly (face-down) with your arms at your sides, and rest your forehead on the floor. 2. Tighten the muscles in your legs and your  butt. 3. Slowly lift your chest off of the floor while you keep your hips on the floor. Keep the back of your head in line with the curve in your back. Look at the floor while you do this. 4. Stay in this position for 3-5 seconds. 5. Slowly lower your chest and your face to the floor.  Contact a doctor if:  Your back pain gets a lot worse when you do an exercise.  Your back pain does not lessen 2 hours after you exercise. If you have any of these problems, stop doing the exercises. Do not do them again unless your doctor says it is okay. Get help right away if:  You have sudden, very bad back pain. If this happens, stop doing the exercises. Do not do them again unless your doctor says it is okay. This information is not intended to replace advice given to you by your health care provider. Make sure you discuss any questions you have with your health care provider. Document Released: 06/04/2010 Document Revised: 10/08/2015 Document Reviewed: 06/26/2014 Elsevier Interactive Patient Education  [employer].

## 2017-03-09 NOTE — Telephone Encounter (Signed)
Copied from [place] [phone]. Topic: Quick Communication - See Telephone Encounter >> Mar 09, 2017  5:09 PM Neva Seat wrote: CRM for notification. See Telephone encounter for:  03/09/17.  Pt called regarding missed Rx Prednisone

## 2017-03-09 NOTE — Telephone Encounter (Signed)
Resent rx, original rx was printed in office

## 2017-03-09 NOTE — Progress Notes (Signed)
Name: [name]   MRN: [id number]    DOB: March 21, 1946   Date:03/09/2017       Progress Note  Subjective  Chief Complaint  Chief Complaint  Patient presents with  . Back Pain    pilled muscle in mid back    HPI  Pt presents with c/o 3 days of back pain.  She "jumped" out of bed because she was having a cramp in her foot, she has a high sitting bed, and she slipped on her footstool and fell to the ground. She has since developed LEFT mid back pain - applying ice BID and taking Ibuprofen 400mg  BID with only minimal relief.  She also takes Celerbrex daily for OA. She denies bruising, numbness/tingling in extremities, no rib pain, no hip or pelvic or coccyx pain, no hematuria or abdominal pain. She reports pain with certain movements and feeling very stiff and having increased pain in the mornings.  Takes Gabapentin for insomnia - currently only taking 300mg  QHS.  We discussed muscle relaxers at length and the increased risk of CNS depression while taking these medications.  She leaves for [place] on a mission trip in a couple of days and is concerned that she will not be able to perform her duties while she is there.  Patient Active Problem List   Diagnosis Date Noted  . Patient is full code 12/05/2016  . Need for shingles vaccine 06/29/2016  . Insomnia 04/10/2016  . Hypertensive retinopathy 02/15/2016  . Urine incontinence 02/15/2016  . Breast cancer screening 10/28/2015  . Medication monitoring encounter 10/28/2015  . Dyslipidemia, goal LDL below 130 10/28/2015  . Hormone replacement therapy (postmenopausal) 10/28/2015  . Preventative health care 10/28/2015  . Need for hepatitis C screening test 10/28/2015  . Need for pneumococcal vaccination 10/28/2015  . Post-menopausal 10/07/2015  . Osteoporosis screening 10/07/2015  . Diarrhea, travelers' 09/08/2015  . 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 Dermatitis 05/15/2015    Social History  Substance Use Topics  . Smoking status:  Never Smoker  . Smokeless tobacco: Never Used  . Alcohol use No     Current Outpatient Prescriptions:  .  Alfalfa 250 MG TABS, Take by mouth., Disp: , Rfl:  .  aspirin 81 MG tablet, Take 81 mg by mouth daily., Disp: , Rfl:  .  calcium carbonate (OS-CAL) 600 MG TABS, Take 600 mg by mouth as needed. , Disp: , Rfl:  .  celecoxib (CELEBREX) 200 MG capsule, Take 200 mg by mouth daily. , Disp: , Rfl:  .  chloroquine (ARALEN) 250 MG tablet, Take 2 tablets (500 mg total) by mouth once a week., Disp: 30 tablet, Rfl: 0 .  cholecalciferol (VITAMIN D) 1000 UNITS tablet, Take 1,000 Units by mouth daily., Disp: , Rfl:  .  estrogens-methylTEST (ESTRATEST) 1.25-2.5 MG tablet, Take 1 tablet by mouth daily., Disp: 90 tablet, Rfl: 1 .  fish oil-omega-3 fatty acids 1000 MG capsule, Take 2 g by mouth daily., Disp: , Rfl:  .  gabapentin (NEURONTIN) 300 MG capsule, Take 1 capsule (300 mg total) by mouth 3 (three) times daily., Disp: 270 capsule, Rfl: 1 .  Multiple Vitamin (MULTIVITAMIN) capsule, Take 1 capsule by mouth daily., Disp: , Rfl:  .  multivitamin-lutein (OCUVITE-LUTEIN) CAPS, Take 1 capsule by mouth daily., Disp: , Rfl:  .  tolterodine (DETROL LA) 4 MG 24 hr capsule, Take 1 capsule (4 mg total) by mouth daily., Disp: 90 capsule, Rfl: 3 .  triamcinolone cream (KENALOG) 0.5 %, Apply 1 application  topically 3 (three) times daily. As needed, Disp: 30 g, Rfl: 0  No Known Allergies  ROS  Constitutional: Negative for fever or weight change.  Respiratory: Negative for cough and shortness of breath.   Cardiovascular: Negative for chest pain or palpitations.  Gastrointestinal: Negative for abdominal pain, no bowel changes.  Musculoskeletal: Negative for gait problem or joint swelling.  See HPI Skin: Negative for rash.  Neurological: Negative for dizziness or headache.  No other specific complaints in a complete review of systems (except as listed in HPI above).  Objective  Vitals:   03/09/17 1426  BP:  126/82  Pulse: 86  Resp: 18  Temp: 98.3 F (36.8 C)  TempSrc: Oral  SpO2: 96%  Weight: 139 lb 9.6 oz (63.3 kg)  Height: 5\' 4"  (1.626 m)   Body mass index is 23.96 kg/m.  Nursing Note and Vital Signs reviewed.  Physical Exam  Musculoskeletal:       Arms:  Constitutional: Patient appears well-developed and well-nourished. No distress.  HEENT: head atraumatic, normocephalic, pupils equal and reactive to light, EOM's intact Cardiovascular: Normal rate, regular rhythm, S1/S2 present.  No murmur or rub heard. No BLE edema. Pulmonary/Chest: Effort normal and breath sounds clear. No respiratory distress or retractions. Abdominal: Soft and non-tender, bowel sounds present x4 quadrants.  No CVA Tenderness Skin: No ecchymosis or erythema. Psychiatric: Patient has a normal mood and affect. behavior is normal. Judgment and thought content normal. Musculoskeletal: Normal range of motion of BLE and spine, BLE strength is equal and +5; no joint effusions. No gross deformities. No tenderness on palpation. Though LEFT mid-back musculature is firm there is no palpable spasm. Neurological: she is alert and oriented to person, place, and time. No cranial nerve deficit. Coordination, balance, strength, speech and gait are normal.  Skin: Skin is warm and dry. No rash noted. No erythema or echymosis.  Psychiatric: Patient has a normal mood and affect. behavior is normal. Judgment and thought content normal.   Recent Results (from the past 2160 hour(s))  HM MAMMOGRAPHY     Status: None   Collection Time: 12/23/16 12:00 AM  Result Value Ref Range   HM Mammogram 0-4 Bi-Rad 0-4 Bi-Rad, Self Reported Normal     Assessment & Plan  1. Back muscle spasm - tizanidine (ZANAFLEX) 2 MG capsule; Take 1 capsule (2 mg total) by mouth 2 (two) times daily as needed for muscle spasms.  Dispense: 20 capsule; Refill: 0 - Takes Gabapentin for insomnia - currently only taking 300mg  QHS.  We discussed muscle relaxers  at length and the increased risk of CNS depression while taking these medications, she verbalizes understanding and is agreeable to low dose Zanaflex as this has lower SE of CNS depression than others in the muscle relaxant class. - predniSONE (DELTASONE) 10 MG tablet; Take 1 tablet (10 mg total) by mouth daily with breakfast. Day1&2:6tabs, Day3&4:5tabs, Day5&6:4tabs, Day7&8:3tabs, Day9&10:2tabs, Day11&12:1tab  Dispense: 42 tablet; Refill:0 2. Back injury, initial encounter - tizanidine (ZANAFLEX) 2 MG capsule; Take 1 capsule (2 mg total) by mouth 2 (two) times daily as needed for muscle spasms.  Dispense: 20 capsule; Refill: 0 - predniSONE (DELTASONE) 10 MG tablet; Take 1 tablet (10 mg total) by mouth daily with breakfast. Day1&2:6tabs, Day3&4:5tabs, Day5&6:4tabs, Day7&8:3tabs, Day9&10:2tabs, Day11&12:1tab  Dispense: 42 tablet; Refill: 0  -Red flags and when to present for emergency care or RTC including fever >101.41F, chest pain, shortness of breath, new/worsening/un-resolving symptoms, bruising or rib pain, hematuria/urinary symptoms, tingling/numbness/weakness in any extremity, reviewed with  patient at time of visit. Follow up and care instructions discussed and provided in AVS.

## 2017-03-13 ENCOUNTER — Telehealth: Payer: Self-pay | Admitting: Family Medicine

## 2017-03-13 NOTE — Telephone Encounter (Signed)
Copied from [place] #[id number]. Topic: Quick Communication - See Telephone Encounter >> Mar 13, 2017  9:47 AM Robina Ade, Helene Kelp D wrote: CRM for notification. See Telephone encounter for: 03/13/17. Patient has questions about her tolterodine and would like to talk to nurse before she continues taking it.

## 2017-03-13 NOTE — Telephone Encounter (Signed)
Called, patient states will hold off for now is to busy will stick with current med

## 2017-03-13 NOTE — Telephone Encounter (Signed)
This pt is leaving for [place] tomorrow.  She is taking Tolterodine for urinary incontinence which for the last week has not been working.   "I'm peeing a lot again"   She was wondering if she could double the dose.   She would like an answer prior to leaving on her mission trip tomorrow.   Thanks for reaching out to her.

## 2017-03-13 NOTE — Telephone Encounter (Signed)
No, I do not recommend that she double the dose; 4 mg is the most I'd want her to take If it's not working well, we could check her urine to make sure she doesn't have a bladder infection before she goes or just switch medicine Let me know her preference

## 2017-04-27 DIAGNOSIS — M7711 Lateral epicondylitis, right elbow: Secondary | ICD-10-CM | POA: Diagnosis not present

## 2017-04-27 DIAGNOSIS — M7551 Bursitis of right shoulder: Secondary | ICD-10-CM | POA: Diagnosis not present

## 2017-04-27 DIAGNOSIS — M1711 Unilateral primary osteoarthritis, right knee: Secondary | ICD-10-CM | POA: Diagnosis not present

## 2017-05-22 DIAGNOSIS — M7711 Lateral epicondylitis, right elbow: Secondary | ICD-10-CM | POA: Diagnosis not present

## 2017-07-05 DIAGNOSIS — H35039 Hypertensive retinopathy, unspecified eye: Secondary | ICD-10-CM | POA: Diagnosis not present

## 2017-07-05 DIAGNOSIS — H1045 Other chronic allergic conjunctivitis: Secondary | ICD-10-CM | POA: Diagnosis not present

## 2017-07-05 DIAGNOSIS — H04129 Dry eye syndrome of unspecified lacrimal gland: Secondary | ICD-10-CM | POA: Diagnosis not present

## 2017-07-05 DIAGNOSIS — Z961 Presence of intraocular lens: Secondary | ICD-10-CM | POA: Diagnosis not present

## 2017-07-05 DIAGNOSIS — H401130 Primary open-angle glaucoma, bilateral, stage unspecified: Secondary | ICD-10-CM | POA: Diagnosis not present

## 2017-07-19 DIAGNOSIS — L570 Actinic keratosis: Secondary | ICD-10-CM | POA: Diagnosis not present

## 2017-07-19 DIAGNOSIS — X32XXXA Exposure to sunlight, initial encounter: Secondary | ICD-10-CM | POA: Diagnosis not present

## 2017-07-19 DIAGNOSIS — D225 Melanocytic nevi of trunk: Secondary | ICD-10-CM | POA: Diagnosis not present

## 2017-07-19 DIAGNOSIS — Z85828 Personal history of other malignant neoplasm of skin: Secondary | ICD-10-CM | POA: Diagnosis not present

## 2017-07-19 DIAGNOSIS — D2272 Melanocytic nevi of left lower limb, including hip: Secondary | ICD-10-CM | POA: Diagnosis not present

## 2017-07-19 DIAGNOSIS — D2261 Melanocytic nevi of right upper limb, including shoulder: Secondary | ICD-10-CM | POA: Diagnosis not present

## 2017-07-31 ENCOUNTER — Other Ambulatory Visit: Payer: Self-pay | Admitting: Family Medicine

## 2017-07-31 NOTE — Telephone Encounter (Signed)
Pt following up on refill request estrogens-methylTEST (ESTRATEST) 1.25-2.5 MG tablet  Pt states she found out she can get this med at [facility] a LOT cheaper. Would like you to disregard request from [facility] and send  90 day to [place], [place]

## 2017-07-31 NOTE — Telephone Encounter (Signed)
Please see below and advise. Pharmacy changed accordingly.

## 2017-08-01 MED ORDER — EST ESTROGENS-METHYLTEST 1.25-2.5 MG PO TABS: 1.0000 | ORAL_TABLET | Freq: Every day | ORAL | 1 refills | Status: DC

## 2017-10-30 ENCOUNTER — Other Ambulatory Visit: Payer: Self-pay | Admitting: Family Medicine

## 2017-11-27 DIAGNOSIS — M1711 Unilateral primary osteoarthritis, right knee: Secondary | ICD-10-CM | POA: Diagnosis not present

## 2017-12-08 ENCOUNTER — Encounter: Payer: PPO | Admitting: Family Medicine

## 2017-12-12 ENCOUNTER — Encounter: Payer: Self-pay | Admitting: Family Medicine

## 2017-12-12 ENCOUNTER — Ambulatory Visit (INDEPENDENT_AMBULATORY_CARE_PROVIDER_SITE_OTHER): Payer: PPO | Admitting: Family Medicine

## 2017-12-12 VITALS — BP 130/70 | HR 78 | Temp 97.6°F | Resp 16 | Ht 64.0 in | Wt 135.2 lb

## 2017-12-12 DIAGNOSIS — Z0000 Encounter for general adult medical examination without abnormal findings: Secondary | ICD-10-CM | POA: Diagnosis not present

## 2017-12-12 DIAGNOSIS — K219 Gastro-esophageal reflux disease without esophagitis: Secondary | ICD-10-CM | POA: Diagnosis not present

## 2017-12-12 DIAGNOSIS — M8580 Other specified disorders of bone density and structure, unspecified site: Secondary | ICD-10-CM | POA: Diagnosis not present

## 2017-12-12 DIAGNOSIS — E785 Hyperlipidemia, unspecified: Secondary | ICD-10-CM

## 2017-12-12 DIAGNOSIS — Z79890 Hormone replacement therapy (postmenopausal): Secondary | ICD-10-CM | POA: Diagnosis not present

## 2017-12-12 DIAGNOSIS — Z5181 Encounter for therapeutic drug level monitoring: Secondary | ICD-10-CM | POA: Diagnosis not present

## 2017-12-12 MED ORDER — OMEPRAZOLE 20 MG PO CPDR: 20.0000 mg | DELAYED_RELEASE_CAPSULE | Freq: Two times a day (BID) | ORAL | 0 refills | Status: DC

## 2017-12-12 NOTE — Patient Instructions (Addendum)
Return the stool cards next week if possible Start the new medicine Let me know if the hand becomes bothersome and we can refer you to the orthopaedist Please do call to schedule your bone density study; the number to schedule one at either [facility] or [facility] is [phone] or [phone] That is due in late November  Health Maintenance  Topic Date Due  . INFLUENZA VACCINE  12/14/2017  . MAMMOGRAM  12/23/2017  . DEXA SCAN  04/11/2018  . COLONOSCOPY  11/25/2018  . TETANUS/TDAP  03/12/2023  . Hepatitis C Screening  Completed  . PNA vac Low Risk Adult  Completed   We'll get the labs today If you have not heard anything from my staff in a week about any orders/referrals/studies from today, please contact us here to follow-up [phone]  Health Maintenance for Postmenopausal Women Menopause is a normal process in which your reproductive ability comes to an end. This process happens gradually over a span of months to years, usually between the ages of 28 and 67. Menopause is complete when you have missed 12 consecutive menstrual periods. It is important to talk with your health care provider about some of the most common conditions that affect postmenopausal women, such as heart disease, cancer, and bone loss (osteoporosis). Adopting a healthy lifestyle and getting preventive care can help to promote your health and wellness. Those actions can also lower your chances of developing some of these common conditions. What should I know about menopause? During menopause, you may experience a number of symptoms, such as:  Moderate-to-severe hot flashes.  Night sweats.  Decrease in sex drive.  Mood swings.  Headaches.  Tiredness.  Irritability.  Memory problems.  Insomnia.  Choosing to treat or not to treat menopausal changes is an individual decision that you make with your health care provider. What should I know about hormone replacement  therapy and supplements? Hormone therapy products are effective for treating symptoms that are associated with menopause, such as hot flashes and night sweats. Hormone replacement carries certain risks, especially as you become older. If you are thinking about using estrogen or estrogen with progestin treatments, discuss the benefits and risks with your health care provider. What should I know about heart disease and stroke? Heart disease, heart attack, and stroke become more likely as you age. This may be due, in part, to the hormonal changes that your body experiences during menopause. These can affect how your body processes dietary fats, triglycerides, and cholesterol. Heart attack and stroke are both medical emergencies. There are many things that you can do to help prevent heart disease and stroke:  Have your blood pressure checked at least every 1-2 years. High blood pressure causes heart disease and increases the risk of stroke.  If you are 65-64 years old, ask your health care provider if you should take aspirin to prevent a heart attack or a stroke.  Do not use any tobacco products, including cigarettes, chewing tobacco, or electronic cigarettes. If you need help quitting, ask your health care provider.  It is important to eat a healthy diet and maintain a healthy weight. ? Be sure to include plenty of vegetables, fruits, low-fat dairy products, and lean protein. ? Avoid eating foods that are high in solid fats, added sugars, or salt (sodium).  Get regular exercise. This is one of the most important things that you can do for your health. ? Try to exercise for at least 150 minutes each  week. The type of exercise that you do should increase your heart rate and make you sweat. This is known as moderate-intensity exercise. ? Try to do strengthening exercises at least twice each week. Do these in addition to the moderate-intensity exercise.  Know your numbers.Ask your health care provider  to check your cholesterol and your blood glucose. Continue to have your blood tested as directed by your health care provider.  What should I know about cancer screening? There are several types of cancer. Take the following steps to reduce your risk and to catch any cancer development as early as possible. Breast Cancer  Practice breast self-awareness. ? This means understanding how your breasts normally appear and feel. ? It also means doing regular breast self-exams. Let your health care provider know about any changes, no matter how small.  If you are 61 or older, have a clinician do a breast exam (clinical breast exam or CBE) every year. Depending on your age, family history, and medical history, it may be recommended that you also have a yearly breast X-ray (mammogram).  If you have a family history of breast cancer, talk with your health care provider about genetic screening.  If you are at high risk for breast cancer, talk with your health care provider about having an MRI and a mammogram every year.  Breast cancer (BRCA) gene test is recommended for women who have family members with BRCA-related cancers. Results of the assessment will determine the need for genetic counseling and BRCA1 and for BRCA2 testing. BRCA-related cancers include these types: ? Breast. This occurs in males or females. ? Ovarian. ? Tubal. This may also be called fallopian tube cancer. ? Cancer of the abdominal or pelvic lining (peritoneal cancer). ? Prostate. ? Pancreatic.  Cervical, Uterine, and Ovarian Cancer Your health care provider may recommend that you be screened regularly for cancer of the pelvic organs. These include your ovaries, uterus, and vagina. This screening involves a pelvic exam, which includes checking for microscopic changes to the surface of your cervix (Pap test).  For women ages 21-65, health care providers may recommend a pelvic exam and a Pap test every three years. For women ages  55-65, they may recommend the Pap test and pelvic exam, combined with testing for human papilloma virus (HPV), every five years. Some types of HPV increase your risk of cervical cancer. Testing for HPV may also be done on women of any age who have unclear Pap test results.  Other health care providers may not recommend any screening for nonpregnant women who are considered low risk for pelvic cancer and have no symptoms. Ask your health care provider if a screening pelvic exam is right for you.  If you have had past treatment for cervical cancer or a condition that could lead to cancer, you need Pap tests and screening for cancer for at least 20 years after your treatment. If Pap tests have been discontinued for you, your risk factors (such as having a new sexual partner) need to be reassessed to determine if you should start having screenings again. Some women have medical problems that increase the chance of getting cervical cancer. In these cases, your health care provider may recommend that you have screening and Pap tests more often.  If you have a family history of uterine cancer or ovarian cancer, talk with your health care provider about genetic screening.  If you have vaginal bleeding after reaching menopause, tell your health care provider.  There are currently  no reliable tests available to screen for ovarian cancer.  Lung Cancer Lung cancer screening is recommended for adults 33-76 years old who are at high risk for lung cancer because of a history of smoking. A yearly low-dose CT scan of the lungs is recommended if you:  Currently smoke.  Have a history of at least 30 pack-years of smoking and you currently smoke or have quit within the past 15 years. A pack-year is smoking an average of one pack of cigarettes per day for one year.  Yearly screening should:  Continue until it has been 15 years since you quit.  Stop if you develop a health problem that would prevent you from having  lung cancer treatment.  Colorectal Cancer  This type of cancer can be detected and can often be prevented.  Routine colorectal cancer screening usually begins at age 92 and continues through age 88.  If you have risk factors for colon cancer, your health care provider may recommend that you be screened at an earlier age.  If you have a family history of colorectal cancer, talk with your health care provider about genetic screening.  Your health care provider may also recommend using home test kits to check for hidden blood in your stool.  A small camera at the end of a tube can be used to examine your colon directly (sigmoidoscopy or colonoscopy). This is done to check for the earliest forms of colorectal cancer.  Direct examination of the colon should be repeated every 5-10 years until age 41. However, if early forms of precancerous polyps or small growths are found or if you have a family history or genetic risk for colorectal cancer, you may need to be screened more often.  Skin Cancer  Check your skin from head to toe regularly.  Monitor any moles. Be sure to tell your health care provider: ? About any new moles or changes in moles, especially if there is a change in a mole's shape or color. ? If you have a mole that is larger than the size of a pencil eraser.  If any of your family members has a history of skin cancer, especially at a young age, talk with your health care provider about genetic screening.  Always use sunscreen. Apply sunscreen liberally and repeatedly throughout the day.  Whenever you are outside, protect yourself by wearing long sleeves, pants, a wide-brimmed hat, and sunglasses.  What should I know about osteoporosis? Osteoporosis is a condition in which bone destruction happens more quickly than new bone creation. After menopause, you may be at an increased risk for osteoporosis. To help prevent osteoporosis or the bone fractures that can happen because of  osteoporosis, the following is recommended:  If you are 75-58 years old, get at least 1,000 mg of calcium and at least 600 mg of vitamin D per day.  If you are older than age 60 but younger than age 83, get at least 1,200 mg of calcium and at least 600 mg of vitamin D per day.  If you are older than age 43, get at least 1,200 mg of calcium and at least 800 mg of vitamin D per day.  Smoking and excessive alcohol intake increase the risk of osteoporosis. Eat foods that are rich in calcium and vitamin D, and do weight-bearing exercises several times each week as directed by your health care provider. What should I know about how menopause affects my mental health? Depression may occur at any age, but it  is more common as you become older. Common symptoms of depression include:  Low or sad mood.  Changes in sleep patterns.  Changes in appetite or eating patterns.  Feeling an overall lack of motivation or enjoyment of activities that you previously enjoyed.  Frequent crying spells.  Talk with your health care provider if you think that you are experiencing depression. What should I know about immunizations? It is important that you get and maintain your immunizations. These include:  Tetanus, diphtheria, and pertussis (Tdap) booster vaccine.  Influenza every year before the flu season begins.  Pneumonia vaccine.  Shingles vaccine.  Your health care provider may also recommend other immunizations. This information is not intended to replace advice given to you by your health care provider. Make sure you discuss any questions you have with your health care provider. Document Released: 06/24/2005 Document Revised: 11/20/2015 Document Reviewed: 02/03/2015 Elsevier Interactive Patient Education  2018 [employer] Maintenance for Postmenopausal Women Menopause is a normal process in which your reproductive ability comes to an end. This process happens gradually over a span of  months to years, usually between the ages of 80 and 24. Menopause is complete when you have missed 12 consecutive menstrual periods. It is important to talk with your health care provider about some of the most common conditions that affect postmenopausal women, such as heart disease, cancer, and bone loss (osteoporosis). Adopting a healthy lifestyle and getting preventive care can help to promote your health and wellness. Those actions can also lower your chances of developing some of these common conditions. What should I know about menopause? During menopause, you may experience a number of symptoms, such as:  Moderate-to-severe hot flashes.  Night sweats.  Decrease in sex drive.  Mood swings.  Headaches.  Tiredness.  Irritability.  Memory problems.  Insomnia.  Choosing to treat or not to treat menopausal changes is an individual decision that you make with your health care provider. What should I know about hormone replacement therapy and supplements? Hormone therapy products are effective for treating symptoms that are associated with menopause, such as hot flashes and night sweats. Hormone replacement carries certain risks, especially as you become older. If you are thinking about using estrogen or estrogen with progestin treatments, discuss the benefits and risks with your health care provider. What should I know about heart disease and stroke? Heart disease, heart attack, and stroke become more likely as you age. This may be due, in part, to the hormonal changes that your body experiences during menopause. These can affect how your body processes dietary fats, triglycerides, and cholesterol. Heart attack and stroke are both medical emergencies. There are many things that you can do to help prevent heart disease and stroke:  Have your blood pressure checked at least every 1-2 years. High blood pressure causes heart disease and increases the risk of stroke.  If you are 103-70  years old, ask your health care provider if you should take aspirin to prevent a heart attack or a stroke.  Do not use any tobacco products, including cigarettes, chewing tobacco, or electronic cigarettes. If you need help quitting, ask your health care provider.  It is important to eat a healthy diet and maintain a healthy weight. ? Be sure to include plenty of vegetables, fruits, low-fat dairy products, and lean protein. ? Avoid eating foods that are high in solid fats, added sugars, or salt (sodium).  Get regular exercise. This is one of the most important things that  you can do for your health. ? Try to exercise for at least 150 minutes each week. The type of exercise that you do should increase your heart rate and make you sweat. This is known as moderate-intensity exercise. ? Try to do strengthening exercises at least twice each week. Do these in addition to the moderate-intensity exercise.  Know your numbers.Ask your health care provider to check your cholesterol and your blood glucose. Continue to have your blood tested as directed by your health care provider.  What should I know about cancer screening? There are several types of cancer. Take the following steps to reduce your risk and to catch any cancer development as early as possible. Breast Cancer  Practice breast self-awareness. ? This means understanding how your breasts normally appear and feel. ? It also means doing regular breast self-exams. Let your health care provider know about any changes, no matter how small.  If you are 36 or older, have a clinician do a breast exam (clinical breast exam or CBE) every year. Depending on your age, family history, and medical history, it may be recommended that you also have a yearly breast X-ray (mammogram).  If you have a family history of breast cancer, talk with your health care provider about genetic screening.  If you are at high risk for breast cancer, talk with your health  care provider about having an MRI and a mammogram every year.  Breast cancer (BRCA) gene test is recommended for women who have family members with BRCA-related cancers. Results of the assessment will determine the need for genetic counseling and BRCA1 and for BRCA2 testing. BRCA-related cancers include these types: ? Breast. This occurs in males or females. ? Ovarian. ? Tubal. This may also be called fallopian tube cancer. ? Cancer of the abdominal or pelvic lining (peritoneal cancer). ? Prostate. ? Pancreatic.  Cervical, Uterine, and Ovarian Cancer Your health care provider may recommend that you be screened regularly for cancer of the pelvic organs. These include your ovaries, uterus, and vagina. This screening involves a pelvic exam, which includes checking for microscopic changes to the surface of your cervix (Pap test).  For women ages 21-65, health care providers may recommend a pelvic exam and a Pap test every three years. For women ages 39-65, they may recommend the Pap test and pelvic exam, combined with testing for human papilloma virus (HPV), every five years. Some types of HPV increase your risk of cervical cancer. Testing for HPV may also be done on women of any age who have unclear Pap test results.  Other health care providers may not recommend any screening for nonpregnant women who are considered low risk for pelvic cancer and have no symptoms. Ask your health care provider if a screening pelvic exam is right for you.  If you have had past treatment for cervical cancer or a condition that could lead to cancer, you need Pap tests and screening for cancer for at least 20 years after your treatment. If Pap tests have been discontinued for you, your risk factors (such as having a new sexual partner) need to be reassessed to determine if you should start having screenings again. Some women have medical problems that increase the chance of getting cervical cancer. In these cases, your  health care provider may recommend that you have screening and Pap tests more often.  If you have a family history of uterine cancer or ovarian cancer, talk with your health care provider about genetic screening.  If  you have vaginal bleeding after reaching menopause, tell your health care provider.  There are currently no reliable tests available to screen for ovarian cancer.  Lung Cancer Lung cancer screening is recommended for adults 47-70 years old who are at high risk for lung cancer because of a history of smoking. A yearly low-dose CT scan of the lungs is recommended if you:  Currently smoke.  Have a history of at least 30 pack-years of smoking and you currently smoke or have quit within the past 15 years. A pack-year is smoking an average of one pack of cigarettes per day for one year.  Yearly screening should:  Continue until it has been 15 years since you quit.  Stop if you develop a health problem that would prevent you from having lung cancer treatment.  Colorectal Cancer  This type of cancer can be detected and can often be prevented.  Routine colorectal cancer screening usually begins at age 85 and continues through age 75.  If you have risk factors for colon cancer, your health care provider may recommend that you be screened at an earlier age.  If you have a family history of colorectal cancer, talk with your health care provider about genetic screening.  Your health care provider may also recommend using home test kits to check for hidden blood in your stool.  A small camera at the end of a tube can be used to examine your colon directly (sigmoidoscopy or colonoscopy). This is done to check for the earliest forms of colorectal cancer.  Direct examination of the colon should be repeated every 5-10 years until age 39. However, if early forms of precancerous polyps or small growths are found or if you have a family history or genetic risk for colorectal cancer, you  may need to be screened more often.  Skin Cancer  Check your skin from head to toe regularly.  Monitor any moles. Be sure to tell your health care provider: ? About any new moles or changes in moles, especially if there is a change in a mole's shape or color. ? If you have a mole that is larger than the size of a pencil eraser.  If any of your family members has a history of skin cancer, especially at a young age, talk with your health care provider about genetic screening.  Always use sunscreen. Apply sunscreen liberally and repeatedly throughout the day.  Whenever you are outside, protect yourself by wearing long sleeves, pants, a wide-brimmed hat, and sunglasses.  What should I know about osteoporosis? Osteoporosis is a condition in which bone destruction happens more quickly than new bone creation. After menopause, you may be at an increased risk for osteoporosis. To help prevent osteoporosis or the bone fractures that can happen because of osteoporosis, the following is recommended:  If you are 70-56 years old, get at least 1,000 mg of calcium and at least 600 mg of vitamin D per day.  If you are older than age 42 but younger than age 33, get at least 1,200 mg of calcium and at least 600 mg of vitamin D per day.  If you are older than age 64, get at least 1,200 mg of calcium and at least 800 mg of vitamin D per day.  Smoking and excessive alcohol intake increase the risk of osteoporosis. Eat foods that are rich in calcium and vitamin D, and do weight-bearing exercises several times each week as directed by your health care provider. What should I know  about how menopause affects my mental health? Depression may occur at any age, but it is more common as you become older. Common symptoms of depression include:  Low or sad mood.  Changes in sleep patterns.  Changes in appetite or eating patterns.  Feeling an overall lack of motivation or enjoyment of activities that you  previously enjoyed.  Frequent crying spells.  Talk with your health care provider if you think that you are experiencing depression. What should I know about immunizations? It is important that you get and maintain your immunizations. These include:  Tetanus, diphtheria, and pertussis (Tdap) booster vaccine.  Influenza every year before the flu season begins.  Pneumonia vaccine.  Shingles vaccine.  Your health care provider may also recommend other immunizations. This information is not intended to replace advice given to you by your health care provider. Make sure you discuss any questions you have with your health care provider. Document Released: 06/24/2005 Document Revised: 11/20/2015 Document Reviewed: 02/03/2015 Elsevier Interactive Patient Education  2018 [employer].

## 2017-12-12 NOTE — Assessment & Plan Note (Addendum)
Fall risk prevention, calcium, DEXA in Nov

## 2017-12-12 NOTE — Assessment & Plan Note (Signed)
Patient presents for yearly physical USPSTF grade A and B recommendations reviewed with patient; age-appropriate recommendations, preventive care, screening tests, etc discussed and encouraged; healthy living encouraged; see AVS for patient education given to patient

## 2017-12-12 NOTE — Assessment & Plan Note (Signed)
Patient believes this improves QOL, bone density; does not want to stop even though I discussed; she is aware of risk of lipid and breast cancer issues

## 2017-12-12 NOTE — Progress Notes (Signed)
Patient ID: [name], female   DOB: November 05, 1945, 72 y.o.   MRN: [id number]   Subjective:   [name] is a 71 y.o. female here for a complete physical exam  Interim issues since last visit: Patient notes increase in indigestion states its happening a few times a week, resolved with tums. No certain foods; no hx of H pylori; no blood in the stool; no strong fam hx   Patient notes right palm knot in hand- noticed a few months ago, non-painful, hasn't changed in size or caused weakness. Been in the left as well; no alcohol; right handed  USPSTF grade A and B recommendations Depression:  Depression screen [facility] 2/9 12/12/2017 12/05/2016 04/01/2016 02/15/2016 10/28/2015  Decreased Interest 0 0 0 0 0  Down, Depressed, Hopeless 0 0 0 0 0  PHQ - 2 Score 0 0 0 0 0   Hypertension: BP Readings from Last 3 Encounters:  12/12/17 130/70  03/09/17 126/82  12/05/16 116/78   Obesity: Wt Readings from Last 3 Encounters:  12/12/17 135 lb 3.2 oz (61.3 kg)  03/09/17 139 lb 9.6 oz (63.3 kg)  12/05/16 137 lb 3.2 oz (62.2 kg)   BMI Readings from Last 3 Encounters:  12/12/17 23.21 kg/m  03/09/17 23.96 kg/m  12/05/16 23.55 kg/m    Skin cancer: patient has had multiple precancerous lesions removed. Does wear sunscreen. Sees the dermatologist every year Lung cancer:  Never smoked, no known chemical exposure.  Breast cancer: no personal or family history of breast cancer.; last mammogram 12/31/2016- negative  Patient does not want to get off of the estrogen, testosterone; she is taking it daily now Urinary incontinence; meds totally take care of that Colorectal cancer: last colonoscopy in 2015. Every five years BRCA gene screening: family hx of breast and/or ovarian cancer and/or metastatic prostate cancer? No; mother had uterine cancer, then colon cancer with mets HIV, hep B, hep C: done STD testing and prevention (chl/gon/syphilis): n/a Intimate partner violence: no  abuse Contraception: n/a Osteoporosis: some calcium, DEXA in Nov Fall prevention/vitamin D: discussed Immunizations: shingrix received both; pneumonias are UTD; tetanus UTD Diet: good eater Exercise: loves exercise Alcohol: no Tobacco use: never AAA: no fam hx Aspirin: stop after 70 Glucose:  Glucose  Date Value Ref Range Status  10/29/2015 83 65 - 99 mg/dL Final  10/27/2014 79 65 - 99 mg/dL Final   Glucose, Bld  Date Value Ref Range Status  12/12/2017 73 65 - 139 mg/dL Final    Comment:    .        Non-fasting reference interval .   12/09/2016 80 65 - 99 mg/dL Final   Lipids:  Lab Results  Component Value Date   CHOL 210 (H) 12/12/2017   CHOL 178 12/09/2016   CHOL 219 (H) 10/29/2015   Lab Results  Component Value Date   HDL 64 12/12/2017   HDL 53 12/09/2016   HDL 71 10/29/2015   Lab Results  Component Value Date   LDLCALC 132 (H) 12/12/2017   LDLCALC 114 (H) 12/09/2016   LDLCALC 137 (H) 10/29/2015   Lab Results  Component Value Date   TRIG 50 12/12/2017   TRIG 54 12/09/2016   TRIG 54 10/29/2015   Lab Results  Component Value Date   CHOLHDL 3.3 12/12/2017   CHOLHDL 3.4 12/09/2016   No results found for: LDLDIRECT   Past Medical History:  Diagnosis Date  . Arthritis    in back  . Cataract 2011  remove from right eye  . Dyslipidemia, goal LDL below 130 10/28/2015  . Hormone replacement therapy (postmenopausal) 10/28/2015  . Hyperlipidemia   . Hypertension   . Osteopenia   . Patient is full code 12/05/2016  . TMJ (dislocation of temporomandibular joint)    Past Surgical History:  Procedure Laterality Date  .  MESH SLING X 15 YRS    . ANTERIOR AND POSTERIOR REPAIR    . AUGMENTATION MAMMAPLASTY Bilateral    20 yrs ago , then replaced 5 yrs ago  . BLADDER SUSPENSION    . BREAST ENHANCEMENT SURGERY    . BREAST SURGERY  2012   BILATERAL BREAST IMPLANT REMOVAL  . EYE SURGERY  20114   right eye  . INCONTINENCE SURGERY    . NASAL SINUS SURGERY     . NOSE SURGERY    . VAGINAL HYSTERECTOMY     still has ovaries   Family History  Problem Relation Age of Onset  . Cancer Mother        COLON AND UTERINE  . Cancer Father        PROSTATE  . Diabetes Father   . Hypertension Father   . Stroke Father   . Arthritis Sister   . Diabetes Son   . Breast cancer Neg Hx    Social History   Tobacco Use  . Smoking status: Never Smoker  . Smokeless tobacco: Never Used  Substance Use Topics  . Alcohol use: No  . Drug use: No   Review of Systems  Constitutional: Negative for chills and fever.  HENT: Negative for hearing loss.   Eyes: Positive for visual disturbance (spots in the front of her eyes; will see eye doctor; pt will schedule).  Cardiovascular: Negative for chest pain.  Gastrointestinal: Negative for blood in stool.  Endocrine: Negative for polydipsia.  Genitourinary: Negative for hematuria.  Musculoskeletal: Negative for joint swelling.  Skin:       No new scary moles; sees derm  Allergic/Immunologic: Negative for food allergies.  Hematological: Does not bruise/bleed easily.  Psychiatric/Behavioral: Negative for dysphoric mood.    Objective:   Vitals:   12/12/17 0950  BP: 130/70  Pulse: 78  Resp: 16  Temp: 97.6 F (36.4 C)  TempSrc: Oral  SpO2: 98%  Weight: 135 lb 3.2 oz (61.3 kg)  Height: '5\' 4"'  (1.626 m)   Body mass index is 23.21 kg/m. Wt Readings from Last 3 Encounters:  12/12/17 135 lb 3.2 oz (61.3 kg)  03/09/17 139 lb 9.6 oz (63.3 kg)  12/05/16 137 lb 3.2 oz (62.2 kg)   Physical Exam  Constitutional: She appears well-developed and well-nourished.  HENT:  Head: Normocephalic and atraumatic.  Right Ear: Hearing, tympanic membrane, external ear and ear canal normal.  Left Ear: Hearing, tympanic membrane, external ear and ear canal normal.  Eyes: Conjunctivae and EOM are normal. Right eye exhibits no hordeolum. Left eye exhibits no hordeolum. No scleral icterus.  Neck: Carotid bruit is not present.  No thyromegaly present.  Cardiovascular: Normal rate, regular rhythm, S1 normal, S2 normal and normal heart sounds.  No extrasystoles are present.  Pulmonary/Chest: Effort normal and breath sounds normal. No respiratory distress. Right breast exhibits no inverted nipple, no mass, no nipple discharge, no skin change and no tenderness. Left breast exhibits no inverted nipple, no mass, no nipple discharge, no skin change and no tenderness. Breasts are symmetrical.  Breast implants bilaterally  Abdominal: Soft. Normal appearance and bowel sounds are normal. She exhibits no distension,  no abdominal bruit, no pulsatile midline mass and no mass. There is no hepatosplenomegaly. There is no tenderness. No hernia.  Musculoskeletal: Normal range of motion. She exhibits no edema.  Lymphadenopathy:       Head (right side): No submandibular adenopathy present.       Head (left side): No submandibular adenopathy present.    She has no cervical adenopathy.    She has no axillary adenopathy.  Neurological: She is alert. She displays no tremor. No cranial nerve deficit. She exhibits normal muscle tone. Gait normal.  Reflex Scores:      Patellar reflexes are 2+ on the right side and 2+ on the left side. Skin: Skin is warm and dry. No bruising and no ecchymosis noted. No cyanosis. No pallor.  Psychiatric: Her speech is normal and behavior is normal. Thought content normal. Her mood appears not anxious. She does not exhibit a depressed mood.    Assessment/Plan:   Problem List Items Addressed This Visit      Musculoskeletal and Integument   Osteopenia    Fall risk prevention, calcium, DEXA in Nov      Relevant Orders   DG Bone Density     Other   Medication monitoring encounter   Relevant Orders   COMPLETE METABOLIC PANEL WITH GFR (Completed)   CBC with Differential/Platelet (Completed)   Preventative health care - Primary    Patient presents for yearly physical USPSTF grade A and B recommendations  reviewed with patient; age-appropriate recommendations, preventive care, screening tests, etc discussed and encouraged; healthy living encouraged; see AVS for patient education given to patient       Hormone replacement therapy (HRT)    Patient believes this improves QOL, bone density; does not want to stop even though I discussed; she is aware of risk of lipid and breast cancer issues      Dyslipidemia, goal LDL below 100   Relevant Orders   Lipid panel (Completed)    Other Visit Diagnoses    Gastroesophageal reflux disease, esophagitis presence not specified       Relevant Medications   omeprazole (PRILOSEC) 20 MG capsule   Other Relevant Orders   H. pylori breath test   CBC with Differential/Platelet (Completed)       Meds ordered this encounter  Medications  . omeprazole (PRILOSEC) 20 MG capsule    Sig: Take 1 capsule (20 mg total) by mouth 2 (two) times daily before a meal.    Dispense:  60 capsule    Refill:  0   Orders Placed This Encounter  Procedures  . DG Bone Density    Standing Status:   Future    Standing Expiration Date:   12/13/2018    Order Specific Question:   Reason for Exam (SYMPTOM  OR DIAGNOSIS REQUIRED)    Answer:   osteopenia    Order Specific Question:   Preferred imaging location?    Answer:   [facility]  . H. pylori breath test  . COMPLETE METABOLIC PANEL WITH GFR  . Lipid panel  . CBC with Differential/Platelet  . H. pylori breath test    Follow up plan: Return in about 1 year (around 12/13/2018) for complete physical if still covered.  An After Visit Summary was printed and given to the patient.

## 2017-12-13 ENCOUNTER — Other Ambulatory Visit: Payer: Self-pay | Admitting: Family Medicine

## 2017-12-13 DIAGNOSIS — Z1231 Encounter for screening mammogram for malignant neoplasm of breast: Secondary | ICD-10-CM

## 2017-12-13 LAB — COMPLETE METABOLIC PANEL WITH GFR
AG Ratio: 1.9 (calc) (ref 1.0–2.5)
ALBUMIN MSPROF: 4.3 g/dL (ref 3.6–5.1)
ALKALINE PHOSPHATASE (APISO): 54 U/L (ref 33–130)
ALT: 15 U/L (ref 6–29)
AST: 23 U/L (ref 10–35)
BILIRUBIN TOTAL: 0.5 mg/dL (ref 0.2–1.2)
BUN: 18 mg/dL (ref 7–25)
CO2: 30 mmol/L (ref 20–32)
Calcium: 9.2 mg/dL (ref 8.6–10.4)
Chloride: 102 mmol/L (ref 98–110)
Creat: 0.69 mg/dL (ref 0.60–0.93)
GFR, Est African American: 102 mL/min/{1.73_m2} (ref >=60)
GFR, Est Non African American: 88 mL/min/{1.73_m2} (ref >=60)
Globulin: 2.3 g/dL (calc) (ref 1.9–3.7)
Glucose, Bld: 73 mg/dL (ref 65–139)
POTASSIUM: 4.4 mmol/L (ref 3.5–5.3)
Sodium: 138 mmol/L (ref 135–146)
Total Protein: 6.6 g/dL (ref 6.1–8.1)

## 2017-12-13 LAB — LIPID PANEL
CHOL/HDL RATIO: 3.3 (calc) (ref <5.0)
CHOLESTEROL: 210 mg/dL — AB (ref <200)
HDL: 64 mg/dL (ref >50)
LDL Cholesterol (Calc): 132 mg/dL (calc) — ABNORMAL HIGH
Non-HDL Cholesterol (Calc): 146 mg/dL (calc) — ABNORMAL HIGH (ref <130)
Triglycerides: 50 mg/dL (ref <150)

## 2017-12-13 LAB — CBC WITH DIFFERENTIAL/PLATELET
Basophils Absolute: 59 cells/uL (ref 0–200)
Basophils Relative: 0.9 %
EOS ABS: 72 {cells}/uL (ref 15–500)
Eosinophils Relative: 1.1 %
HCT: 43.3 % (ref 35.0–45.0)
HEMOGLOBIN: 14.7 g/dL (ref 11.7–15.5)
Lymphs Abs: 1365 cells/uL (ref 850–3900)
MCH: 31.5 pg (ref 27.0–33.0)
MCHC: 33.9 g/dL (ref 32.0–36.0)
MCV: 92.9 fL (ref 80.0–100.0)
MONOS PCT: 10 %
MPV: 10.5 fL (ref 7.5–12.5)
NEUTROS ABS: 4355 {cells}/uL (ref 1500–7800)
Neutrophils Relative %: 67 %
Platelets: 190 10*3/uL (ref 140–400)
RBC: 4.66 10*6/uL (ref 3.80–5.10)
RDW: 11.9 % (ref 11.0–15.0)
Total Lymphocyte: 21 %
WBC: 6.5 10*3/uL (ref 3.8–10.8)
WBCMIX: 650 {cells}/uL (ref 200–950)

## 2017-12-13 LAB — H. PYLORI BREATH TEST: H. pylori Breath Test: DETECTED — AB

## 2017-12-13 MED ORDER — AMOXICILLIN 500 MG PO CAPS: 1000.0000 mg | ORAL_CAPSULE | Freq: Two times a day (BID) | ORAL | 0 refills | Status: AC

## 2017-12-13 MED ORDER — CLARITHROMYCIN 500 MG PO TABS: 500.0000 mg | ORAL_TABLET | Freq: Two times a day (BID) | ORAL | 0 refills | Status: AC

## 2017-12-13 NOTE — Progress Notes (Signed)
The 10-year ASCVD risk score Mikey Bussing DC Brooke Bonito., et al., 2013) is: 10.7%   Values used to calculate the score:     Age: 72 years     Sex: Female     Is Non-Hispanic African American: No     Diabetic: No     Tobacco smoker: No     Systolic Blood Pressure: 086 mmHg     Is BP treated: No     HDL Cholesterol: 64 mg/dL     Total Cholesterol: 210 mg/dL  Start atorvastatin 10 mg AFTER she completes clarithromycin

## 2017-12-14 ENCOUNTER — Telehealth: Payer: Self-pay

## 2017-12-14 NOTE — Telephone Encounter (Signed)
Copied from [place] [id number]. Topic: General - Other >> Dec 14, 2017  1:36 PM Carolyn Stare wrote:  Pt would like a call back she said she is unsure what medicine she should be taking  Pt said she did not pick up the prilosec from Tuesday and would like to know if she should  [phone]

## 2017-12-14 NOTE — Telephone Encounter (Signed)
I talked with patient Don't take the celebrex for two weeks Tylenol okay Do return the stools Check for cure in about 6 weeks Start statin AFTER she finishes the antibiotics Stop the prilosec two weeks prior to test of cure

## 2017-12-14 NOTE — Addendum Note (Signed)
Addended by: Chilton Greathouse on: 12/14/2017 08:56 AM   Modules accepted: Orders

## 2017-12-19 ENCOUNTER — Ambulatory Visit: Payer: PPO

## 2017-12-25 ENCOUNTER — Ambulatory Visit (INDEPENDENT_AMBULATORY_CARE_PROVIDER_SITE_OTHER): Payer: PPO

## 2017-12-25 ENCOUNTER — Ambulatory Visit
Admission: RE | Admit: 2017-12-25 | Discharge: 2017-12-25 | Disposition: A | Discharge status: Home / Self Care | Payer: PPO | Source: Ambulatory Visit | Attending: Family Medicine | Admitting: Family Medicine

## 2017-12-25 DIAGNOSIS — Z1231 Encounter for screening mammogram for malignant neoplasm of breast: Secondary | ICD-10-CM

## 2017-12-25 DIAGNOSIS — K219 Gastro-esophageal reflux disease without esophagitis: Secondary | ICD-10-CM

## 2017-12-25 LAB — POC HEMOCCULT BLD/STL (HOME/3-CARD/SCREEN)
Card #2 Fecal Occult Blod, POC: NEGATIVE
FECAL OCCULT BLD: NEGATIVE
FECAL OCCULT BLD: NEGATIVE

## 2017-12-28 ENCOUNTER — Ambulatory Visit (INDEPENDENT_AMBULATORY_CARE_PROVIDER_SITE_OTHER): Payer: PPO

## 2017-12-28 VITALS — BP 108/70 | HR 80 | Temp 98.1°F | Resp 12 | Ht 64.0 in | Wt 133.3 lb

## 2017-12-28 DIAGNOSIS — Z0000 Encounter for general adult medical examination without abnormal findings: Secondary | ICD-10-CM | POA: Diagnosis not present

## 2017-12-28 NOTE — Progress Notes (Addendum)
Subjective:   [name] is a 72 y.o. female who presents for Medicare Annual (Subsequent) preventive examination.  Review of Systems:  N/A Cardiac Risk Factors include: advanced age (>57men, >69 women);dyslipidemia;hypertension;sedentary lifestyle     Objective:     Vitals: Resp 12   Ht 5\' 4"  (1.626 m)   Wt 133 lb 4.8 oz (60.5 kg)   LMP  (LMP Unknown)   BMI 22.88 kg/m   Body mass index is 22.88 kg/m.  Advanced Directives 12/28/2017 03/09/2017 12/05/2016 04/01/2016 02/15/2016 10/28/2015  Does Patient Have a Medical Advance Directive? No Yes Yes Yes Yes No;Yes  Type of Advance Directive - [place];Living will - Living will;Healthcare Power of Attorney Living will Living will  Copy of [place] in Chart? - No - copy requested - - No - copy requested -  Would patient like information on creating a medical advance directive? Yes (MAU/Ambulatory/Procedural Areas - Information given) - - - - -    Tobacco Social History   Tobacco Use  Smoking Status Never Smoker  Smokeless Tobacco Never Used  Tobacco Comment   smoking cessation materials not required     Counseling given: No Comment: smoking cessation materials not required  Clinical Intake:  Pre-visit preparation completed: Yes  Pain : No/denies pain   BMI - recorded: 23.96 Nutritional Status: BMI of 19-24  Normal Nutritional Risks: None Diabetes: No  How often do you need to have someone help you when you read instructions, pamphlets, or other written materials from your doctor or pharmacy?: 1 - Never  Interpreter Needed?: No  Information entered by :: AEversole, LPN  Past Medical History:  Diagnosis Date  . Arthritis    in back  . Cataract 2011   remove from right eye  . Dyslipidemia, goal LDL below 130 10/28/2015  . Hormone replacement therapy (postmenopausal) 10/28/2015  . Hyperlipidemia   . Hypertension   . Osteopenia   . Patient is full code 12/05/2016  . TMJ  (dislocation of temporomandibular joint)    Past Surgical History:  Procedure Laterality Date  .  MESH SLING X 15 YRS    . ANTERIOR AND POSTERIOR REPAIR    . AUGMENTATION MAMMAPLASTY Bilateral    20 yrs ago , then replaced 5 yrs ago  . BLADDER SUSPENSION    . BREAST ENHANCEMENT SURGERY    . BREAST SURGERY  2012   BILATERAL BREAST IMPLANT REMOVAL  . EYE SURGERY  20114   right eye  . INCONTINENCE SURGERY    . NASAL SINUS SURGERY    . NOSE SURGERY    . VAGINAL HYSTERECTOMY     still has ovaries   Family History  Problem Relation Age of Onset  . Cancer Mother        COLON AND UTERINE  . Cancer Father        PROSTATE  . Diabetes Father   . Hypertension Father   . Stroke Father   . Arthritis Sister   . Diabetes Son   . Breast cancer Neg Hx    Social History   Socioeconomic History  . Marital status: Married    Spouse name: Not on file  . Number of children: Not on file  . Years of education: Not on file  . Highest education level: Not on file  Occupational History  . Occupation: Retired  Scientific laboratory technician  . Financial resource strain: Not hard at all  . Food insecurity:    Worry:  Never true    Inability: Never true  . Transportation needs:    Medical: No    Non-medical: No  Tobacco Use  . Smoking status: Never Smoker  . Smokeless tobacco: Never Used  . Tobacco comment: smoking cessation materials not required  Substance and Sexual Activity  . Alcohol use: No  . Drug use: No  . Sexual activity: Yes    Partners: Male    Birth control/protection: Surgical  Lifestyle  . Physical activity:    Days per week: 0 days    Minutes per session: 0 min  . Stress: Not at all  Relationships  . Social connections:    Talks on phone: Patient refused    Gets together: Patient refused    Attends religious service: Patient refused    Active member of club or organization: Patient refused    Attends meetings of clubs or organizations: Patient refused    Relationship status:  Married  Other Topics Concern  . Not on file  Social History Narrative  . Not on file    Outpatient Encounter Medications as of 12/28/2017  Medication Sig  . Alfalfa 250 MG TABS Take by mouth.  . calcium carbonate (OS-CAL) 600 MG TABS Take 600 mg by mouth as needed.   . celecoxib (CELEBREX) 200 MG capsule Take 200 mg by mouth daily.   . chloroquine (ARALEN) 250 MG tablet Take 2 tablets (500 mg total) by mouth once a week.  . cholecalciferol (VITAMIN D) 1000 UNITS tablet Take 1,000 Units by mouth daily.  Marland Kitchen estrogens-methylTEST (ESTRATEST) 1.25-2.5 MG tablet Take 1 tablet by mouth daily.  . fish oil-omega-3 fatty acids 1000 MG capsule Take 2 g by mouth daily.  Marland Kitchen gabapentin (NEURONTIN) 300 MG capsule Take 1 capsule (300 mg total) by mouth at bedtime.  . Multiple Vitamin (MULTIVITAMIN) capsule Take 1 capsule by mouth daily.  . multivitamin-lutein (OCUVITE-LUTEIN) CAPS Take 1 capsule by mouth daily.  Marland Kitchen omeprazole (PRILOSEC) 20 MG capsule Take 1 capsule (20 mg total) by mouth 2 (two) times daily before a meal.  . tolterodine (DETROL LA) 4 MG 24 hr capsule Take 1 capsule (4 mg total) by mouth daily.  Marland Kitchen triamcinolone cream (KENALOG) 0.5 % Apply 1 application topically 3 (three) times daily. As needed   No facility-administered encounter medications on file as of 12/28/2017.     Activities of Daily Living In your present state of health, do you have any difficulty performing the following activities: 12/28/2017 12/12/2017  Hearing? N N  Comment denies hearing aids -  Vision? N N  Comment wears eyeglasses -  Difficulty concentrating or making decisions? - N  Walking or climbing stairs? - N  Dressing or bathing? N N  Doing errands, shopping? N N  Preparing Food and eating ? N -  Comment denies dentures -  Using the Toilet? N -  Managing your Medications? N -  Managing your Finances? N -  Housekeeping or managing your Housekeeping? N -  Some recent data might be hidden    Patient Care  Team: Lada, Satira Anis, MD as PCP - General (Family Medicine) Anell Barr, OD (Optometry) Dasher, Rayvon Char, MD (Dermatology)    Assessment:   This is a routine wellness examination for [place].  Exercise Activities and Dietary recommendations Current Exercise Habits: The patient does not participate in regular exercise at present, Exercise limited by: None identified  Goals    . DIET - INCREASE WATER INTAKE     Recommend to  drink at least 6-8 8oz glasses of water per day.       Fall Risk Fall Risk  12/28/2017 12/12/2017 12/12/2017 12/05/2016 04/01/2016  Falls in the past year? No No No No No  Risk for fall due to : Impaired vision - - - -  Risk for fall due to: Comment wears eyeglasses - - - -   FALL RISK PREVENTION PERTAINING TO HOME: Is your home free of loose throw rugs in walkways, pet beds, electrical cords, etc? Yes Is there adequate lighting in your home to reduce risk of falls?  Yes Are there stairs in or around your home WITH handrails? Yes  ASSISTIVE DEVICES UTILIZED TO PREVENT FALLS: Use of a cane, walker or w/c? No Grab bars in the bathroom? Yes  Shower chair or a place to sit while bathing? Yes An elevated toilet seat or a handicapped toilet? Yes  Timed Get Up and Go Performed: Yes. Pt ambulated 10 feet within 5 sec. Gait stead-fast and without the use of an assistive device. No intervention required at this time. Fall risk prevention has been discussed.  Community Resource Referral:  [place] Referral not required at this time.   Depression Screen PHQ 2/9 Scores 12/28/2017 12/12/2017 12/05/2016 04/01/2016  PHQ - 2 Score 0 0 0 0  PHQ- 9 Score 0 - - -     Cognitive Function     6CIT Screen 12/28/2017 12/05/2016  What Year? 0 points 0 points  What month? 0 points 0 points  What time? 0 points 0 points  Count back from 20 0 points 0 points  Months in reverse 0 points 0 points  Repeat phrase 0 points 0 points  Total Score 0 0    Immunization  History  Administered Date(s) Administered  . Influenza, High Dose Seasonal PF 02/15/2016  . Influenza,inj,quad, With Preservative 03/10/2017  . Influenza-Unspecified 03/02/2015, 02/24/2017, 03/07/2017  . Pneumococcal Conjugate-13 10/28/2015  . Pneumococcal Polysaccharide-23 11/05/2012  . Td 03/11/2013  . Zoster 05/26/2006  . Zoster Recombinat (Shingrix) 12/06/2016, 02/24/2017    Qualifies for Shingles Vaccine? No  Screening Tests Health Maintenance  Topic Date Due  . INFLUENZA VACCINE  12/14/2017  . DEXA SCAN  04/11/2018  . COLONOSCOPY  11/25/2018  . MAMMOGRAM  12/26/2018  . TETANUS/TDAP  03/12/2023  . Hepatitis C Screening  Completed  . PNA vac Low Risk Adult  Completed    Cancer Screenings: Lung: Low Dose CT Chest recommended if Age 28-80 years, 30 pack-year currently smoking OR have quit w/in 15years. Patient does not qualify Breast:  Up to date on Mammogram? Yes. Completed 12/25/17. Repeat every year   Up to date of Bone Density/Dexa? Yes. Completed 04/11/16. Repeat every 2 years. Scheduled 04/18/18 Colorectal: Completed 12/13/13. Repeat every 5 years  Additional Screenings: Hepatitis C Screening: Completed 10/29/15    Plan:  I have personally reviewed and addressed the Medicare Annual Wellness questionnaire and have noted the following in the patient's chart:  A. Medical and social history B. Use of alcohol, tobacco or illicit drugs  C. Current medications and supplements D. Functional ability and status E.  Nutritional status F.  Physical activity G. Advance directives H. List of other physicians I.  Hospitalizations, surgeries, and ER visits in previous 12 months J.  [place] such as hearing and vision if needed, cognitive and depression L. Referrals and appointments  In addition, I have reviewed and discussed with patient certain preventive protocols, quality metrics, and best practice recommendations. A written  personalized care plan for preventive  services as well as general preventive health recommendations were provided to patient.  See attached scanned questionnaire for additional information.   Signed,  Aleatha Borer, LPN Nurse Health Advisor  ---------------------------------- Vitals:   12/28/17 1305  BP: 108/70  Pulse: 80  Resp: 12  Temp: 98.1 F (36.7 C)  SpO2: 96%

## 2017-12-28 NOTE — Patient Instructions (Addendum)
[name] , Thank you for taking time to come for your Medicare Wellness Visit. I appreciate your ongoing commitment to your health goals. Please review the following plan we discussed and let me know if I can assist you in the future.    Screening recommendations/referrals: Colorectal Screening: Up to date Mammogram: Up to date Bone Density: Please keep your appointment as scheduled  Vision and Dental Exams: Recommended annual ophthalmology exams for early detection of glaucoma and other disorders of the eye Recommended annual dental exams for proper oral hygiene  Vaccinations: Influenza vaccine: Up to date Pneumococcal vaccine: Up to date Tdap vaccine: Up to date Shingles vaccine: Up to date    Advanced directives: Please bring a copy of your POA (Power of [place]) and/or Living Will to your next appointment.  Goals: Recommend to drink at least 6-8 8oz glasses of water per day.  Next appointment: Please schedule your Annual Wellness Visit with your Nurse Health Advisor in one year.  Preventive Care 72 Years and Older, Female Preventive care refers to lifestyle choices and visits with your health care provider that can promote health and wellness. What does preventive care include?  A yearly physical exam. This is also called an annual well check.  Dental exams once or twice a year.  Routine eye exams. Ask your health care provider how often you should have your eyes checked.  Personal lifestyle choices, including:  Daily care of your teeth and gums.  Regular physical activity.  Eating a healthy diet.  Avoiding tobacco and drug use.  Limiting alcohol use.  Practicing safe sex.  Taking low-dose aspirin every day.  Taking vitamin and mineral supplements as recommended by your health care provider. What happens during an annual well check? The services and screenings done by your health care provider during your annual well check will depend on your age, overall  health, lifestyle risk factors, and family history of disease. Counseling  Your health care provider may ask you questions about your:  Alcohol use.  Tobacco use.  Drug use.  Emotional well-being.  Home and relationship well-being.  Sexual activity.  Eating habits.  History of falls.  Memory and ability to understand (cognition).  Work and work Statistician.  Reproductive health. Screening  You may have the following tests or measurements:  Height, weight, and BMI.  Blood pressure.  Lipid and cholesterol levels. These may be checked every 5 years, or more frequently if you are over 55 years old.  Skin check.  Lung cancer screening. You may have this screening every year starting at age 72 if you have a 30-pack-year history of smoking and currently smoke or have quit within the past 15 years.  Fecal occult blood test (FOBT) of the stool. You may have this test every year starting at age 72.  Flexible sigmoidoscopy or colonoscopy. You may have a sigmoidoscopy every 5 years or a colonoscopy every 10 years starting at age 72.  Hepatitis C blood test.  Hepatitis B blood test.  Sexually transmitted disease (STD) testing.  Diabetes screening. This is done by checking your blood sugar (glucose) after you have not eaten for a while (fasting). You may have this done every 1-3 years.  Bone density scan. This is done to screen for osteoporosis. You may have this done starting at age 72.  Mammogram. This may be done every 1-2 years. Talk to your health care provider about how often you should have regular mammograms. Talk with your health care provider about  your test results, treatment options, and if necessary, the need for more tests. Vaccines  Your health care provider may recommend certain vaccines, such as:  Influenza vaccine. This is recommended every year.  Tetanus, diphtheria, and acellular pertussis (Tdap, Td) vaccine. You may need a Td booster every 10  years.  Zoster vaccine. You may need this after age 72.  Pneumococcal 13-valent conjugate (PCV13) vaccine. One dose is recommended after age 72.  Pneumococcal polysaccharide (PPSV23) vaccine. One dose is recommended after age 72. Talk to your health care provider about which screenings and vaccines you need and how often you need them. This information is not intended to replace advice given to you by your health care provider. Make sure you discuss any questions you have with your health care provider. Document Released: 05/29/2015 Document Revised: 01/20/2016 Document Reviewed: 03/03/2015 Elsevier Interactive Patient Education  2017 [place] Prevention in the Home Falls can cause injuries. They can happen to people of all ages. There are many things you can do to make your home safe and to help prevent falls. What can I do on the outside of my home?  Regularly fix the edges of walkways and driveways and fix any cracks.  Remove anything that might make you trip as you walk through a door, such as a raised step or threshold.  Trim any bushes or trees on the path to your home.  Use bright outdoor lighting.  Clear any walking paths of anything that might make someone trip, such as rocks or tools.  Regularly check to see if handrails are loose or broken. Make sure that both sides of any steps have handrails.  Any raised decks and porches should have guardrails on the edges.  Have any leaves, snow, or ice cleared regularly.  Use sand or salt on walking paths during winter.  Clean up any spills in your garage right away. This includes oil or grease spills. What can I do in the bathroom?  Use night lights.  Install grab bars by the toilet and in the tub and shower. Do not use towel bars as grab bars.  Use non-skid mats or decals in the tub or shower.  If you need to sit down in the shower, use a plastic, non-slip stool.  Keep the floor dry. Clean up any water that  spills on the floor as soon as it happens.  Remove soap buildup in the tub or shower regularly.  Attach bath mats securely with double-sided non-slip rug tape.  Do not have throw rugs and other things on the floor that can make you trip. What can I do in the bedroom?  Use night lights.  Make sure that you have a light by your bed that is easy to reach.  Do not use any sheets or blankets that are too big for your bed. They should not hang down onto the floor.  Have a firm chair that has side arms. You can use this for support while you get dressed.  Do not have throw rugs and other things on the floor that can make you trip. What can I do in the kitchen?  Clean up any spills right away.  Avoid walking on wet floors.  Keep items that you use a lot in easy-to-reach places.  If you need to reach something above you, use a strong step stool that has a grab bar.  Keep electrical cords out of the way.  Do not use floor polish or wax  that makes floors slippery. If you must use wax, use non-skid floor wax.  Do not have throw rugs and other things on the floor that can make you trip. What can I do with my stairs?  Do not leave any items on the stairs.  Make sure that there are handrails on both sides of the stairs and use them. Fix handrails that are broken or loose. Make sure that handrails are as long as the stairways.  Check any carpeting to make sure that it is firmly attached to the stairs. Fix any carpet that is loose or worn.  Avoid having throw rugs at the top or bottom of the stairs. If you do have throw rugs, attach them to the floor with carpet tape.  Make sure that you have a light switch at the top of the stairs and the bottom of the stairs. If you do not have them, ask someone to add them for you. What else can I do to help prevent falls?  Wear shoes that:  Do not have high heels.  Have rubber bottoms.  Are comfortable and fit you well.  Are closed at the  toe. Do not wear sandals.  If you use a stepladder:  Make sure that it is fully opened. Do not climb a closed stepladder.  Make sure that both sides of the stepladder are locked into place.  Ask someone to hold it for you, if possible.  Clearly mark and make sure that you can see:  Any grab bars or handrails.  First and last steps.  Where the edge of each step is.  Use tools that help you move around (mobility aids) if they are needed. These include:  Canes.  Walkers.  Scooters.  Crutches.  Turn on the lights when you go into a dark area. Replace any light bulbs as soon as they burn out.  Set up your furniture so you have a clear path. Avoid moving your furniture around.  If any of your floors are uneven, fix them.  If there are any pets around you, be aware of where they are.  Review your medicines with your doctor. Some medicines can make you feel dizzy. This can increase your chance of falling. Ask your doctor what other things that you can do to help prevent falls. This information is not intended to replace advice given to you by your health care provider. Make sure you discuss any questions you have with your health care provider. Document Released: 02/26/2009 Document Revised: 10/08/2015 Document Reviewed: 06/06/2014 Elsevier Interactive Patient Education  2017 [employer].

## 2017-12-29 ENCOUNTER — Telehealth: Payer: Self-pay | Admitting: Family Medicine

## 2017-12-29 MED ORDER — ATORVASTATIN CALCIUM 10 MG PO TABS
10.0000 mg | ORAL_TABLET | Freq: Every day | ORAL | 1 refills | Status: DC
Start: 2017-12-29 — End: 2018-02-07

## 2017-12-29 NOTE — Telephone Encounter (Signed)
Okay to start statin now

## 2018-01-02 ENCOUNTER — Encounter: Payer: Self-pay | Admitting: Family Medicine

## 2018-01-02 ENCOUNTER — Other Ambulatory Visit (HOSPITAL_COMMUNITY)
Admission: RE | Admit: 2018-01-02 | Discharge: 2018-01-02 | Disposition: A | Discharge status: Home / Self Care | Payer: PPO | Source: Ambulatory Visit | Attending: Family Medicine | Admitting: Family Medicine

## 2018-01-02 ENCOUNTER — Ambulatory Visit (INDEPENDENT_AMBULATORY_CARE_PROVIDER_SITE_OTHER): Payer: PPO | Admitting: Family Medicine

## 2018-01-02 VITALS — BP 120/80 | HR 84 | Temp 98.5°F | Resp 16 | Ht 64.0 in | Wt 129.0 lb

## 2018-01-02 DIAGNOSIS — A6004 Herpesviral vulvovaginitis: Secondary | ICD-10-CM

## 2018-01-02 DIAGNOSIS — B373 Candidiasis of vulva and vagina: Secondary | ICD-10-CM

## 2018-01-02 DIAGNOSIS — N760 Acute vaginitis: Secondary | ICD-10-CM | POA: Diagnosis not present

## 2018-01-02 DIAGNOSIS — B3731 Acute candidiasis of vulva and vagina: Secondary | ICD-10-CM

## 2018-01-02 MED ORDER — FLUCONAZOLE 150 MG PO TABS: 150.0000 mg | ORAL_TABLET | Freq: Once | ORAL | 0 refills | Status: AC

## 2018-01-02 MED ORDER — VALACYCLOVIR HCL 500 MG PO TABS: 500.0000 mg | ORAL_TABLET | Freq: Two times a day (BID) | ORAL | 0 refills | Status: DC

## 2018-01-02 NOTE — Progress Notes (Signed)
Name: [name]   MRN: [id number]    DOB: September 05, 1945   Date:01/02/2018       Progress Note  Subjective  Chief Complaint  Chief Complaint  Patient presents with  . Vaginal Itching    has been an antibiotics for 2 weeks    HPI  Pt presents with concern for vaginal irritation.  She notes she was on antibiotics for 2 weeks - completed this about 2-3 days ago.  She has history of genital herpes and had a lesion appear about 1 week ago.  She applied herpicin to the lesion, but it has just become more inflamed and itchy and painful.  She does have a history of yeast infections many years ago.  Denies fevers/chills, abdominal pain, denies concern for STI's.    Patient Active Problem List   Diagnosis Date Noted  . Osteopenia 12/12/2017  . Hormone replacement therapy (HRT) 12/12/2017  . Patient is full code 12/05/2016  . Insomnia 04/10/2016  . Hypertensive retinopathy 02/15/2016  . Urine incontinence 02/15/2016  . Breast cancer screening 10/28/2015  . Medication monitoring encounter 10/28/2015  . Dyslipidemia, goal LDL below 100 10/28/2015  . Hormone replacement therapy (postmenopausal) 10/28/2015  . Preventative health care 10/28/2015  . Post-menopausal 10/07/2015  . Diarrhea, travelers' 09/08/2015  . 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 Dermatitis 05/15/2015    Social History   Tobacco Use  . Smoking status: Never Smoker  . Smokeless tobacco: Never Used  . Tobacco comment: smoking cessation materials not required  Substance Use Topics  . Alcohol use: No     Current Outpatient Medications:  .  Alfalfa 250 MG TABS, Take by mouth., Disp: , Rfl:  .  atorvastatin (LIPITOR) 10 MG tablet, Take 1 tablet (10 mg total) by mouth at bedtime., Disp: 30 tablet, Rfl: 1 .  calcium carbonate (OS-CAL) 600 MG TABS, Take 600 mg by mouth as needed. , Disp: , Rfl:  .  celecoxib (CELEBREX) 200 MG capsule, Take 200 mg by mouth daily. , Disp: , Rfl:  .  chloroquine (ARALEN) 250 MG tablet,  Take 2 tablets (500 mg total) by mouth once a week., Disp: 30 tablet, Rfl: 0 .  cholecalciferol (VITAMIN D) 1000 UNITS tablet, Take 1,000 Units by mouth daily., Disp: , Rfl:  .  estrogens-methylTEST (ESTRATEST) 1.25-2.5 MG tablet, Take 1 tablet by mouth daily., Disp: 90 tablet, Rfl: 1 .  fish oil-omega-3 fatty acids 1000 MG capsule, Take 2 g by mouth daily., Disp: , Rfl:  .  gabapentin (NEURONTIN) 300 MG capsule, Take 1 capsule (300 mg total) by mouth at bedtime., Disp: , Rfl:  .  Multiple Vitamin (MULTIVITAMIN) capsule, Take 1 capsule by mouth daily., Disp: , Rfl:  .  multivitamin-lutein (OCUVITE-LUTEIN) CAPS, Take 1 capsule by mouth daily., Disp: , Rfl:  .  omeprazole (PRILOSEC) 20 MG capsule, Take 1 capsule (20 mg total) by mouth 2 (two) times daily before a meal., Disp: 60 capsule, Rfl: 0 .  tolterodine (DETROL LA) 4 MG 24 hr capsule, Take 1 capsule (4 mg total) by mouth daily., Disp: 90 capsule, Rfl: 0 .  triamcinolone cream (KENALOG) 0.5 %, Apply 1 application topically 3 (three) times daily. As needed, Disp: 30 g, Rfl: 0  Allergies  Allergen Reactions  . Tizanidine Other (See Comments)    Unable to sleep.    ROS  Ten systems reviewed and is negative except as mentioned in HPI  Objective  Vitals:   01/02/18 0909  BP: 120/80  Pulse:  84  Resp: 16  Temp: 98.5 F (36.9 C)  TempSrc: Oral  SpO2: 96%  Weight: 129 lb (58.5 kg)  Height: 5\' 4"  (1.626 m)   Body mass index is 22.14 kg/m.  Nursing Note and Vital Signs reviewed.  Physical Exam  Constitutional: Patient appears well-developed and well-nourished. No distress.  HENT: Head: Normocephalic and atraumatic. Neck: Normal range of motion. Neck supple. No JVD present. No thyromegaly present.  Cardiovascular: Normal rate, regular rhythm and normal heart sounds.  No murmur heard. No BLE edema. Pulmonary/Chest: Effort normal and breath sounds normal. No respiratory distress. FEMALE GENITALIA:  External genitalia are  erythematous without lesion or discharge.  External urethra normal RECTAL: no external lesions or hemorrhoids Musculoskeletal: Normal range of motion, no joint effusions. No gross deformities Neurological: she is alert and oriented to person, place, and time. No cranial nerve deficit. Coordination, balance, strength, speech and gait are normal.  Skin: Skin is warm and dry. No rash noted.  Psychiatric: Patient has a normal mood and affect. behavior is normal. Judgment and thought content normal.  No results found for this or any previous visit (from the past 72 hour(s)).  Assessment & Plan  1. Herpes simplex vulvovaginitis - valACYclovir (VALTREX) 500 MG tablet; Take 1 tablet (500 mg total) by mouth 2 (two) times daily. For three days in a row during outbreaks.  Dispense: 20 tablet; Refill: 0  2. Candida vaginitis - fluconazole (DIFLUCAN) 150 MG tablet; Take 1 tablet (150 mg total) by mouth once for 1 dose.  Dispense: 1 tablet; Refill: 0  3. Acute vaginitis - fluconazole (DIFLUCAN) 150 MG tablet; Take 1 tablet (150 mg total) by mouth once for 1 dose.  Dispense: 1 tablet; Refill: 0 - Cervicovaginal ancillary only

## 2018-01-03 LAB — CERVICOVAGINAL ANCILLARY ONLY
Bacterial vaginitis: NEGATIVE
CANDIDA VAGINITIS: POSITIVE — AB
Trichomonas: NEGATIVE

## 2018-01-04 ENCOUNTER — Telehealth: Payer: Self-pay | Admitting: Family Medicine

## 2018-01-04 NOTE — Telephone Encounter (Signed)
*  Patient notified of her lab results- she does report she still has some redness - but it has calmed a little- she will call tomorrow if she feels she needs a refill of the Diflucan.  Result: Vaginal swab is positive for yeast infection - if she took the Diflucan and is not better after 72 hours, she needs to let us know and we can repeat the dose. I do want her to keep the valacyclovir (Valtrex) on hand to take if/when she has a herpes outbreak  (Results not in triage result basket)

## 2018-01-05 ENCOUNTER — Telehealth: Payer: Self-pay | Admitting: Family Medicine

## 2018-01-05 DIAGNOSIS — B373 Candidiasis of vulva and vagina: Secondary | ICD-10-CM

## 2018-01-05 DIAGNOSIS — B3731 Acute candidiasis of vulva and vagina: Secondary | ICD-10-CM

## 2018-01-05 MED ORDER — FLUCONAZOLE 150 MG PO TABS: 150.0000 mg | ORAL_TABLET | Freq: Once | ORAL | 0 refills | Status: AC

## 2018-01-05 NOTE — Telephone Encounter (Signed)
Patient notified

## 2018-01-05 NOTE — Telephone Encounter (Signed)
Sent diflucan to [facility].  If she continues to have symptoms over the next 3 days, she will need to come back in for re-evaluation or to go see GYN as Dr. Enzo Bi has treated her in the past for persistent candida.

## 2018-01-05 NOTE — Telephone Encounter (Signed)
Documentation reviewed 

## 2018-01-05 NOTE — Telephone Encounter (Unsigned)
Copied from [place] [id number]. Topic: Quick Communication - See Telephone Encounter >> Jan 05, 2018  9:50 AM Neva Seat wrote: Pt needing the pill to help her yeast infection.  Pt was told if she's not better to get a refill on the medication. Please call into:  [facility] - [place], [place] - [address] [place] [address] Phone: [phone] Fax: [phone]

## 2018-01-29 ENCOUNTER — Encounter: Payer: Self-pay | Admitting: Nurse Practitioner

## 2018-01-29 ENCOUNTER — Ambulatory Visit (INDEPENDENT_AMBULATORY_CARE_PROVIDER_SITE_OTHER): Payer: PPO | Admitting: Nurse Practitioner

## 2018-01-29 VITALS — BP 104/66 | HR 91 | Temp 98.6°F | Resp 16 | Ht 64.0 in | Wt 133.2 lb

## 2018-01-29 DIAGNOSIS — G44209 Tension-type headache, unspecified, not intractable: Secondary | ICD-10-CM

## 2018-01-29 DIAGNOSIS — R509 Fever, unspecified: Secondary | ICD-10-CM

## 2018-01-29 NOTE — Patient Instructions (Addendum)
-   Please drink plenty of water; at least 64 ounces a day  - Please alternate between acetaminophen (no more than 3,000mg  a day) and ibuprofen (no more than 2,400 mg a day) to help with headache and keep fever down  - eat nutritious foods with good protein.  - wash your hands frequently   Fever, Adult A fever is an increase in the body's temperature. It is often defined as a temperature of 100 F (38C) or higher. Short mild or moderate fevers often have no long-term effects. They also often do not need treatment. Moderate or high fevers may make you feel uncomfortable. Sometimes, they can also be a sign of a serious illness or disease. The sweating that may happen with repeated fevers or fevers that last a while may also cause you to not have enough fluid in your body (dehydration). You can take your temperature with a thermometer to see if you have a fever. A measured temperature can change with:  Age.  Time of day.  Where the thermometer is placed: ? Mouth (oral). ? Rectum (rectal). ? Ear (tympanic). ? Underarm (axillary). ? Forehead (temporal).  Follow these instructions at home: Pay attention to any changes in your symptoms. Take these actions to help with your condition:  Take over-the-counter and prescription medicines only as told by your doctor. Follow the dosing instructions carefully.  If you were prescribed an antibiotic medicine, take it as told by your doctor. Do not stop taking the antibiotic even if you start to feel better.  Rest as needed.  Drink enough fluid to keep your pee (urine) clear or pale yellow.  Sponge yourself or bathe with room-temperature water as needed. This helps to lower your body temperature . Do not use ice water.  Do not wear too many blankets or heavy clothes.  Contact a doctor if:  You throw up (vomit).  You cannot eat or drink without throwing up.  You have watery poop (diarrhea).  It hurts when you pee.  Your symptoms do not get  better with treatment.  You have new symptoms.  You feel very weak. Get help right away if:  You are short of breath or have trouble breathing.  Neck stiffness,   You are dizzy or you pass out (faint).  You feel confused.  You have signs of not having enough fluid in your body, such as: ? A dry mouth. ? Peeing less. ? Looking pale.  You have very bad pain in your belly (abdomen).  You keep throwing up or having water poop.  You have a skin rash.  Your symptoms suddenly get worse. This information is not intended to replace advice given to you by your health care provider. Make sure you discuss any questions you have with your health care provider. Document Released: 02/09/2008 Document Revised: 10/08/2015 Document Reviewed: 06/26/2014 Elsevier Interactive Patient Education  [employer].

## 2018-01-29 NOTE — Progress Notes (Signed)
Name: [name]   MRN: [id number]    DOB: [birth year]   Date:01/29/2018       Progress Note  Subjective  Chief Complaint  Chief Complaint  Patient presents with  . Fever  . Headache    HPI  Started Saturday at 6pm noticed she was having chills and noted fever of 102 on Saturday states started getting frontal headache Sunday afternoon last till the evening and resolved with advil. States woke up and still had headache but controlled with advil.  States today is feeling much better today but still doesn't feel good.  Decreased appetite.   Denies photosensitivity, nausea, vomiting, body aches, vision changes, nasal congestion, sore throat, ear pain or ringing in ears. No recent travels outside of the country, no tick bites, no rashes.   Patient Active Problem List   Diagnosis Date Noted  . Osteopenia 12/12/2017  . Hormone replacement therapy (HRT) 12/12/2017  . Patient is full code 12/05/2016  . Insomnia 04/10/2016  . Hypertensive retinopathy 02/15/2016  . Urine incontinence 02/15/2016  . Breast cancer screening 10/28/2015  . Medication monitoring encounter 10/28/2015  . Dyslipidemia, goal LDL below 100 10/28/2015  . Hormone replacement therapy (postmenopausal) 10/28/2015  . Preventative health care 10/28/2015  . Post-menopausal 10/07/2015  . Diarrhea, travelers' 09/08/2015  . 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 Dermatitis 05/15/2015    Past Medical History:  Diagnosis Date  . Arthritis    in back  . Cataract 2011   remove from right eye  . Dyslipidemia, goal LDL below 130 10/28/2015  . Hormone replacement therapy (postmenopausal) 10/28/2015  . Hyperlipidemia   . Hypertension   . Osteopenia   . Patient is full code 12/05/2016  . TMJ (dislocation of temporomandibular joint)     Past Surgical History:  Procedure Laterality Date  .  MESH SLING X 15 YRS    . ANTERIOR AND POSTERIOR REPAIR    . AUGMENTATION MAMMAPLASTY Bilateral    20 yrs ago , then replaced 5 yrs  ago  . BLADDER SUSPENSION    . BREAST ENHANCEMENT SURGERY    . BREAST SURGERY  2012   BILATERAL BREAST IMPLANT REMOVAL  . EYE SURGERY  20114   right eye  . INCONTINENCE SURGERY    . NASAL SINUS SURGERY    . NOSE SURGERY    . VAGINAL HYSTERECTOMY     still has ovaries    Social History   Tobacco Use  . Smoking status: Never Smoker  . Smokeless tobacco: Never Used  . Tobacco comment: smoking cessation materials not required  Substance Use Topics  . Alcohol use: No     Current Outpatient Medications:  .  Alfalfa 250 MG TABS, Take by mouth., Disp: , Rfl:  .  atorvastatin (LIPITOR) 10 MG tablet, Take 1 tablet (10 mg total) by mouth at bedtime., Disp: 30 tablet, Rfl: 1 .  calcium carbonate (OS-CAL) 600 MG TABS, Take 600 mg by mouth as needed. , Disp: , Rfl:  .  celecoxib (CELEBREX) 200 MG capsule, Take 200 mg by mouth daily. , Disp: , Rfl:  .  chloroquine (ARALEN) 250 MG tablet, Take 2 tablets (500 mg total) by mouth once a week., Disp: 30 tablet, Rfl: 0 .  cholecalciferol (VITAMIN D) 1000 UNITS tablet, Take 1,000 Units by mouth daily., Disp: , Rfl:  .  estrogens-methylTEST (ESTRATEST) 1.25-2.5 MG tablet, Take 1 tablet by mouth daily., Disp: 90 tablet, Rfl: 1 .  fish oil-omega-3 fatty acids 1000 MG capsule, Take  2 g by mouth daily., Disp: , Rfl:  .  gabapentin (NEURONTIN) 300 MG capsule, Take 1 capsule (300 mg total) by mouth at bedtime., Disp: , Rfl:  .  Multiple Vitamin (MULTIVITAMIN) capsule, Take 1 capsule by mouth daily., Disp: , Rfl:  .  omeprazole (PRILOSEC) 20 MG capsule, Take 1 capsule (20 mg total) by mouth 2 (two) times daily before a meal., Disp: 60 capsule, Rfl: 0 .  tolterodine (DETROL LA) 4 MG 24 hr capsule, Take 1 capsule (4 mg total) by mouth daily., Disp: 90 capsule, Rfl: 0 .  triamcinolone cream (KENALOG) 0.5 %, Apply 1 application topically 3 (three) times daily. As needed, Disp: 30 g, Rfl: 0 .  valACYclovir (VALTREX) 500 MG tablet, Take 1 tablet (500 mg total) by  mouth 2 (two) times daily. For three days in a row during outbreaks., Disp: 20 tablet, Rfl: 0 .  multivitamin-lutein (OCUVITE-LUTEIN) CAPS, Take 1 capsule by mouth daily., Disp: , Rfl:   Allergies  Allergen Reactions  . Tizanidine Other (See Comments)    Unable to sleep.    ROS   No other specific complaints in a complete review of systems (except as listed in HPI above).  Objective  Vitals:   01/29/18 0908 01/29/18 0921  BP: 104/66   Pulse: (!) 105 91  Resp: 16   Temp: 98.6 F (37 C)   TempSrc: Oral   SpO2: 98%   Weight: 133 lb 3.2 oz (60.4 kg)   Height: 5\' 4"  (1.626 m)     Body mass index is 22.86 kg/m.  Nursing Note and Vital Signs reviewed.  Physical Exam  Constitutional: She is oriented to person, place, and time. She appears well-developed and well-nourished. She is cooperative.  HENT:  Head: Normocephalic.  Right Ear: Hearing and external ear normal. Right ear drainage: cerumen impacted.  Left Ear: Hearing and external ear normal. Left ear drainage: cerumen impacted.  Nose: Nose normal.  Mouth/Throat: Uvula is midline, oropharynx is clear and moist and mucous membranes are normal.  Eyes: Pupils are equal, round, and reactive to light. EOM are normal.  Cardiovascular: Normal rate, regular rhythm and normal heart sounds.  No lower extremity edema noted.  Pulmonary/Chest: Effort normal and breath sounds normal.  Abdominal: Soft. Normal appearance and bowel sounds are normal. There is no tenderness.  Musculoskeletal: Normal range of motion. She exhibits no edema or tenderness.  Lymphadenopathy:    She has no cervical adenopathy.  Neurological: She is alert and oriented to person, place, and time. She has normal strength.  Skin: Skin is warm, dry and intact. No rash noted. She is not diaphoretic.  Psychiatric: She has a normal mood and affect. Her speech is normal and behavior is normal. Thought content normal.       No results found for this or any  previous visit (from the past 48 hour(s)).  Assessment & Plan 1. Fever, unspecified fever cause  - CBC with Differential - COMPLETE METABOLIC PANEL WITH GFR  2. Acute non intractable tension-type headache Relief with NSAID   - Please drink plenty of water; at least 64 ounces a day  - Please alternate between acetaminophen (no more than 3,000mg  a day) and ibuprofen (no more than 2,400 mg a day) to help with headache and keep fever down  - eat nutritious foods with good protein.  - wash your hands frequently  - Discussed ER precautions

## 2018-01-30 LAB — COMPLETE METABOLIC PANEL WITH GFR
AG RATIO: 1.6 (calc) (ref 1.0–2.5)
ALT: 15 U/L (ref 6–29)
AST: 22 U/L (ref 10–35)
Albumin: 3.8 g/dL (ref 3.6–5.1)
Alkaline phosphatase (APISO): 45 U/L (ref 33–130)
BUN/Creatinine Ratio: 28 (calc) — ABNORMAL HIGH (ref 6–22)
BUN: 27 mg/dL — ABNORMAL HIGH (ref 7–25)
CALCIUM: 9.2 mg/dL (ref 8.6–10.4)
CO2: 28 mmol/L (ref 20–32)
Chloride: 101 mmol/L (ref 98–110)
Creat: 0.95 mg/dL — ABNORMAL HIGH (ref 0.60–0.93)
GFR, EST AFRICAN AMERICAN: 70 mL/min/{1.73_m2} (ref >=60)
GFR, EST NON AFRICAN AMERICAN: 60 mL/min/{1.73_m2} (ref >=60)
GLOBULIN: 2.4 g/dL (ref 1.9–3.7)
Glucose, Bld: 83 mg/dL (ref 65–99)
POTASSIUM: 4.2 mmol/L (ref 3.5–5.3)
SODIUM: 138 mmol/L (ref 135–146)
TOTAL PROTEIN: 6.2 g/dL (ref 6.1–8.1)
Total Bilirubin: 0.4 mg/dL (ref 0.2–1.2)

## 2018-01-30 LAB — CBC WITH DIFFERENTIAL/PLATELET
BASOS PCT: 0.4 %
Basophils Absolute: 50 cells/uL (ref 0–200)
EOS PCT: 0.2 %
Eosinophils Absolute: 25 cells/uL (ref 15–500)
HCT: 41.4 % (ref 35.0–45.0)
Hemoglobin: 14.3 g/dL (ref 11.7–15.5)
Lymphs Abs: 970 cells/uL (ref 850–3900)
MCH: 32 pg (ref 27.0–33.0)
MCHC: 34.5 g/dL (ref 32.0–36.0)
MCV: 92.6 fL (ref 80.0–100.0)
MPV: 11 fL (ref 7.5–12.5)
Monocytes Relative: 12.3 %
Neutro Abs: 10004 cells/uL — ABNORMAL HIGH (ref 1500–7800)
Neutrophils Relative %: 79.4 %
PLATELETS: 182 10*3/uL (ref 140–400)
RBC: 4.47 10*6/uL (ref 3.80–5.10)
RDW: 12.1 % (ref 11.0–15.0)
TOTAL LYMPHOCYTE: 7.7 %
WBC: 12.6 10*3/uL — AB (ref 3.8–10.8)
WBCMIX: 1550 {cells}/uL — AB (ref 200–950)

## 2018-01-31 ENCOUNTER — Ambulatory Visit: Payer: Self-pay | Admitting: *Deleted

## 2018-01-31 DIAGNOSIS — Z79899 Other long term (current) drug therapy: Secondary | ICD-10-CM | POA: Diagnosis not present

## 2018-01-31 DIAGNOSIS — N390 Urinary tract infection, site not specified: Secondary | ICD-10-CM | POA: Insufficient documentation

## 2018-01-31 DIAGNOSIS — E860 Dehydration: Secondary | ICD-10-CM | POA: Diagnosis not present

## 2018-01-31 DIAGNOSIS — D72829 Elevated white blood cell count, unspecified: Secondary | ICD-10-CM | POA: Insufficient documentation

## 2018-01-31 DIAGNOSIS — Z0000 Encounter for general adult medical examination without abnormal findings: Secondary | ICD-10-CM | POA: Diagnosis not present

## 2018-01-31 DIAGNOSIS — B9620 Unspecified Escherichia coli [E. coli] as the cause of diseases classified elsewhere: Secondary | ICD-10-CM | POA: Diagnosis not present

## 2018-01-31 DIAGNOSIS — N289 Disorder of kidney and ureter, unspecified: Secondary | ICD-10-CM | POA: Diagnosis not present

## 2018-01-31 DIAGNOSIS — R531 Weakness: Secondary | ICD-10-CM | POA: Diagnosis not present

## 2018-01-31 NOTE — Telephone Encounter (Signed)
Pt called with having a fever still since last Saturday. Her temp is normally 97. But has been up to 102. She is taking tylenol and ibuprofen for the fever. Advised not to take unless she has an elevation. Last temp this morning was 99 and she took med for it. She feels like her output has decreased. She is drinking and going to the bathroom but only going small amounts. She is also having some dizziness at times.  She is out of town right now and wanted her provider to know what is going on. She said that she was told that her white count was elevated and it showed possibly an infection and also dehydration showed in her blood work done at the office.   Notified flow at [facility] and spoke with Dr. Sanda Klein. Advised pt to go to an urgent care for labs and possibly CXR. Pt is going to [facility] in [place], [place].   Reason for Disposition . Fever present > 3 days (72 hours)  Answer Assessment - Initial Assessment Questions 1. TEMPERATURE: "What is the most recent temperature?"  "How was it measured?"      99 2. ONSET: "When did the fever start?"    01/27/18 3. SYMPTOMS: "Do you have any other symptoms besides the fever?"  (e.g., colds, headache, sore throat, earache, cough, rash, diarrhea, vomiting, abdominal pain)   Headache, decrease in urine output 4. CAUSE: If there are no symptoms, ask: "What do you think is causing the fever?"      Not sure 5. CONTACTS: "Does anyone else in the family have an infection?"   no 6. TREATMENT: "What have you done so far to treat this fever?" (e.g., medications)     Tylenol and ibuprofen 7. IMMUNOCOMPROMISE: "Do you have of the following: diabetes, HIV positive, splenectomy, cancer chemotherapy, chronic steroid treatment, transplant patient, etc."   no 8. PREGNANCY: "Is there any chance you are pregnant?" "When was your last menstrual period?"     n/a 9. TRAVEL: "Have you traveled out of the country in the last month?"  (e.g., travel history, exposures)  Protocols used: FEVER-A-AH

## 2018-02-01 DIAGNOSIS — D72829 Elevated white blood cell count, unspecified: Secondary | ICD-10-CM | POA: Diagnosis not present

## 2018-02-01 DIAGNOSIS — B9689 Other specified bacterial agents as the cause of diseases classified elsewhere: Secondary | ICD-10-CM | POA: Diagnosis not present

## 2018-02-01 DIAGNOSIS — E876 Hypokalemia: Secondary | ICD-10-CM | POA: Insufficient documentation

## 2018-02-01 DIAGNOSIS — N19 Unspecified kidney failure: Secondary | ICD-10-CM | POA: Insufficient documentation

## 2018-02-01 DIAGNOSIS — N390 Urinary tract infection, site not specified: Secondary | ICD-10-CM | POA: Diagnosis not present

## 2018-02-01 DIAGNOSIS — E860 Dehydration: Secondary | ICD-10-CM | POA: Insufficient documentation

## 2018-02-01 DIAGNOSIS — Z0000 Encounter for general adult medical examination without abnormal findings: Secondary | ICD-10-CM | POA: Diagnosis not present

## 2018-02-01 DIAGNOSIS — A4151 Sepsis due to Escherichia coli [E. coli]: Secondary | ICD-10-CM | POA: Diagnosis not present

## 2018-02-02 DIAGNOSIS — N289 Disorder of kidney and ureter, unspecified: Secondary | ICD-10-CM | POA: Insufficient documentation

## 2018-02-02 DIAGNOSIS — B9689 Other specified bacterial agents as the cause of diseases classified elsewhere: Secondary | ICD-10-CM | POA: Diagnosis not present

## 2018-02-02 DIAGNOSIS — A4151 Sepsis due to Escherichia coli [E. coli]: Secondary | ICD-10-CM | POA: Insufficient documentation

## 2018-02-02 DIAGNOSIS — E876 Hypokalemia: Secondary | ICD-10-CM | POA: Diagnosis not present

## 2018-02-02 DIAGNOSIS — D72829 Elevated white blood cell count, unspecified: Secondary | ICD-10-CM | POA: Diagnosis not present

## 2018-02-02 DIAGNOSIS — N390 Urinary tract infection, site not specified: Secondary | ICD-10-CM | POA: Diagnosis not present

## 2018-02-02 DIAGNOSIS — Z0000 Encounter for general adult medical examination without abnormal findings: Secondary | ICD-10-CM | POA: Diagnosis not present

## 2018-02-02 DIAGNOSIS — N19 Unspecified kidney failure: Secondary | ICD-10-CM | POA: Diagnosis not present

## 2018-02-06 ENCOUNTER — Ambulatory Visit (INDEPENDENT_AMBULATORY_CARE_PROVIDER_SITE_OTHER): Payer: PPO | Admitting: Family Medicine

## 2018-02-06 ENCOUNTER — Encounter: Payer: Self-pay | Admitting: Family Medicine

## 2018-02-06 VITALS — BP 132/84 | HR 99 | Temp 98.2°F | Ht 64.0 in | Wt 136.7 lb

## 2018-02-06 DIAGNOSIS — A4151 Sepsis due to Escherichia coli [E. coli]: Secondary | ICD-10-CM

## 2018-02-06 DIAGNOSIS — A6004 Herpesviral vulvovaginitis: Secondary | ICD-10-CM | POA: Diagnosis not present

## 2018-02-06 DIAGNOSIS — N179 Acute kidney failure, unspecified: Secondary | ICD-10-CM | POA: Diagnosis not present

## 2018-02-06 DIAGNOSIS — D72829 Elevated white blood cell count, unspecified: Secondary | ICD-10-CM

## 2018-02-06 DIAGNOSIS — E876 Hypokalemia: Secondary | ICD-10-CM

## 2018-02-06 DIAGNOSIS — E8809 Other disorders of plasma-protein metabolism, not elsewhere classified: Secondary | ICD-10-CM

## 2018-02-06 MED ORDER — FLUCONAZOLE 150 MG PO TABS: 150.0000 mg | ORAL_TABLET | Freq: Once | ORAL | 0 refills | Status: AC

## 2018-02-06 MED ORDER — VALACYCLOVIR HCL 500 MG PO TABS: 500.0000 mg | ORAL_TABLET | Freq: Two times a day (BID) | ORAL | 0 refills | Status: DC

## 2018-02-06 MED ORDER — AMOXICILLIN-POT CLAVULANATE 875-125 MG PO TABS: 1.0000 | ORAL_TABLET | Freq: Two times a day (BID) | ORAL | 0 refills | Status: AC

## 2018-02-06 NOTE — Patient Instructions (Addendum)
Please do eat yogurt or kimchi or take a probiotic daily for the next month We want to replace the healthy germs in the gut If you notice foul, watery diarrhea in the next two months, schedule an appointment RIGHT AWAY or go to an urgent care or the emergency room if a holiday or over a weekend Do not take cholesterol medicine (atorvastatin) for three days after taking diflucan (fluconazole) Use the valacyclovir as needed for outbreaks Stay hydrated We'll get labs today If you have not heard anything from my staff in a week about any orders/referrals/studies from today, please contact us here to follow-up [phone]

## 2018-02-06 NOTE — Progress Notes (Signed)
BP 132/84   Pulse 99   Temp 98.2 F (36.8 C) (Oral)   Ht 5\' 4"  (1.626 m)   Wt 136 lb 11.2 oz (62 kg)   LMP  (LMP Unknown)   SpO2 98%   BMI 23.46 kg/m    Subjective:    Patient ID: [name], female    DOB: 12-13-1945, 72 y.o.   MRN: [id number]  HPI: [name] is a 72 y.o. female  Chief Complaint  Patient presents with  . Hospitalization Follow-up    UTI and CKD    HPI She is here for hospital f/u She was in [place], [place] at [facility] Had bad chills and really no UTI symptoms to start with They found UTI and renal failure She had taken ibuprofen, and had been drinking plenty of fluids, but mouth was still dry Kept two nights; on IV antibiotics They sent her home on cephalexin 500 mg q 12 hours; still taking Never had any burning with urination She is going to [place] Also had low K+ and low Mg2+ Checked for C diff on 02/02/18, negative WBC 19.2k, down to 13.7 by 02/02/18 They gave her potassium in the hospital and some at discharge She says her vaginal area is all raw, thinks it is the yeast infection Also get recurrent herpes down below  Depression screen [facility] 2/9 02/06/2018 01/02/2018 12/28/2017 12/12/2017 12/05/2016  Decreased Interest 0 0 0 0 0  Down, Depressed, Hopeless 0 0 0 0 0  PHQ - 2 Score 0 0 0 0 0  Altered sleeping 0 0 0 - -  Tired, decreased energy 0 0 0 - -  Change in appetite 0 0 0 - -  Feeling bad or failure about yourself  0 0 0 - -  Trouble concentrating 0 0 0 - -  Moving slowly or fidgety/restless 0 0 0 - -  Suicidal thoughts 0 0 0 - -  PHQ-9 Score 0 0 0 - -  Difficult doing work/chores Not difficult at all Not difficult at all Not difficult at all - -   Fall Risk  02/06/2018 01/29/2018 01/29/2018 01/02/2018 12/28/2017  Falls in the past year? No No No No No    Relevant past medical, surgical, family and social history reviewed Past Medical History:  Diagnosis Date  . Arthritis    in back  . Cataract 2011   remove from right eye    . Dyslipidemia, goal LDL below 130 10/28/2015  . Hormone replacement therapy (postmenopausal) 10/28/2015  . Hyperlipidemia   . Hypertension   . Osteopenia   . Patient is full code 12/05/2016  . TMJ (dislocation of temporomandibular joint)    Past Surgical History:  Procedure Laterality Date  .  MESH SLING X 15 YRS    . ANTERIOR AND POSTERIOR REPAIR    . AUGMENTATION MAMMAPLASTY Bilateral    20 yrs ago , then replaced 5 yrs ago  . BLADDER SUSPENSION    . BREAST ENHANCEMENT SURGERY    . BREAST SURGERY  2012   BILATERAL BREAST IMPLANT REMOVAL  . EYE SURGERY  20114   right eye  . INCONTINENCE SURGERY    . NASAL SINUS SURGERY    . NOSE SURGERY    . VAGINAL HYSTERECTOMY     still has ovaries   Family History  Problem Relation Age of Onset  . Cancer Mother        COLON AND UTERINE  . Cancer Father  PROSTATE  . Diabetes Father   . Hypertension Father   . Stroke Father   . Arthritis Sister   . Diabetes Son   . Breast cancer Neg Hx    Social History   Tobacco Use  . Smoking status: Never Smoker  . Smokeless tobacco: Never Used  . Tobacco comment: smoking cessation materials not required  Substance Use Topics  . Alcohol use: No  . Drug use: No     Office Visit from 02/06/2018 in [facility]  AUDIT-C Score  0      Interim medical history since last visit reviewed. Allergies and medications reviewed  Review of Systems Per HPI unless specifically indicated above     Objective:    BP 132/84   Pulse 99   Temp 98.2 F (36.8 C) (Oral)   Ht 5\' 4"  (1.626 m)   Wt 136 lb 11.2 oz (62 kg)   LMP  (LMP Unknown)   SpO2 98%   BMI 23.46 kg/m   Wt Readings from Last 3 Encounters:  02/06/18 136 lb 11.2 oz (62 kg)  01/29/18 133 lb 3.2 oz (60.4 kg)  01/02/18 129 lb (58.5 kg)    Physical Exam  Constitutional: She appears well-developed and well-nourished.  HENT:  Mouth/Throat: Mucous membranes are normal.  Eyes: EOM are normal. No scleral  icterus.  Cardiovascular: Normal rate and regular rhythm.  Pulmonary/Chest: Effort normal and breath sounds normal.  Abdominal: There is no tenderness. There is no CVA tenderness.  Skin: She is not diaphoretic. No pallor.  Psychiatric: She has a normal mood and affect. Her behavior is normal.    Results for orders placed or performed in visit on 01/29/18  CBC with Differential  Result Value Ref Range   WBC 12.6 (H) 3.8 - 10.8 Thousand/uL   RBC 4.47 3.80 - 5.10 Million/uL   Hemoglobin 14.3 11.7 - 15.5 g/dL   HCT 41.4 35.0 - 45.0 %   MCV 92.6 80.0 - 100.0 fL   MCH 32.0 27.0 - 33.0 pg   MCHC 34.5 32.0 - 36.0 g/dL   RDW 12.1 11.0 - 15.0 %   Platelets 182 140 - 400 Thousand/uL   MPV 11.0 7.5 - 12.5 fL   Neutro Abs 10,004 (H) 1,500 - 7,800 cells/uL   Lymphs Abs 970 850 - 3,900 cells/uL   WBC mixed population 1,550 (H) 200 - 950 cells/uL   Eosinophils Absolute 25 15 - 500 cells/uL   Basophils Absolute 50 0 - 200 cells/uL   Neutrophils Relative % 79.4 %   Total Lymphocyte 7.7 %   Monocytes Relative 12.3 %   Eosinophils Relative 0.2 %   Basophils Relative 0.4 %  COMPLETE METABOLIC PANEL WITH GFR  Result Value Ref Range   Glucose, Bld 83 65 - 99 mg/dL   BUN 27 (H) 7 - 25 mg/dL   Creat 0.95 (H) 0.60 - 0.93 mg/dL   GFR, Est Non African American 60 > OR = 60 mL/min/1.57m2   GFR, Est African American 70 > OR = 60 mL/min/1.90m2   BUN/Creatinine Ratio 28 (H) 6 - 22 (calc)   Sodium 138 135 - 146 mmol/L   Potassium 4.2 3.5 - 5.3 mmol/L   Chloride 101 98 - 110 mmol/L   CO2 28 20 - 32 mmol/L   Calcium 9.2 8.6 - 10.4 mg/dL   Total Protein 6.2 6.1 - 8.1 g/dL   Albumin 3.8 3.6 - 5.1 g/dL   Globulin 2.4 1.9 - 3.7 g/dL (calc)  AG Ratio 1.6 1.0 - 2.5 (calc)   Total Bilirubin 0.4 0.2 - 1.2 mg/dL   Alkaline phosphatase (APISO) 45 33 - 130 U/L   AST 22 10 - 35 U/L   ALT 15 6 - 29 U/L      Assessment & Plan:   Problem List Items Addressed This Visit    None    Visit Diagnoses    Sepsis  due to Escherichia coli (E. coli) ([place])    -  Primary   reviewed hospital records; finish out antibiotics; discussed risk of C diff   Relevant Medications   valACYclovir (VALTREX) 500 MG tablet   fluconazole (DIFLUCAN) 150 MG tablet   Other Relevant Orders   CBC with Differential/Platelet   Urinalysis w microscopic + reflex cultur   Hypomagnesemia       check Mg2+ today   Relevant Orders   Magnesium   Hypokalemia       check K+ today   Relevant Orders   COMPLETE METABOLIC PANEL WITH GFR   Acute renal injury ([place])       Cr 1.51 and then back down to normal with IV fluid in the hospital; will recheck today   Relevant Orders   COMPLETE METABOLIC PANEL WITH GFR   Hypoalbuminemia       urine protein was 100 in the hospital; recheck urine and albumin   Relevant Orders   COMPLETE METABOLIC PANEL WITH GFR   Leukocytosis, unspecified type       recheck today; hopefully will be back to normal with abx treatment   Relevant Orders   CBC with Differential/Platelet   Herpes simplex vulvovaginitis       Relevant Medications   valACYclovir (VALTREX) 500 MG tablet   fluconazole (DIFLUCAN) 150 MG tablet       Follow up plan: No follow-ups on file.  An after-visit summary was printed and given to the patient at [place].  Please see the patient instructions which may contain other information and recommendations beyond what is mentioned above in the assessment and plan.  Meds ordered this encounter  Medications  . amoxicillin-clavulanate (AUGMENTIN) 875-125 MG tablet    Sig: Take 1 tablet by mouth 2 (two) times daily for 10 days. For trip to [place]    Dispense:  14 tablet    Refill:  0  . valACYclovir (VALTREX) 500 MG tablet    Sig: Take 1 tablet (500 mg total) by mouth 2 (two) times daily. For three days in a row during outbreaks.    Dispense:  20 tablet    Refill:  0  . fluconazole (DIFLUCAN) 150 MG tablet    Sig: Take 1 tablet (150 mg total) by mouth once for 1 dose.    Dispense:   1 tablet    Refill:  0    Orders Placed This Encounter  Procedures  . CBC with Differential/Platelet  . Magnesium  . Urinalysis w microscopic + reflex cultur  . COMPLETE METABOLIC PANEL WITH GFR

## 2018-02-07 ENCOUNTER — Other Ambulatory Visit: Payer: Self-pay | Admitting: Family Medicine

## 2018-02-07 ENCOUNTER — Other Ambulatory Visit: Payer: Self-pay

## 2018-02-07 DIAGNOSIS — R7401 Elevation of levels of liver transaminase levels: Secondary | ICD-10-CM

## 2018-02-07 DIAGNOSIS — R740 Nonspecific elevation of levels of transaminase and lactic acid dehydrogenase [LDH]: Principal | ICD-10-CM

## 2018-02-07 DIAGNOSIS — E785 Hyperlipidemia, unspecified: Secondary | ICD-10-CM

## 2018-02-07 LAB — URINALYSIS W MICROSCOPIC + REFLEX CULTURE
BACTERIA UA: NONE SEEN /HPF
BILIRUBIN URINE: NEGATIVE
Glucose, UA: NEGATIVE
HGB URINE DIPSTICK: NEGATIVE
Hyaline Cast: NONE SEEN /LPF
Ketones, ur: NEGATIVE
Leukocyte Esterase: NEGATIVE
Nitrites, Initial: NEGATIVE
PH: 7 (ref 5.0–8.0)
Protein, ur: NEGATIVE
RBC / HPF: NONE SEEN /HPF (ref 0–2)
SPECIFIC GRAVITY, URINE: 1.012 (ref 1.001–1.03)

## 2018-02-07 LAB — CBC WITH DIFFERENTIAL/PLATELET
BASOS ABS: 62 {cells}/uL (ref 0–200)
Basophils Relative: 0.7 %
EOS ABS: 89 {cells}/uL (ref 15–500)
EOS PCT: 1 %
HEMATOCRIT: 36.3 % (ref 35.0–45.0)
HEMOGLOBIN: 12.4 g/dL (ref 11.7–15.5)
LYMPHS ABS: 1077 {cells}/uL (ref 850–3900)
MCH: 31.8 pg (ref 27.0–33.0)
MCHC: 34.2 g/dL (ref 32.0–36.0)
MCV: 93.1 fL (ref 80.0–100.0)
MPV: 9.5 fL (ref 7.5–12.5)
Monocytes Relative: 8.3 %
NEUTROS ABS: 6933 {cells}/uL (ref 1500–7800)
NEUTROS PCT: 77.9 %
Platelets: 316 10*3/uL (ref 140–400)
RBC: 3.9 10*6/uL (ref 3.80–5.10)
RDW: 12.5 % (ref 11.0–15.0)
Total Lymphocyte: 12.1 %
WBC: 8.9 10*3/uL (ref 3.8–10.8)
WBCMIX: 739 {cells}/uL (ref 200–950)

## 2018-02-07 LAB — COMPLETE METABOLIC PANEL WITH GFR
AG RATIO: 1.4 (calc) (ref 1.0–2.5)
ALT: 72 U/L — ABNORMAL HIGH (ref 6–29)
AST: 53 U/L — AB (ref 10–35)
Albumin: 3.4 g/dL — ABNORMAL LOW (ref 3.6–5.1)
Alkaline phosphatase (APISO): 46 U/L (ref 33–130)
BILIRUBIN TOTAL: 0.3 mg/dL (ref 0.2–1.2)
BUN: 17 mg/dL (ref 7–25)
CALCIUM: 9.1 mg/dL (ref 8.6–10.4)
CO2: 30 mmol/L (ref 20–32)
Chloride: 100 mmol/L (ref 98–110)
Creat: 0.74 mg/dL (ref 0.60–0.93)
GFR, EST NON AFRICAN AMERICAN: 82 mL/min/{1.73_m2} (ref >=60)
GFR, Est African American: 94 mL/min/{1.73_m2} (ref >=60)
Globulin: 2.4 g/dL (calc) (ref 1.9–3.7)
Glucose, Bld: 68 mg/dL (ref 65–99)
POTASSIUM: 4.1 mmol/L (ref 3.5–5.3)
SODIUM: 137 mmol/L (ref 135–146)
TOTAL PROTEIN: 5.8 g/dL — AB (ref 6.1–8.1)

## 2018-02-07 LAB — MAGNESIUM: MAGNESIUM: 1.8 mg/dL (ref 1.5–2.5)

## 2018-02-07 LAB — NO CULTURE INDICATED

## 2018-02-07 NOTE — Progress Notes (Signed)
Discussed lab results; recheck hepatic function panel tomorrow

## 2018-02-07 NOTE — Progress Notes (Signed)
No more cholesterol medicine until labs are back We'll check lipids and hepatic function panel tomorrow She missed several days over the last month, did not take any in the hospital (statin) and maybe took one since leaving the hospital

## 2018-02-07 NOTE — Patient Outreach (Signed)
[facility]) Care Management  02/07/2018  [name] Oct 19, 1945 [id number]  Transition of care  Referral date: 02/07/18 Referral source: inpatient admission from [facility] on 02/02/18 Insurance: Health team advantage  Transition of care will be completed by primary care provider office who will refer to [facility] care management if needed.   PLAN: RNCM will close patient due to patient being enrolled in an external program.   Quinn Plowman RN,BSN,CCM [facility] Telephonic  [phone]

## 2018-02-08 ENCOUNTER — Telehealth: Payer: Self-pay | Admitting: Family Medicine

## 2018-02-08 DIAGNOSIS — E785 Hyperlipidemia, unspecified: Secondary | ICD-10-CM | POA: Diagnosis not present

## 2018-02-08 DIAGNOSIS — R740 Nonspecific elevation of levels of transaminase and lactic acid dehydrogenase [LDH]: Secondary | ICD-10-CM | POA: Diagnosis not present

## 2018-02-08 LAB — HEPATIC FUNCTION PANEL
AG Ratio: 1.3 (calc) (ref 1.0–2.5)
ALBUMIN MSPROF: 3.5 g/dL — AB (ref 3.6–5.1)
ALT: 53 U/L — ABNORMAL HIGH (ref 6–29)
AST: 35 U/L (ref 10–35)
Alkaline phosphatase (APISO): 52 U/L (ref 33–130)
BILIRUBIN DIRECT: 0.1 mg/dL (ref 0.0–0.2)
BILIRUBIN TOTAL: 0.4 mg/dL (ref 0.2–1.2)
GLOBULIN: 2.6 g/dL (ref 1.9–3.7)
Indirect Bilirubin: 0.3 mg/dL (calc) (ref 0.2–1.2)
Total Protein: 6.1 g/dL (ref 6.1–8.1)

## 2018-02-08 LAB — LIPID PANEL
CHOL/HDL RATIO: 6.8 (calc) — AB (ref <5.0)
Cholesterol: 183 mg/dL (ref <200)
HDL: 27 mg/dL — ABNORMAL LOW (ref >50)
LDL CHOLESTEROL (CALC): 133 mg/dL — AB
Non-HDL Cholesterol (Calc): 156 mg/dL (calc) — ABNORMAL HIGH (ref <130)
Triglycerides: 115 mg/dL (ref <150)

## 2018-02-08 NOTE — Telephone Encounter (Signed)
Pt is asking if you would give hear refill of the yeast medication so that is needs it she will be able to get it over the weekend.

## 2018-02-08 NOTE — Telephone Encounter (Signed)
I would prefer that she use a vaginal preparation if she gets a yeast infection I don't want to send in the oral diflucan until I see that her liver is doing okay

## 2018-02-08 NOTE — Telephone Encounter (Signed)
Pt.notified

## 2018-02-12 ENCOUNTER — Telehealth: Payer: Self-pay

## 2018-02-12 DIAGNOSIS — R7401 Elevation of levels of liver transaminase levels: Secondary | ICD-10-CM

## 2018-02-12 DIAGNOSIS — R740 Nonspecific elevation of levels of transaminase and lactic acid dehydrogenase [LDH]: Principal | ICD-10-CM

## 2018-02-12 DIAGNOSIS — Z8619 Personal history of other infectious and parasitic diseases: Secondary | ICD-10-CM

## 2018-02-12 DIAGNOSIS — E785 Hyperlipidemia, unspecified: Secondary | ICD-10-CM

## 2018-02-12 NOTE — Telephone Encounter (Signed)
-----   Message from Arnetha Courser, MD sent at 02/12/2018 11:04 AM EDT ----- Yes, please order H pylori breath test

## 2018-02-22 ENCOUNTER — Telehealth: Payer: Self-pay | Admitting: Family Medicine

## 2018-03-05 ENCOUNTER — Other Ambulatory Visit: Payer: Self-pay

## 2018-03-05 NOTE — Telephone Encounter (Signed)
Pt called to request RX be updated to 90 day supply so that she doesn't have to drive to [place] every month.  [employer] on [employer]

## 2018-03-05 NOTE — Telephone Encounter (Signed)
Estrogen-methyltestosterone was just prescribed on 02/23/18 Too soon for refill Thank you

## 2018-03-06 DIAGNOSIS — H1045 Other chronic allergic conjunctivitis: Secondary | ICD-10-CM | POA: Diagnosis not present

## 2018-03-06 DIAGNOSIS — Z961 Presence of intraocular lens: Secondary | ICD-10-CM | POA: Diagnosis not present

## 2018-03-06 DIAGNOSIS — H401131 Primary open-angle glaucoma, bilateral, mild stage: Secondary | ICD-10-CM | POA: Diagnosis not present

## 2018-03-06 DIAGNOSIS — H26491 Other secondary cataract, right eye: Secondary | ICD-10-CM | POA: Diagnosis not present

## 2018-03-06 DIAGNOSIS — H40019 Open angle with borderline findings, low risk, unspecified eye: Secondary | ICD-10-CM | POA: Diagnosis not present

## 2018-03-06 DIAGNOSIS — H35039 Hypertensive retinopathy, unspecified eye: Secondary | ICD-10-CM | POA: Diagnosis not present

## 2018-03-06 DIAGNOSIS — H04129 Dry eye syndrome of unspecified lacrimal gland: Secondary | ICD-10-CM | POA: Diagnosis not present

## 2018-03-27 DIAGNOSIS — X32XXXA Exposure to sunlight, initial encounter: Secondary | ICD-10-CM | POA: Diagnosis not present

## 2018-03-27 DIAGNOSIS — L570 Actinic keratosis: Secondary | ICD-10-CM | POA: Diagnosis not present

## 2018-03-27 DIAGNOSIS — L814 Other melanin hyperpigmentation: Secondary | ICD-10-CM | POA: Diagnosis not present

## 2018-04-04 ENCOUNTER — Telehealth: Payer: Self-pay

## 2018-04-04 NOTE — Telephone Encounter (Signed)
Copied from [place] [id number]. Topic: General - Other >> Apr 04, 2018 10:54 AM Yvette Rack wrote: Reason for CRM: Pt states she will be coming in tomorrow morning 04/05/18 for labs and she would like to request a urine test as well. Cb# [phone]

## 2018-04-04 NOTE — Telephone Encounter (Signed)
Pt states that she is having some discomfort during urination. She is coming in for labs tomorrow and wants to know if we can check her urine. Please advise.

## 2018-04-04 NOTE — Telephone Encounter (Signed)
She can come today POCT urine is fine, then culture if abnormal We don't like to wait around if possible bladder infection as those can turn serious

## 2018-04-05 ENCOUNTER — Other Ambulatory Visit (INDEPENDENT_AMBULATORY_CARE_PROVIDER_SITE_OTHER): Payer: PPO

## 2018-04-05 DIAGNOSIS — R300 Dysuria: Secondary | ICD-10-CM | POA: Diagnosis not present

## 2018-04-05 DIAGNOSIS — Z8619 Personal history of other infectious and parasitic diseases: Secondary | ICD-10-CM | POA: Diagnosis not present

## 2018-04-05 DIAGNOSIS — E785 Hyperlipidemia, unspecified: Secondary | ICD-10-CM | POA: Diagnosis not present

## 2018-04-05 DIAGNOSIS — R740 Nonspecific elevation of levels of transaminase and lactic acid dehydrogenase [LDH]: Secondary | ICD-10-CM | POA: Diagnosis not present

## 2018-04-05 LAB — POCT URINALYSIS DIPSTICK
Bilirubin, UA: NEGATIVE
Glucose, UA: NEGATIVE
Ketones, UA: NEGATIVE
LEUKOCYTES UA: NEGATIVE
Nitrite, UA: NEGATIVE
Odor: NORMAL
PH UA: 5.5 (ref 5.0–8.0)
PROTEIN UA: NEGATIVE
RBC UA: NEGATIVE
SPEC GRAV UA: 1.02 (ref 1.010–1.025)
Urobilinogen, UA: 0.2 E.U./dL

## 2018-04-05 NOTE — Telephone Encounter (Signed)
Pt came in today for labs and UA was normal.

## 2018-04-06 ENCOUNTER — Telehealth: Payer: Self-pay

## 2018-04-06 ENCOUNTER — Other Ambulatory Visit: Payer: Self-pay | Admitting: Family Medicine

## 2018-04-06 DIAGNOSIS — A048 Other specified bacterial intestinal infections: Secondary | ICD-10-CM

## 2018-04-06 DIAGNOSIS — E785 Hyperlipidemia, unspecified: Secondary | ICD-10-CM

## 2018-04-06 DIAGNOSIS — Z5181 Encounter for therapeutic drug level monitoring: Secondary | ICD-10-CM

## 2018-04-06 LAB — HEPATIC FUNCTION PANEL
AG RATIO: 1.8 (calc) (ref 1.0–2.5)
ALBUMIN MSPROF: 3.9 g/dL (ref 3.6–5.1)
ALT: 12 U/L (ref 6–29)
AST: 22 U/L (ref 10–35)
Alkaline phosphatase (APISO): 47 U/L (ref 33–130)
BILIRUBIN TOTAL: 0.4 mg/dL (ref 0.2–1.2)
Bilirubin, Direct: 0.1 mg/dL (ref 0.0–0.2)
Globulin: 2.2 g/dL (calc) (ref 1.9–3.7)
Indirect Bilirubin: 0.3 mg/dL (calc) (ref 0.2–1.2)
Total Protein: 6.1 g/dL (ref 6.1–8.1)

## 2018-04-06 LAB — LIPID PANEL
CHOL/HDL RATIO: 3.4 (calc) (ref <5.0)
Cholesterol: 182 mg/dL (ref <200)
HDL: 53 mg/dL (ref >50)
LDL Cholesterol (Calc): 116 mg/dL (calc) — ABNORMAL HIGH
NON-HDL CHOLESTEROL (CALC): 129 mg/dL (ref <130)
Triglycerides: 43 mg/dL (ref <150)

## 2018-04-06 LAB — H. PYLORI BREATH TEST: H. pylori Breath Test: DETECTED — AB

## 2018-04-06 MED ORDER — PRAVASTATIN SODIUM 20 MG PO TABS: 20.0000 mg | ORAL_TABLET | Freq: Every day | ORAL | 1 refills | Status: DC

## 2018-04-06 NOTE — Progress Notes (Signed)
Start pravastatin; check labs in 4 weeks

## 2018-04-06 NOTE — Telephone Encounter (Signed)
-----   Message from Arnetha Courser, MD sent at 04/06/2018  2:34 PM EST ----- Please let the patient know that her H pylori test is unfortunately positive; please REFER to gastroenterologist, H pylori Her liver enzymes are completely back to normal Her LDL is higher than we want; is she willing to go back on a lower dose of a different cholesterol medicine and see how that does; please ORDER lipids and hepatic function to be done in 4 weeks  The 10-year ASCVD risk score Mikey Bussing DC Brooke Bonito., et al., 2013) is: 11%   Values used to calculate the score:     Age: 72 years     Sex: Female     Is Non-Hispanic African American: No     Diabetic: No     Tobacco smoker: No     Systolic Blood Pressure: 601 mmHg     Is BP treated: No     HDL Cholesterol: 53 mg/dL     Total Cholesterol: 182 mg/dL  Thank you.

## 2018-04-17 ENCOUNTER — Other Ambulatory Visit: Payer: Self-pay | Admitting: Family Medicine

## 2018-04-17 ENCOUNTER — Ambulatory Visit (INDEPENDENT_AMBULATORY_CARE_PROVIDER_SITE_OTHER): Payer: PPO | Admitting: Gastroenterology

## 2018-04-17 ENCOUNTER — Encounter: Payer: Self-pay | Admitting: Gastroenterology

## 2018-04-17 VITALS — BP 165/89 | HR 67 | Ht 64.0 in | Wt 135.8 lb

## 2018-04-17 DIAGNOSIS — R12 Heartburn: Secondary | ICD-10-CM | POA: Diagnosis not present

## 2018-04-17 DIAGNOSIS — A048 Other specified bacterial intestinal infections: Secondary | ICD-10-CM | POA: Diagnosis not present

## 2018-04-17 NOTE — Progress Notes (Signed)
[name]  [place], [place] [address]  Main: [phone]  Fax: [phone]   Gastroenterology Consultation  Referring Provider:     Arnetha Courser, MD Primary Care Physician:  Arnetha Courser, MD Primary Gastroenterologist:  Dr. Vonda Antigua Reason for Consultation:     H. pylori        HPI:    Chief Complaint  Patient presents with  . New Patient (Initial Visit)    referral from [facility]: positive H pylori x2, first tx did not resolve    [name] is a 72 y.o. y/o female referred for consultation & management  by Dr. Sanda Klein, Satira Anis, MD.  Patient reports history of H. pylori in the past that was treated.  Review of previous records show positive H. pylori breath test 4 months ago that was treated by her primary care provider.  Patient denies any abdominal pain when this was diagnosed and states her main complaint was heartburn.  States after treatment with the antibiotics her heartburn has resolved and now she only has symptoms once every other week and she will take some over-the-counter Tums.  She is not on any daily medications for this.  No dysphagia.  No weight loss.  H. pylori breath test was repeated and is positive again but patient is completely asymptomatic.  No prior EGD.  Last colonoscopy was in July 2015 for high risk screening due to colon cancer in her mother at less than 37 years old.  One 5 mm polyp removed, diverticulosis and hemorrhoids seen.  Pathology report from the polyp removal is not available to me.  Repeat is recommended in 5 years.  Past Medical History:  Diagnosis Date  . Arthritis    in back  . Cataract 2011   remove from right eye  . Dyslipidemia, goal LDL below 130 10/28/2015  . Hormone replacement therapy (postmenopausal) 10/28/2015  . Hyperlipidemia   . Hypertension   . Osteopenia   . Patient is full code 12/05/2016  . TMJ (dislocation of temporomandibular joint)     Past Surgical  History:  Procedure Laterality Date  .  MESH SLING X 15 YRS    . ANTERIOR AND POSTERIOR REPAIR    . AUGMENTATION MAMMAPLASTY Bilateral    20 yrs ago , then replaced 5 yrs ago  . BLADDER SUSPENSION    . BREAST ENHANCEMENT SURGERY    . BREAST SURGERY  2012   BILATERAL BREAST IMPLANT REMOVAL  . EYE SURGERY  20114   right eye  . INCONTINENCE SURGERY    . NASAL SINUS SURGERY    . NOSE SURGERY    . VAGINAL HYSTERECTOMY     still has ovaries    Prior to Admission medications   Medication Sig Start Date End Date Taking? Authorizing Provider  Alfalfa 250 MG TABS Take by mouth.   Yes [provider]  calcium carbonate (OS-CAL) 600 MG TABS Take 600 mg by mouth as needed.    Yes [provider]  celecoxib (CELEBREX) 200 MG capsule Take 200 mg by mouth daily.  08/12/13  Yes [provider]  chloroquine (ARALEN) 250 MG tablet Take 2 tablets (500 mg total) by mouth once a week. 10/28/15  Yes Lada, Satira Anis, MD  cholecalciferol (VITAMIN D) 1000 UNITS tablet Take 1,000 Units by mouth daily.   Yes [provider]  EST ESTROGENS-METHYLTEST DS 1.25-2.5 MG TABS TAKE ONE TABLET BY MOUTH ONE TIME DAILY  02/23/18  Yes Lada, Satira Anis, MD  fish oil-omega-3 fatty acids 1000 MG capsule Take 2 g by mouth daily.   Yes [provider]  gabapentin (NEURONTIN) 300 MG capsule Take 1 capsule (300 mg total) by mouth at bedtime. 03/09/17  Yes Hubbard Hartshorn, FNP  Multiple Vitamin (MULTIVITAMIN) capsule Take 1 capsule by mouth daily.   Yes [provider]  pravastatin (PRAVACHOL) 20 MG tablet Take 1 tablet (20 mg total) by mouth daily. 04/06/18  Yes Lada, Satira Anis, MD  tolterodine (DETROL LA) 4 MG 24 hr capsule Take 1 capsule (4 mg total) by mouth daily. 10/30/17  Yes Lada, Satira Anis, MD  triamcinolone cream (KENALOG) 0.5 % Apply 1 application topically 3 (three) times daily. As needed 02/15/16  Yes Lada, Satira Anis, MD  valACYclovir (VALTREX) 500 MG tablet Take 1  tablet (500 mg total) by mouth 2 (two) times daily. For three days in a row during outbreaks. 02/06/18  Yes Lada, Satira Anis, MD  multivitamin-lutein (OCUVITE-LUTEIN) CAPS Take 1 capsule by mouth daily.    [provider]    Family History  Problem Relation Age of Onset  . Cancer Mother        COLON AND UTERINE  . Cancer Father        PROSTATE  . Diabetes Father   . Hypertension Father   . Stroke Father   . Arthritis Sister   . Diabetes Son   . Breast cancer Neg Hx      Social History   Tobacco Use  . Smoking status: Never Smoker  . Smokeless tobacco: Never Used  . Tobacco comment: smoking cessation materials not required  Substance Use Topics  . Alcohol use: No  . Drug use: No    Allergies as of 04/17/2018 - Review Complete 04/17/2018  Allergen Reaction Noted  . Tizanidine Other (See Comments) 12/12/2017    Review of Systems:    All systems reviewed and negative except where noted in HPI.   Physical Exam:  BP (!) 159/83   Pulse 67   Ht 5\' 4"  (1.626 m)   Wt 135 lb 12.8 oz (61.6 kg)   LMP  (LMP Unknown)   BMI 23.31 kg/m  No LMP recorded (lmp unknown). Patient has had a hysterectomy. Psych:  Alert and cooperative. Normal mood and affect. General:   Alert,  Well-developed, well-nourished, pleasant and cooperative in NAD Head:  Normocephalic and atraumatic. Eyes:  Sclera clear, no icterus.   Conjunctiva pink. Ears:  Normal auditory acuity. Nose:  No deformity, discharge, or lesions. Mouth:  No deformity or lesions,oropharynx pink & moist. Neck:  Supple; no masses or thyromegaly. Abdomen:  Normal bowel sounds.  No bruits.  Soft, non-tender and non-distended without masses, hepatosplenomegaly or hernias noted.  No guarding or rebound tenderness.    Msk:  Symmetrical without gross deformities. Good, equal movement & strength bilaterally. Pulses:  Normal pulses noted. Extremities:  No clubbing or edema.  No cyanosis. Neurologic:  Alert and oriented x3;   grossly normal neurologically. Skin:  Intact without significant lesions or rashes. No jaundice. Lymph Nodes:  No significant cervical adenopathy. Psych:  Alert and cooperative. Normal mood and affect.   Labs: CBC    Component Value Date/Time   WBC 8.9 02/06/2018 1003   RBC 3.90 02/06/2018 1003   HGB 12.4 02/06/2018 1003   HGB 14.2 10/29/2015 0805   HCT 36.3 02/06/2018 1003   HCT 41.6 10/29/2015 0805   PLT 316 02/06/2018 1003  PLT 186 10/29/2015 0805   MCV 93.1 02/06/2018 1003   MCV 93 10/29/2015 0805   MCH 31.8 02/06/2018 1003   MCHC 34.2 02/06/2018 1003   RDW 12.5 02/06/2018 1003   RDW 13.0 10/29/2015 0805   LYMPHSABS 1,077 02/06/2018 1003   LYMPHSABS 1.3 10/29/2015 0805   MONOABS 494 12/09/2016 0823   EOSABS 89 02/06/2018 1003   EOSABS 0.2 10/29/2015 0805   BASOSABS 62 02/06/2018 1003   BASOSABS 0.0 10/29/2015 0805   CMP     Component Value Date/Time   NA 137 02/06/2018 1003   NA 141 10/29/2015 0805   K 4.1 02/06/2018 1003   CL 100 02/06/2018 1003   CO2 30 02/06/2018 1003   GLUCOSE 68 02/06/2018 1003   BUN 17 02/06/2018 1003   BUN 19 10/29/2015 0805   CREATININE 0.74 02/06/2018 1003   CALCIUM 9.1 02/06/2018 1003   PROT 6.1 04/05/2018 0836   PROT 6.2 10/27/2014 0940   ALBUMIN 3.9 12/09/2016 0823   ALBUMIN 4.1 10/27/2014 0940   AST 22 04/05/2018 0836   ALT 12 04/05/2018 0836   ALKPHOS 49 12/09/2016 0823   BILITOT 0.4 04/05/2018 0836   BILITOT 0.3 10/27/2014 0940   GFRNONAA 82 02/06/2018 1003   GFRAA 94 02/06/2018 1003    Imaging Studies: No results found.  Assessment and Plan:   [name] is a 72 y.o. y/o female has been referred for positive H. pylori test  Patient's breath test after triple therapy is positive, however she is completely asymptomatic  Given persistent H pylori, EGD would allow [place] to rule out any underlying lesions associated with H. pylori such as ulcers or malignancy This was discussed with the patient along with  alternatives of repeat therapy for the persistent H. pylori, or no further therapy given that she is asymptomatic.  Patient would like to evaluate further with EGD  Even if EGD is normal, biopsies would help [place] evaluate for H. pylori at that time  If present, repeat treatment with quadruple therapy would be indicated since initial therapy did not lead to eradication  I have discussed alternative options, risks & benefits,  which include, but are not limited to, bleeding, infection, perforation,respiratory complication & drug reaction.  The patient agrees with this plan & written consent will be obtained.    Repeat colon screening due in July 2020  Dr Carolyn Antigua  Speech recognition software was used to dictate the above note.

## 2018-04-18 ENCOUNTER — Ambulatory Visit
Admission: RE | Admit: 2018-04-18 | Discharge: 2018-04-18 | Disposition: A | Discharge status: Home / Self Care | Payer: PPO | Source: Ambulatory Visit | Attending: Family Medicine | Admitting: Family Medicine

## 2018-04-18 DIAGNOSIS — M85832 Other specified disorders of bone density and structure, left forearm: Secondary | ICD-10-CM | POA: Diagnosis not present

## 2018-04-18 DIAGNOSIS — Z780 Asymptomatic menopausal state: Secondary | ICD-10-CM | POA: Insufficient documentation

## 2018-04-18 DIAGNOSIS — Z13820 Encounter for screening for osteoporosis: Secondary | ICD-10-CM | POA: Diagnosis not present

## 2018-04-18 DIAGNOSIS — M8580 Other specified disorders of bone density and structure, unspecified site: Secondary | ICD-10-CM | POA: Diagnosis present

## 2018-04-18 NOTE — Progress Notes (Signed)
[name], please let the patient know that: Your bone density shows that you have osteopenia, which means that your bone is thinner than it should be but not as far along as osteoporosis.  Your risk of a fracture is slightly elevated, but not to the point of requiring medication at this point. We should get another bone scan in two years to monitor. Please do practice fall precautions (don't get up on chairs or ladders to reach high things, don't go out on slippery steps in the winter, always use handrails when going up or down stairs, etc.). Try to get three servings of calcium a day (best in foods/drinks like kale, spinach, broccoli, almond milk, tofu, etc.). Get 1000 iu of OTC vitamin D3 or adequate sun exposure to help with vitamin D.

## 2018-04-19 ENCOUNTER — Ambulatory Visit: Payer: PPO | Admitting: Gastroenterology

## 2018-04-23 ENCOUNTER — Ambulatory Visit (INDEPENDENT_AMBULATORY_CARE_PROVIDER_SITE_OTHER): Payer: PPO | Admitting: Nurse Practitioner

## 2018-04-23 ENCOUNTER — Encounter: Payer: Self-pay | Admitting: Nurse Practitioner

## 2018-04-23 VITALS — BP 160/80 | HR 85 | Temp 97.5°F | Resp 16 | Ht 64.0 in | Wt 136.5 lb

## 2018-04-23 DIAGNOSIS — I10 Essential (primary) hypertension: Secondary | ICD-10-CM

## 2018-04-23 DIAGNOSIS — H6120 Impacted cerumen, unspecified ear: Secondary | ICD-10-CM | POA: Diagnosis not present

## 2018-04-23 MED ORDER — CHLORTHALIDONE 25 MG PO TABS: 25.0000 mg | ORAL_TABLET | Freq: Every day | ORAL | 0 refills | Status: DC

## 2018-04-23 NOTE — Patient Instructions (Addendum)
- Please start taking chlorathalidone 12.5mg  for 4 days (half a tab) and then increase to whole tablet daily and follow-up in 2 weeks for blood pressure recheck.  DASH Eating Plan DASH stands for "Dietary Approaches to Stop Hypertension." The DASH eating plan is a healthy eating plan that has been shown to reduce high blood pressure (hypertension). It may also reduce your risk for type 2 diabetes, heart disease, and stroke. The DASH eating plan may also help with weight loss. What are tips for following this plan? General guidelines  Avoid eating more than 2,300 mg (milligrams) of salt (sodium) a day. If you have hypertension, you may need to reduce your sodium intake to 1,500 mg a day.  Limit alcohol intake to no more than 1 drink a day for nonpregnant women and 2 drinks a day for men. One drink equals 12 oz of beer, 5 oz of wine, or 1 oz of hard liquor.  Work with your health care provider to maintain a healthy body weight or to lose weight. Ask what an ideal weight is for you.  Get at least 30 minutes of exercise that causes your heart to beat faster (aerobic exercise) most days of the week. Activities may include walking, swimming, or biking.  Work with your health care provider or diet and nutrition specialist (dietitian) to adjust your eating plan to your individual calorie needs. Reading food labels  Check food labels for the amount of sodium per serving. Choose foods with less than 5 percent of the Daily Value of sodium. Generally, foods with less than 300 mg of sodium per serving fit into this eating plan.  To find whole grains, look for the word "whole" as the first word in the ingredient list. Shopping  Buy products labeled as "low-sodium" or "no salt added."  Buy fresh foods. Avoid canned foods and premade or frozen meals. Cooking  Avoid adding salt when cooking. Use salt-free seasonings or herbs instead of table salt or sea salt. Check with your health care provider or  pharmacist before using salt substitutes.  Do not fry foods. Cook foods using healthy methods such as baking, boiling, grilling, and broiling instead.  Cook with heart-healthy oils, such as olive, canola, soybean, or sunflower oil. Meal planning   Eat a balanced diet that includes: ? 5 or more servings of fruits and vegetables each day. At each meal, try to fill half of your plate with fruits and vegetables. ? Up to 6-8 servings of whole grains each day. ? Less than 6 oz of lean meat, poultry, or fish each day. A 3-oz serving of meat is about the same size as a deck of cards. One egg equals 1 oz. ? 2 servings of low-fat dairy each day. ? A serving of nuts, seeds, or beans 5 times each week. ? Heart-healthy fats. Healthy fats called Omega-3 fatty acids are found in foods such as flaxseeds and coldwater fish, like sardines, salmon, and mackerel.  Limit how much you eat of the following: ? Canned or prepackaged foods. ? Food that is high in trans fat, such as fried foods. ? Food that is high in saturated fat, such as fatty meat. ? Sweets, desserts, sugary drinks, and other foods with added sugar. ? Full-fat dairy products.  Do not salt foods before eating.  Try to eat at least 2 vegetarian meals each week.  Eat more home-cooked food and less restaurant, buffet, and fast food.  When eating at a restaurant, ask that your food  be prepared with less salt or no salt, if possible. What foods are recommended? The items listed may not be a complete list. Talk with your dietitian about what dietary choices are best for you. Grains Whole-grain or whole-wheat bread. Whole-grain or whole-wheat pasta. Brown rice. [place]. Bulgur. Whole-grain and low-sodium cereals. Pita bread. Low-fat, low-sodium crackers. Whole-wheat flour tortillas. Vegetables Fresh or frozen vegetables (raw, steamed, roasted, or grilled). Low-sodium or reduced-sodium tomato and vegetable juice. Low-sodium or  reduced-sodium tomato sauce and tomato paste. Low-sodium or reduced-sodium canned vegetables. Fruits All fresh, dried, or frozen fruit. Canned fruit in natural juice (without added sugar). Meat and other protein foods Skinless chicken or [place]. Ground chicken or [place]. Pork with fat trimmed off. Fish and seafood. Egg whites. Dried beans, peas, or lentils. Unsalted nuts, nut butters, and seeds. Unsalted canned beans. Lean cuts of beef with fat trimmed off. Low-sodium, lean deli meat. Dairy Low-fat (1%) or fat-free (skim) milk. Fat-free, low-fat, or reduced-fat cheeses. Nonfat, low-sodium ricotta or cottage cheese. Low-fat or nonfat yogurt. Low-fat, low-sodium cheese. Fats and oils Soft margarine without trans fats. Vegetable oil. Low-fat, reduced-fat, or light mayonnaise and salad dressings (reduced-sodium). Canola, safflower, olive, soybean, and sunflower oils. Avocado. Seasoning and other foods Herbs. Spices. Seasoning mixes without salt. Unsalted popcorn and pretzels. Fat-free sweets. What foods are not recommended? The items listed may not be a complete list. Talk with your dietitian about what dietary choices are best for you. Grains Baked goods made with fat, such as croissants, muffins, or some breads. Dry pasta or rice meal packs. Vegetables Creamed or fried vegetables. Vegetables in a cheese sauce. Regular canned vegetables (not low-sodium or reduced-sodium). Regular canned tomato sauce and paste (not low-sodium or reduced-sodium). Regular tomato and vegetable juice (not low-sodium or reduced-sodium). Angie Fava. Olives. Fruits Canned fruit in a light or heavy syrup. Fried fruit. Fruit in cream or butter sauce. Meat and other protein foods Fatty cuts of meat. Ribs. Fried meat. Berniece Salines. Sausage. Bologna and other processed lunch meats. Salami. Fatback. Hotdogs. Bratwurst. Salted nuts and seeds. Canned beans with added salt. Canned or smoked fish. Whole eggs or egg yolks. Chicken or [place]  with skin. Dairy Whole or 2% milk, cream, and half-and-half. Whole or full-fat cream cheese. Whole-fat or sweetened yogurt. Full-fat cheese. Nondairy creamers. Whipped toppings. Processed cheese and cheese spreads. Fats and oils Butter. Stick margarine. Lard. Shortening. Ghee. Bacon fat. Tropical oils, such as coconut, palm kernel, or palm oil. Seasoning and other foods Salted popcorn and pretzels. Onion salt, garlic salt, seasoned salt, table salt, and sea salt. Worcestershire sauce. Tartar sauce. Barbecue sauce. Teriyaki sauce. Soy sauce, including reduced-sodium. Steak sauce. Canned and packaged gravies. Fish sauce. Oyster sauce. Cocktail sauce. Horseradish that you find on the shelf. Ketchup. Mustard. Meat flavorings and tenderizers. Bouillon cubes. Hot sauce and Tabasco sauce. Premade or packaged marinades. Premade or packaged taco seasonings. Relishes. Regular salad dressings. Where to find more information:  National Heart, Lung, and [place]: https://wilson-eaton.com/  American Heart Association: www.heart.org Summary  The DASH eating plan is a healthy eating plan that has been shown to reduce high blood pressure (hypertension). It may also reduce your risk for type 2 diabetes, heart disease, and stroke.  With the DASH eating plan, you should limit salt (sodium) intake to 2,300 mg a day. If you have hypertension, you may need to reduce your sodium intake to 1,500 mg a day.  When on the DASH eating plan, aim to eat more fresh fruits and vegetables, whole  grains, lean proteins, low-fat dairy, and heart-healthy fats.  Work with your health care provider or diet and nutrition specialist (dietitian) to adjust your eating plan to your individual calorie needs. This information is not intended to replace advice given to you by your health care provider. Make sure you discuss any questions you have with your health care provider. Document Released: 04/21/2011 Document Revised: 04/25/2016  Document Reviewed: 04/25/2016 Elsevier Interactive Patient Education  [employer].

## 2018-04-23 NOTE — Progress Notes (Signed)
Name: [name]   MRN: [id number]    DOB: 12/09/1945   Date:04/23/2018       Progress Note  Subjective  Chief Complaint  Chief Complaint  Patient presents with  . Cerumen Impaction  . Blood Pressure Check    HPI  Patient notes increased blood pressure at doctors office last week- 892 systolic had it rechecked and was still elevated- has had it checked at drug store and was still elevated.  No increase stress, no change in diet or active, no over the counter decongestants, takes celebrex daily- for years; does not take ibuprofen, naproxen. Doesn't eat a lot of red or processed meats.   BP Readings from Last 3 Encounters:  04/23/18 (!) 160/80  04/17/18 (!) 165/89  02/06/18 132/84   Denies chest pain, shortness of breath, headaches, blurry vision, hot flashes, palpitations, flushing, dizziness.   Patient Active Problem List   Diagnosis Date Noted  . Osteopenia 12/12/2017  . Hormone replacement therapy (HRT) 12/12/2017  . Patient is full code 12/05/2016  . Insomnia 04/10/2016  . Hypertensive retinopathy 02/15/2016  . Urine incontinence 02/15/2016  . Breast cancer screening 10/28/2015  . Medication monitoring encounter 10/28/2015  . Dyslipidemia, goal LDL below 100 10/28/2015  . Hormone replacement therapy (postmenopausal) 10/28/2015  . Preventative health care 10/28/2015  . Post-menopausal 10/07/2015  . Diarrhea, travelers' 09/08/2015  . 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 Dermatitis 05/15/2015    Past Medical History:  Diagnosis Date  . Arthritis    in back  . Cataract 2011   remove from right eye  . Dyslipidemia, goal LDL below 130 10/28/2015  . Hormone replacement therapy (postmenopausal) 10/28/2015  . Hyperlipidemia   . Hypertension   . Osteopenia   . Patient is full code 12/05/2016  . TMJ (dislocation of temporomandibular joint)     Past Surgical History:  Procedure Laterality Date  .  MESH SLING X 15 YRS    . ANTERIOR AND POSTERIOR REPAIR    .  AUGMENTATION MAMMAPLASTY Bilateral    20 yrs ago , then replaced 5 yrs ago  . BLADDER SUSPENSION    . BREAST ENHANCEMENT SURGERY    . BREAST SURGERY  2012   BILATERAL BREAST IMPLANT REMOVAL  . EYE SURGERY  20114   right eye  . INCONTINENCE SURGERY    . NASAL SINUS SURGERY    . NOSE SURGERY    . VAGINAL HYSTERECTOMY     still has ovaries    Social History   Tobacco Use  . Smoking status: Never Smoker  . Smokeless tobacco: Never Used  . Tobacco comment: smoking cessation materials not required  Substance Use Topics  . Alcohol use: No     Current Outpatient Medications:  .  calcium carbonate (OS-CAL) 600 MG TABS, Take 600 mg by mouth as needed. , Disp: , Rfl:  .  celecoxib (CELEBREX) 200 MG capsule, Take 200 mg by mouth daily. , Disp: , Rfl:  .  chloroquine (ARALEN) 250 MG tablet, Take 2 tablets (500 mg total) by mouth once a week., Disp: 30 tablet, Rfl: 0 .  cholecalciferol (VITAMIN D) 1000 UNITS tablet, Take 1,000 Units by mouth daily., Disp: , Rfl:  .  EST ESTROGENS-METHYLTEST DS 1.25-2.5 MG TABS, TAKE ONE TABLET BY MOUTH ONE TIME DAILY , Disp: 30 each, Rfl: 2 .  fish oil-omega-3 fatty acids 1000 MG capsule, Take 2 g by mouth daily., Disp: , Rfl:  .  gabapentin (NEURONTIN) 300 MG capsule, Take 1 capsule (  300 mg total) by mouth at bedtime., Disp: , Rfl:  .  Multiple Vitamin (MULTIVITAMIN) capsule, Take 1 capsule by mouth daily., Disp: , Rfl:  .  pravastatin (PRAVACHOL) 20 MG tablet, Take 1 tablet (20 mg total) by mouth daily., Disp: 30 tablet, Rfl: 1 .  tolterodine (DETROL LA) 4 MG 24 hr capsule, Take 1 capsule (4 mg total) by mouth daily., Disp: 90 capsule, Rfl: 1 .  triamcinolone cream (KENALOG) 0.5 %, Apply 1 application topically 3 (three) times daily. As needed, Disp: 30 g, Rfl: 0 .  valACYclovir (VALTREX) 500 MG tablet, Take 1 tablet (500 mg total) by mouth 2 (two) times daily. For three days in a row during outbreaks., Disp: 20 tablet, Rfl: 0 .  Alfalfa 250 MG TABS, Take  by mouth., Disp: , Rfl:  .  multivitamin-lutein (OCUVITE-LUTEIN) CAPS, Take 1 capsule by mouth daily., Disp: , Rfl:   Allergies  Allergen Reactions  . Tizanidine Other (See Comments)    Unable to sleep.    ROS   No other specific complaints in a complete review of systems (except as listed in HPI above).  Objective  Vitals:   04/23/18 1004 04/23/18 1012  BP: (!) 160/78 (!) 160/80  Pulse: 85   Resp: 16   Temp: (!) 97.5 F (36.4 C)   TempSrc: Oral   SpO2: 99%   Weight: 136 lb 8 oz (61.9 kg)   Height: 5\' 4"  (1.626 m)     Body mass index is 23.43 kg/m.  Nursing Note and Vital Signs reviewed.  Physical Exam  Constitutional: She is oriented to person, place, and time. She appears well-developed and well-nourished.  HENT:  Head: Normocephalic and atraumatic.  Right Ear: Hearing, tympanic membrane, external ear and ear canal normal.  Left Ear: Hearing, tympanic membrane, external ear and ear canal normal.  Mouth/Throat: No oropharyngeal exudate.  Cerumen impacted prior to ear irrigation   Eyes: Conjunctivae are normal.  Neck: Normal range of motion. Neck supple. Carotid bruit is not present.  Cardiovascular: Normal heart sounds and intact distal pulses.  Pulmonary/Chest: Effort normal and breath sounds normal.  Abdominal: Soft. Bowel sounds are normal. There is no tenderness. There is no CVA tenderness (no renal bruits auscultated).  Musculoskeletal: Normal range of motion.  Neurological: She is alert and oriented to person, place, and time. She has normal strength. No sensory deficit. GCS eye subscore is 4. GCS verbal subscore is 5. GCS motor subscore is 6.  Skin: Skin is warm, dry and intact. Capillary refill takes less than 2 seconds.  Psychiatric: She has a normal mood and affect. Her speech is normal and behavior is normal. Judgment and thought content normal.  Vitals reviewed.    No results found for this or any previous visit (from the past 48  hour(s)).  Assessment & Plan  1. Hypertension, unspecified type Discussed DASH guidelines, follow up in 2 weeks for BP recheck and one month for routine appointment.  - Basic Metabolic Panel (BMET) - TSH - chlorthalidone (HYGROTON) 25 MG tablet; Take 1 tablet (25 mg total) by mouth daily.  Dispense: 30 tablet; Refill: 0  2. Impacted cerumen, unspecified laterality  - Ear Lavage

## 2018-04-24 ENCOUNTER — Telehealth: Payer: Self-pay | Admitting: Gastroenterology

## 2018-04-24 LAB — BASIC METABOLIC PANEL
BUN: 21 mg/dL (ref 7–25)
CO2: 31 mmol/L (ref 20–32)
Calcium: 9.4 mg/dL (ref 8.6–10.4)
Chloride: 104 mmol/L (ref 98–110)
Creat: 0.68 mg/dL (ref 0.60–0.93)
Glucose, Bld: 71 mg/dL (ref 65–99)
Potassium: 4.6 mmol/L (ref 3.5–5.3)
Sodium: 140 mmol/L (ref 135–146)

## 2018-04-24 LAB — TSH: TSH: 0.5 mIU/L (ref 0.40–4.50)

## 2018-04-24 NOTE — Telephone Encounter (Signed)
Pt would like a call back to discuss which medication to stop taking prior to her procedure.

## 2018-04-25 ENCOUNTER — Other Ambulatory Visit: Payer: Self-pay | Admitting: Family Medicine

## 2018-04-25 NOTE — Telephone Encounter (Signed)
LMTCO.

## 2018-04-25 NOTE — Telephone Encounter (Signed)
Copied from [place] [id number]. Topic: Quick Communication - See Telephone Encounter >> Apr 25, 2018  3:48 PM Ivar Drape wrote: CRM for notification. See Telephone encounter for: 04/25/18. Patient would like a new script sent to [facility] in [place] for her EST ESTROGENS-METHYLTEST DS 1.25-2.5 MG TABS medication and have it changed to a 64mth supply instead of a 30 days supply.  She would like a years supply.

## 2018-04-26 ENCOUNTER — Telehealth: Payer: Self-pay | Admitting: Gastroenterology

## 2018-04-26 MED ORDER — EST ESTROGENS-METHYLTEST DS 1.25-2.5 MG PO TABS: 1.0000 | ORAL_TABLET | Freq: Every day | ORAL | 2 refills | Status: DC

## 2018-04-26 NOTE — Telephone Encounter (Signed)
Pt left vm she is scheduled for  Endoscopy 05/04/2018 she was just put on a new rx For Blood pressure and she wants to know if she needs to stop taking it prior to apt please advise

## 2018-04-26 NOTE — Telephone Encounter (Signed)
I can allow enough roughly until her next mammo is due which will be Dec 26, 2018, so I am approving 90 + 2 RF; please fax to [facility] Maintenance  Topic Date Due  . COLONOSCOPY  11/25/2018  . MAMMOGRAM  12/26/2018  . DEXA SCAN  04/18/2020  . TETANUS/TDAP  03/12/2023  . INFLUENZA VACCINE  Completed  . Hepatitis C Screening  Completed  . PNA vac Low Risk Adult  Completed

## 2018-04-26 NOTE — Telephone Encounter (Signed)
I spoke with pt and in the meantime pt had found paperwork given to her by our office for her procedure. Pt was started on chlorthalidone 25mg . Pt to take medication as instructed.

## 2018-05-01 NOTE — Telephone Encounter (Signed)
Please [place] and CANCEL first, as this is a controlled susbstance Then please tee up for correct pharmacy and correct days supply

## 2018-05-01 NOTE — Telephone Encounter (Signed)
Patient calling and states that this medication was sent to the wrong pharmacy. Would like it sent to [facility] # [address] [place], [place] as a 90 day supply. Please advise. Would like a call back once this is complete. Thank you.

## 2018-05-02 MED ORDER — EST ESTROGENS-METHYLTEST DS 1.25-2.5 MG PO TABS: 1.0000 | ORAL_TABLET | Freq: Every day | ORAL | 2 refills | Status: DC

## 2018-05-02 NOTE — Telephone Encounter (Signed)
RX cancel at [place] with kent

## 2018-05-02 NOTE — Telephone Encounter (Signed)
Thank you New Rx ready to fax to [facility]

## 2018-05-02 NOTE — Addendum Note (Signed)
Addended by: Docia Furl on: 05/02/2018 12:02 PM   Modules accepted: Orders

## 2018-05-02 NOTE — Addendum Note (Signed)
Addended by: LADA, Satira Anis on: 05/02/2018 12:45 PM   Modules accepted: Orders

## 2018-05-04 ENCOUNTER — Encounter
Admission: RE | Disposition: A | Discharge status: Home / Self Care | Payer: Self-pay | Source: Home / Self Care | Attending: Gastroenterology

## 2018-05-04 ENCOUNTER — Ambulatory Visit
Admission: RE | Admit: 2018-05-04 | Discharge: 2018-05-04 | Disposition: A | Discharge status: Home / Self Care | Payer: PPO | Attending: Gastroenterology | Admitting: Gastroenterology

## 2018-05-04 ENCOUNTER — Ambulatory Visit: Payer: PPO | Admitting: Anesthesiology

## 2018-05-04 ENCOUNTER — Encounter: Payer: Self-pay | Admitting: Anesthesiology

## 2018-05-04 DIAGNOSIS — Z79899 Other long term (current) drug therapy: Secondary | ICD-10-CM | POA: Diagnosis not present

## 2018-05-04 DIAGNOSIS — R12 Heartburn: Secondary | ICD-10-CM

## 2018-05-04 DIAGNOSIS — E785 Hyperlipidemia, unspecified: Secondary | ICD-10-CM | POA: Diagnosis not present

## 2018-05-04 DIAGNOSIS — K228 Other specified diseases of esophagus: Secondary | ICD-10-CM | POA: Diagnosis not present

## 2018-05-04 DIAGNOSIS — A048 Other specified bacterial intestinal infections: Secondary | ICD-10-CM | POA: Diagnosis not present

## 2018-05-04 DIAGNOSIS — M8580 Other specified disorders of bone density and structure, unspecified site: Secondary | ICD-10-CM | POA: Diagnosis not present

## 2018-05-04 DIAGNOSIS — M1990 Unspecified osteoarthritis, unspecified site: Secondary | ICD-10-CM | POA: Diagnosis not present

## 2018-05-04 DIAGNOSIS — K210 Gastro-esophageal reflux disease with esophagitis: Secondary | ICD-10-CM | POA: Diagnosis not present

## 2018-05-04 DIAGNOSIS — K2930 Chronic superficial gastritis without bleeding: Secondary | ICD-10-CM | POA: Diagnosis not present

## 2018-05-04 DIAGNOSIS — K2289 Other specified disease of esophagus: Secondary | ICD-10-CM

## 2018-05-04 DIAGNOSIS — B9681 Helicobacter pylori [H. pylori] as the cause of diseases classified elsewhere: Secondary | ICD-10-CM | POA: Insufficient documentation

## 2018-05-04 DIAGNOSIS — K2950 Unspecified chronic gastritis without bleeding: Secondary | ICD-10-CM | POA: Insufficient documentation

## 2018-05-04 DIAGNOSIS — K3189 Other diseases of stomach and duodenum: Secondary | ICD-10-CM | POA: Diagnosis not present

## 2018-05-04 DIAGNOSIS — I10 Essential (primary) hypertension: Secondary | ICD-10-CM | POA: Diagnosis not present

## 2018-05-04 HISTORY — PX: ESOPHAGOGASTRODUODENOSCOPY (EGD) WITH PROPOFOL: SHX5813

## 2018-05-04 SURGERY — ESOPHAGOGASTRODUODENOSCOPY (EGD) WITH PROPOFOL
Anesthesia: General

## 2018-05-04 MED ORDER — PROPOFOL 500 MG/50ML IV EMUL
INTRAVENOUS | Status: AC
  Filled 2018-05-04: qty 50

## 2018-05-04 MED ORDER — SODIUM CHLORIDE 0.9 % IV SOLN
INTRAVENOUS | Status: DC
  Administered 2018-05-04: 1000 mL via INTRAVENOUS

## 2018-05-04 MED ORDER — PROPOFOL 10 MG/ML IV BOLUS
INTRAVENOUS | Status: DC | PRN
  Administered 2018-05-04: 40 mg via INTRAVENOUS

## 2018-05-04 MED ORDER — PROPOFOL 500 MG/50ML IV EMUL
INTRAVENOUS | Status: DC | PRN
  Administered 2018-05-04: 140 ug/kg/min via INTRAVENOUS

## 2018-05-04 NOTE — Anesthesia Postprocedure Evaluation (Signed)
Anesthesia Post Note  Patient: [name]  Procedure(s) Performed: ESOPHAGOGASTRODUODENOSCOPY (EGD) WITH PROPOFOL (N/A )  Patient location during evaluation: Endoscopy Anesthesia Type: General Level of consciousness: awake and alert and oriented Pain management: pain level controlled Vital Signs Assessment: post-procedure vital signs reviewed and stable Respiratory status: spontaneous breathing Cardiovascular status: blood pressure returned to baseline Anesthetic complications: no     Last Vitals:  Vitals:   05/04/18 0830  Pulse: 73  Resp: 16  Temp: (!) 35.9 C  SpO2: 99%    Last Pain:  Vitals:   05/04/18 0830  TempSrc: Tympanic  PainSc: 0-No pain                 [name]

## 2018-05-04 NOTE — Op Note (Signed)
[facility] Gastroenterology Patient Name: [name] Procedure Date: 05/04/2018 8:58 AM MRN: [id number] Account #: [id number] Date of Birth: 09-Feb-1946 Admit Type: Outpatient Age: 72 Room: [facility] ENDO ROOM 2 Gender: Female Note Status: Finalized Procedure:            Upper GI endoscopy Indications:          Heartburn, Follow-up of Helicobacter pylori, Positive                        test for Helicobacter pylori Providers:            Gianelle Mccaul B. Bonna Gains MD, MD Referring MD:         Arnetha Courser (Referring MD) Medicines:            Monitored Anesthesia Care Complications:        No immediate complications. Procedure:            Pre-Anesthesia Assessment:                       - Prior to the procedure, a History and Physical was                        performed, and patient medications, allergies and                        sensitivities were reviewed. The patient's tolerance of                        previous anesthesia was reviewed.                       - The risks and benefits of the procedure and the                        sedation options and risks were discussed with the                        patient. All questions were answered and informed                        consent was obtained.                       - Patient identification and proposed procedure were                        verified prior to the procedure by the physician, the                        nurse, the anesthesiologist, the anesthetist and the                        technician. The procedure was verified in the procedure                        room.                       - ASA Grade Assessment: II - A patient with mild  systemic disease.                       After obtaining informed consent, the endoscope was                        passed under direct vision. Throughout the procedure,                        the patient's blood pressure, pulse, and oxygen                   saturations were monitored continuously. The Endoscope                        was introduced through the mouth, and advanced to the                        second part of duodenum. The upper GI endoscopy was                        accomplished with ease. The patient tolerated the                        procedure well. Findings:      Scattered islands of salmon-colored mucosa were present. No other       visible abnormalities were present. Mucosa was biopsied with a cold       forceps for histology in a targeted manner and in 4 quadrants only one       island present. Biopsies taken of the island, and Z line in 4 quadrants.      The entire examined stomach was normal. Biopsies were obtained in the       gastric body, at the incisura and in the gastric antrum with cold       forceps for histology. Biopsies were taken with a cold forceps for       Helicobacter pylori testing.      The duodenal bulb, second portion of the duodenum and examined duodenum       were normal. Impression:           - Salmon-colored mucosa suspicious for short-segment                        Barrett's esophagus. Biopsied.                       - Normal stomach. Biopsied.                       - Normal duodenal bulb, second portion of the duodenum                        and examined duodenum.                       - Biopsies were obtained in the gastric body, at the                        incisura and in the gastric antrum. Recommendation:       - Discharge patient to home (with escort).                       -  Advance diet as tolerated.                       - Continue present medications.                       - Patient has a contact number available for                        emergencies. The signs and symptoms of potential                        delayed complications were discussed with the patient.                        Return to normal activities tomorrow. Written discharge                         instructions were provided to the patient.                       - Discharge patient to home (with escort).                       - The findings and recommendations were discussed with                        the patient.                       - The findings and recommendations were discussed with                        the patient's family.                       - Follow an antireflux regimen. Procedure Code(s):    --- Professional ---                       [phone], Esophagogastroduodenoscopy, flexible, transoral;                        with biopsy, single or multiple Diagnosis Code(s):    --- Professional ---                       K22.8, Other specified diseases of esophagus                       R12, Heartburn                       I33.82, Helicobacter pylori [H. pylori] as the cause of                        diseases classified elsewhere CPT copyright 2018 American Medical Association. All rights reserved. The codes documented in this report are preliminary and upon coder review may  be revised to meet current compliance requirements.  Vonda Antigua, MD Margretta Sidle B. Bonna Gains MD, MD 05/04/2018 9:35:55 AM This report has been signed electronically. Number of Addenda: 0 Note Initiated On: 05/04/2018 8:58 AM Estimated Blood Loss: Estimated blood loss: none.      [facility]

## 2018-05-04 NOTE — Anesthesia Post-op Follow-up Note (Signed)
Anesthesia QCDR form completed.        

## 2018-05-04 NOTE — Anesthesia Preprocedure Evaluation (Addendum)
Anesthesia Evaluation  Patient identified by MRN, date of birth, ID band Patient awake    Reviewed: Allergy & Precautions, NPO status , Patient's Chart, lab work & pertinent test results  Airway Mallampati: III       Dental   Pulmonary neg pulmonary ROS,    Pulmonary exam normal        Cardiovascular hypertension, Normal cardiovascular exam     Neuro/Psych negative neurological ROS  negative psych ROS   GI/Hepatic Neg liver ROS,   Endo/Other  negative endocrine ROS  Renal/GU negative Renal ROS  negative genitourinary   Musculoskeletal  (+) Arthritis , Osteoarthritis,    Abdominal Normal abdominal exam  (+)   Peds negative pediatric ROS (+)  Hematology negative hematology ROS (+)   Anesthesia Other Findings   Reproductive/Obstetrics                             Anesthesia Physical Anesthesia Plan  ASA: II  Anesthesia Plan: General   Post-op Pain Management:    Induction: Intravenous  PONV Risk Score and Plan:   Airway Management Planned: Nasal Cannula  Additional Equipment:   Intra-op Plan:   Post-operative Plan:   Informed Consent: I have reviewed the patients History and Physical, chart, labs and discussed the procedure including the risks, benefits and alternatives for the proposed anesthesia with the patient or authorized representative who has indicated his/her understanding and acceptance.   Dental advisory given  Plan Discussed with: CRNA and Surgeon  Anesthesia Plan Comments:         Anesthesia Quick Evaluation

## 2018-05-04 NOTE — H&P (Signed)
Vonda Antigua, MD [address] [place], [place], [place], [address] [address], [place], [place], [place], [address] Phone: [phone]  Fax: [phone]  Primary Care Physician:  Arnetha Courser, MD   Pre-Procedure History & Physical: HPI:  [name] is a 72 y.o. female is here for an EGD.   Past Medical History:  Diagnosis Date  . Arthritis    in back  . Cataract 2011   remove from right eye  . Dyslipidemia, goal LDL below 130 10/28/2015  . Hormone replacement therapy (postmenopausal) 10/28/2015  . Hyperlipidemia   . Hypertension   . Osteopenia   . Patient is full code 12/05/2016  . TMJ (dislocation of temporomandibular joint)     Past Surgical History:  Procedure Laterality Date  .  MESH SLING X 15 YRS    . ANTERIOR AND POSTERIOR REPAIR    . AUGMENTATION MAMMAPLASTY Bilateral    20 yrs ago , then replaced 5 yrs ago  . BLADDER SUSPENSION    . BREAST ENHANCEMENT SURGERY    . BREAST SURGERY  2012   BILATERAL BREAST IMPLANT REMOVAL  . EYE SURGERY  20114   right eye  . INCONTINENCE SURGERY    . NASAL SINUS SURGERY    . NOSE SURGERY    . VAGINAL HYSTERECTOMY     still has ovaries    Prior to Admission medications   Medication Sig Start Date End Date Taking? Authorizing Provider  Alfalfa 250 MG TABS Take by mouth.    [provider]  calcium carbonate (OS-CAL) 600 MG TABS Take 600 mg by mouth as needed.     [provider]  celecoxib (CELEBREX) 200 MG capsule Take 200 mg by mouth daily.  08/12/13   [provider]  chloroquine (ARALEN) 250 MG tablet Take 2 tablets (500 mg total) by mouth once a week. 10/28/15   Arnetha Courser, MD  chlorthalidone (HYGROTON) 25 MG tablet Take 1 tablet (25 mg total) by mouth daily. 04/23/18   Poulose, Bethel Born, NP  cholecalciferol (VITAMIN D) 1000 UNITS tablet Take 1,000 Units by mouth daily.    [provider]  EST ESTROGENS-METHYLTEST DS 1.25-2.5 MG TABS Take 1 tablet by mouth  daily. 05/02/18   Lada, Satira Anis, MD  fish oil-omega-3 fatty acids 1000 MG capsule Take 2 g by mouth daily.    [provider]  gabapentin (NEURONTIN) 300 MG capsule Take 1 capsule (300 mg total) by mouth at bedtime. 03/09/17   Hubbard Hartshorn, FNP  Multiple Vitamin (MULTIVITAMIN) capsule Take 1 capsule by mouth daily.    [provider]  multivitamin-lutein (OCUVITE-LUTEIN) CAPS Take 1 capsule by mouth daily.    [provider]  pravastatin (PRAVACHOL) 20 MG tablet Take 1 tablet (20 mg total) by mouth daily. 04/06/18   Lada, Satira Anis, MD  tolterodine (DETROL LA) 4 MG 24 hr capsule Take 1 capsule (4 mg total) by mouth daily. 04/18/18   Arnetha Courser, MD  triamcinolone cream (KENALOG) 0.5 % Apply 1 application topically 3 (three) times daily. As needed 02/15/16   Lada, Satira Anis, MD  valACYclovir (VALTREX) 500 MG tablet Take 1 tablet (500 mg total) by mouth 2 (two) times daily. For three days in a row during outbreaks. 02/06/18   Arnetha Courser, MD    Allergies as of 04/18/2018 - Review Complete 04/17/2018  Allergen Reaction Noted  . Tizanidine Other (See Comments) 12/12/2017    Family History  Problem Relation  Age of Onset  . Cancer Mother        COLON AND UTERINE  . Cancer Father        PROSTATE  . Diabetes Father   . Hypertension Father   . Stroke Father   . Arthritis Sister   . Diabetes Son   . Breast cancer Neg Hx     Social History   Socioeconomic History  . Marital status: Married    Spouse name: Legrand Como  . Number of children: 1  . Years of education: Not on file  . Highest education level: Bachelor's degree (e.g., BA, AB, BS)  Occupational History  . Occupation: Retired  Scientific laboratory technician  . Financial resource strain: Not hard at all  . Food insecurity:    Worry: Never true    Inability: Never true  . Transportation needs:    Medical: No    Non-medical: No  Tobacco Use  . Smoking status: Never Smoker  . Smokeless tobacco: Never Used  .  Tobacco comment: smoking cessation materials not required  Substance and Sexual Activity  . Alcohol use: No  . Drug use: No  . Sexual activity: Yes    Partners: Male    Birth control/protection: Surgical  Lifestyle  . Physical activity:    Days per week: 3 days    Minutes per session: 60 min  . Stress: Not at all  Relationships  . Social connections:    Talks on phone: Patient refused    Gets together: Patient refused    Attends religious service: Patient refused    Active member of club or organization: Patient refused    Attends meetings of clubs or organizations: Patient refused    Relationship status: Married  . Intimate partner violence:    Fear of current or ex partner: No    Emotionally abused: No    Physically abused: No    Forced sexual activity: No  Other Topics Concern  . Not on file  Social History Narrative  . Not on file    Review of Systems: See HPI, otherwise negative ROS  Physical Exam: LMP  (LMP Unknown)  General:   Alert,  pleasant and cooperative in NAD Head:  Normocephalic and atraumatic. Neck:  Supple; no masses or thyromegaly. Lungs:  Clear throughout to auscultation, normal respiratory effort.    Heart:  +S1, +S2, Regular rate and rhythm, No edema. Abdomen:  Soft, nontender and nondistended. Normal bowel sounds, without guarding, and without rebound.   Neurologic:  Alert and  oriented x4;  grossly normal neurologically.  Impression/Plan: [name] is here for an EGD for H pylori and heartburn.  Risks, benefits, limitations, and alternatives regarding the procedure have been reviewed with the patient.  Questions have been answered.  All parties agreeable.   Virgel Manifold, MD  05/04/2018, 8:16 AM

## 2018-05-04 NOTE — Transfer of Care (Signed)
Immediate Anesthesia Transfer of Care Note  Patient: [name]  Procedure(s) Performed: ESOPHAGOGASTRODUODENOSCOPY (EGD) WITH PROPOFOL (N/A )  Patient Location: PACU and Endoscopy Unit  Anesthesia Type:General  Level of Consciousness: awake, alert  and oriented  Airway & Oxygen Therapy: Patient Spontanous Breathing  Post-op Assessment: Report given to RN and Post -op Vital signs reviewed and stable  Post vital signs: Reviewed and stable  Last Vitals:  Vitals Value Taken Time  BP 115/64   Temp 97   Pulse 65   Resp 18   SpO2 10097     Last Pain:  Vitals:   05/04/18 0830  TempSrc: Tympanic  PainSc: 0-No pain         Complications: No apparent anesthesia complications

## 2018-05-07 ENCOUNTER — Ambulatory Visit: Payer: PPO

## 2018-05-07 LAB — SURGICAL PATHOLOGY

## 2018-05-11 ENCOUNTER — Ambulatory Visit: Payer: PPO

## 2018-05-11 VITALS — BP 122/74 | HR 76

## 2018-05-11 DIAGNOSIS — I10 Essential (primary) hypertension: Secondary | ICD-10-CM

## 2018-05-11 NOTE — Progress Notes (Signed)
Pt is here for BP check. Chlorthalidone was started 04/23/18 by Benjamine Mola. Pt denies any side effects or symptoms. BP today is 122/74 and HR was 76. Benjamine Mola, NP notified.

## 2018-05-14 ENCOUNTER — Telehealth: Payer: Self-pay

## 2018-05-14 MED ORDER — METRONIDAZOLE 250 MG PO TABS: 250.0000 mg | ORAL_TABLET | Freq: Four times a day (QID) | ORAL | 0 refills | Status: AC

## 2018-05-14 MED ORDER — BISMUTH SUBSALICYLATE 262 MG PO TABS: 2.0000 | ORAL_TABLET | Freq: Four times a day (QID) | ORAL | 0 refills | Status: AC

## 2018-05-14 MED ORDER — PANTOPRAZOLE SODIUM 20 MG PO TBEC: 20.0000 mg | DELAYED_RELEASE_TABLET | Freq: Two times a day (BID) | ORAL | 0 refills | Status: DC

## 2018-05-14 MED ORDER — DOXYCYCLINE HYCLATE 100 MG PO TABS: 100.0000 mg | ORAL_TABLET | Freq: Two times a day (BID) | ORAL | 0 refills | Status: DC

## 2018-05-14 NOTE — Telephone Encounter (Signed)
LMTCO.  Left message that prescriptions have been sent to pharmacy, to please contact office for further information. Unable to complete call to home number.

## 2018-05-14 NOTE — Telephone Encounter (Signed)
-----   Message from Virgel Manifold, MD sent at 05/14/2018  3:01 PM EST ----- Sahej Schrieber please let patient know, her biopsies were positive for H Pylori. Can you please send the quadruple therapy medications to her pharmacy (you have the list of these 4 meds, if not let me know)

## 2018-05-24 ENCOUNTER — Encounter: Payer: Self-pay | Admitting: Family Medicine

## 2018-05-24 ENCOUNTER — Ambulatory Visit (INDEPENDENT_AMBULATORY_CARE_PROVIDER_SITE_OTHER): Payer: PPO | Admitting: Family Medicine

## 2018-05-24 VITALS — BP 122/76 | HR 90 | Temp 98.2°F | Ht 64.0 in | Wt 135.0 lb

## 2018-05-24 DIAGNOSIS — I10 Essential (primary) hypertension: Secondary | ICD-10-CM | POA: Insufficient documentation

## 2018-05-24 DIAGNOSIS — R350 Frequency of micturition: Secondary | ICD-10-CM | POA: Diagnosis not present

## 2018-05-24 DIAGNOSIS — E785 Hyperlipidemia, unspecified: Secondary | ICD-10-CM

## 2018-05-24 DIAGNOSIS — M8580 Other specified disorders of bone density and structure, unspecified site: Secondary | ICD-10-CM | POA: Diagnosis not present

## 2018-05-24 DIAGNOSIS — A048 Other specified bacterial intestinal infections: Secondary | ICD-10-CM | POA: Diagnosis not present

## 2018-05-24 LAB — POCT URINALYSIS DIPSTICK
Bilirubin, UA: NEGATIVE
Blood, UA: NEGATIVE
GLUCOSE UA: NEGATIVE
KETONES UA: NEGATIVE
Leukocytes, UA: NEGATIVE
Nitrite, UA: NEGATIVE
Protein, UA: NEGATIVE
Spec Grav, UA: 1.015 (ref 1.010–1.025)
Urobilinogen, UA: NEGATIVE E.U./dL — AB
pH, UA: 5 (ref 5.0–8.0)

## 2018-05-24 MED ORDER — CHLORTHALIDONE 25 MG PO TABS: 12.5000 mg | ORAL_TABLET | Freq: Every day | ORAL | 5 refills | Status: DC

## 2018-05-24 NOTE — Assessment & Plan Note (Signed)
Reviewed the results of the DEXA since she was not previous informed; apologized; calcium and vit D and weight bearing exercises; next DEXA in Dec 2021

## 2018-05-24 NOTE — Assessment & Plan Note (Addendum)
Controlled on the thiazide diuretic; we decided to decrease the diuretic on the trip by 50%, monitor BP to make sure not going up just for symptoms for ease of traveling; DASH guidelines urged

## 2018-05-24 NOTE — Patient Instructions (Addendum)
Try to get 1200 mg total of calcium daily, best in foods but also supplement if needed to reach daily total Take 1,000 iu of vitamin D3 daily if not in the sun  Try to follow the DASH guidelines (DASH stands for Dietary Approaches to Stop Hypertension). Try to limit the sodium in your diet to no more than 1,500mg  of sodium per day. Certainly try to not exceed 2,000 mg per day at the very most. Do not add salt when cooking or at the table.  Check the sodium amount on labels when shopping, and choose items lower in sodium when given a choice. Avoid or limit foods that already contain a lot of sodium. Eat a diet rich in fruits and vegetables and whole grains, and try to lose weight if overweight or obese  Try oat milk

## 2018-05-24 NOTE — Progress Notes (Signed)
[name], please let the patient know that her urine test was unremarkable, no evidence of infection. Her SG was fine. Thank you

## 2018-05-24 NOTE — Assessment & Plan Note (Signed)
Patient had positive H pylori on EGD biopsies; will start quad therapy soon; no red flags like abd pain or blood in stool

## 2018-05-24 NOTE — Assessment & Plan Note (Signed)
She is not taking the statin right now; work on diet, limit foods from cows and pigs; recheck lipids in 3 months

## 2018-05-24 NOTE — Progress Notes (Signed)
BP 122/76   Pulse 90   Temp 98.2 F (36.8 C) (Oral)   Ht 5\' 4"  (1.626 m)   Wt 135 lb (61.2 kg)   LMP  (LMP Unknown)   SpO2 99%   BMI 23.17 kg/m    Subjective:    Patient ID: [name], female    DOB: Sep 22, 1945, 73 y.o.   MRN: [id number]  HPI: [name] is a 73 y.o. female  Chief Complaint  Patient presents with  . Follow-up    HPI Here for follow-up but lots of things  H pylori, treated and seeing Dr. Bonna Gains; she did the EGD and did not have any heartburn Dec 20th was the EGD, biopsies taken There is a note that they tried to reach her Dec 30th and the message was rambling; she was told to take the medicines; did not start taking the medicines yet, going to [place] next week and it was going to be in the middle of her traveling; no blood in the stool  Reviewed path report from 05/04/2018:  DIAGNOSIS:  A. STOMACH; COLD BIOPSY:  - HELICOBACTER PYLORI-ASSOCIATED GASTRITIS WITH MILD CHRONIC ACTIVE  INFLAMMATION, INVOLVING ANTRAL AND OXYNTIC MUCOSA.  - H. PYLORI BACTERIA ARE IDENTIFIED IN ROUTINE SECTIONS.  - NEGATIVE FOR ATROPHY, INTESTINAL METAPLASIA, DYSPLASIA, AND  MALIGNANCY.   B. GASTROESOPHAGEAL JUNCTION; COLD BIOPSY:  - SQUAMOCOLUMNAR MUCOSA WITH MILD CHRONIC ACTIVE INFLAMMATION AND  MULTILAYERED EPITHELIUM, CONSISTENT WITH REFLUX ESOPHAGITIS.  - NEGATIVE FOR GOBLET CELLS, DYSPLASIA, AND MALIGNANCY.    She was going over her medicines prior to EGD; she found out that she has osteopenia but did not know it She drinks cow's milk a lot; likes kale; started taking Ca2+ supplements  Blood pressure was up high at the GI; 165/89 at the GI office, then checked BP at [facility], still 160 here on Apr 23, 2018 with EP; no increased stress, no NSAIDs; no decongestants; on chlorthalidone and recheck BP 122/74; pulse 76; normal TSH but down near lower end of normal;  More urinary urgency  High cholesterol; she thinks she took the pravastatin for one  month but just did not refill; olive leaf extract for cholesterol, oatmeal, with muesli; not overweight; some exercise  Lab Results  Component Value Date   CHOL 182 04/05/2018   HDL 53 04/05/2018   LDLCALC 116 (H) 04/05/2018   TRIG 43 04/05/2018   CHOLHDL 3.4 04/05/2018     Depression screen PHQ 2/9 05/24/2018 02/06/2018 01/02/2018 12/28/2017 12/12/2017  Decreased Interest 0 0 0 0 0  Down, Depressed, Hopeless 0 0 0 0 0  PHQ - 2 Score 0 0 0 0 0  Altered sleeping 0 0 0 0 -  Tired, decreased energy 0 0 0 0 -  Change in appetite 0 0 0 0 -  Feeling bad or failure about yourself  0 0 0 0 -  Trouble concentrating 0 0 0 0 -  Moving slowly or fidgety/restless 0 0 0 0 -  Suicidal thoughts 0 0 0 0 -  PHQ-9 Score 0 0 0 0 -  Difficult doing work/chores Not difficult at all Not difficult at all Not difficult at all Not difficult at all -   Fall Risk  05/24/2018 04/23/2018 02/06/2018 01/29/2018 01/29/2018  Falls in the past year? 0 0 No No No  Number falls in past yr: 0 - - - -  Injury with Fall? 0 - - - -    Relevant past medical, surgical, family and  social history reviewed Past Medical History:  Diagnosis Date  . Arthritis    in back  . Cataract 2011   remove from right eye  . Dyslipidemia, goal LDL below 130 10/28/2015  . Hormone replacement therapy (postmenopausal) 10/28/2015  . Hyperlipidemia   . Hypertension   . Osteopenia   . Patient is full code 12/05/2016  . TMJ (dislocation of temporomandibular joint)    Past Surgical History:  Procedure Laterality Date  .  MESH SLING X 15 YRS    . ANTERIOR AND POSTERIOR REPAIR    . AUGMENTATION MAMMAPLASTY Bilateral    20 yrs ago , then replaced 5 yrs ago  . BLADDER SUSPENSION    . BREAST ENHANCEMENT SURGERY    . BREAST SURGERY  2012   BILATERAL BREAST IMPLANT REMOVAL  . ESOPHAGOGASTRODUODENOSCOPY (EGD) WITH PROPOFOL N/A 05/04/2018   Procedure: ESOPHAGOGASTRODUODENOSCOPY (EGD) WITH PROPOFOL;  Surgeon: Virgel Manifold, MD;  Location: [facility]  ENDOSCOPY;  Service: Endoscopy;  Laterality: N/A;  . EYE SURGERY  20114   right eye  . INCONTINENCE SURGERY    . NASAL SINUS SURGERY    . NOSE SURGERY    . VAGINAL HYSTERECTOMY     still has ovaries   Family History  Problem Relation Age of Onset  . Cancer Mother        COLON AND UTERINE  . Cancer Father        PROSTATE  . Diabetes Father   . Hypertension Father   . Stroke Father   . Arthritis Sister   . Diabetes Son   . Breast cancer Neg Hx    Social History   Tobacco Use  . Smoking status: Never Smoker  . Smokeless tobacco: Never Used  . Tobacco comment: smoking cessation materials not required  Substance Use Topics  . Alcohol use: No  . Drug use: No     Office Visit from 05/24/2018 in [facility]  AUDIT-C Score  0      Interim medical history since last visit reviewed. Allergies and medications reviewed  Review of Systems Per HPI unless specifically indicated above     Objective:    BP 122/76   Pulse 90   Temp 98.2 F (36.8 C) (Oral)   Ht 5\' 4"  (1.626 m)   Wt 135 lb (61.2 kg)   LMP  (LMP Unknown)   SpO2 99%   BMI 23.17 kg/m   Wt Readings from Last 3 Encounters:  05/24/18 135 lb (61.2 kg)  05/04/18 135 lb (61.2 kg)  04/23/18 136 lb 8 oz (61.9 kg)    Physical Exam Constitutional:      General: She is not in acute distress.    Appearance: She is well-developed.  Eyes:     General: No scleral icterus. Neck:     Thyroid: No thyromegaly.  Cardiovascular:     Rate and Rhythm: Normal rate.  Pulmonary:     Effort: Pulmonary effort is normal.  Abdominal:     General: There is no distension.  Musculoskeletal:     Thoracic back: She exhibits no deformity (no thoracic kyphosis).  Skin:    Coloration: Skin is not pale.  Psychiatric:        Behavior: Behavior normal.        Thought Content: Thought content normal.        Judgment: Judgment normal.     Results for orders placed or performed in visit on 05/24/18  POCT  urinalysis dipstick  Result  Value Ref Range   Color, UA Yellow    Clarity, UA Clear    Glucose, UA Negative Negative   Bilirubin, UA Negative    Ketones, UA Negative    Spec Grav, UA 1.015 1.010 - 1.025   Blood, UA Negative    pH, UA 5.0 5.0 - 8.0   Protein, UA Negative Negative   Urobilinogen, UA negative (A) 0.2 or 1.0 E.U./dL   Nitrite, UA Negative    Leukocytes, UA Negative Negative   Appearance     Odor        Assessment & Plan:   Problem List Items Addressed This Visit      Cardiovascular and Mediastinum   Essential hypertension, benign    Controlled on the thiazide diuretic; we decided to decrease the diuretic on the trip by 50%, monitor BP to make sure not going up just for symptoms for ease of traveling; DASH guidelines urged      Relevant Medications   chlorthalidone (HYGROTON) 25 MG tablet     Musculoskeletal and Integument   Osteopenia    Reviewed the results of the DEXA since she was not previous informed; apologized; calcium and vit D and weight bearing exercises; next DEXA in Dec 2021        Other   H. pylori infection    Patient had positive H pylori on EGD biopsies; will start quad therapy soon; no red flags like abd pain or blood in stool      Dyslipidemia, goal LDL below 100    She is not taking the statin right now; work on diet, limit foods from cows and pigs; recheck lipids in 3 months      Relevant Medications   chlorthalidone (HYGROTON) 25 MG tablet    Other Visit Diagnoses    Urinary frequency    -  Primary   Relevant Orders   POCT urinalysis dipstick (Completed)   Hypertension, unspecified type       Relevant Medications   chlorthalidone (HYGROTON) 25 MG tablet       Follow up plan: Return in about 3 months (around 08/23/2018) for follow-up visit with Dr. Sanda Klein.  An after-visit summary was printed and given to the patient at [facility].  Please see the patient instructions which may contain other information and recommendations beyond  what is mentioned above in the assessment and plan.  Meds ordered this encounter  Medications  . chlorthalidone (HYGROTON) 25 MG tablet    Sig: Take 0.5-1 tablets (12.5-25 mg total) by mouth daily.    Dispense:  30 tablet    Refill:  5    Orders Placed This Encounter  Procedures  . POCT urinalysis dipstick

## 2018-06-21 DIAGNOSIS — X32XXXA Exposure to sunlight, initial encounter: Secondary | ICD-10-CM | POA: Diagnosis not present

## 2018-06-21 DIAGNOSIS — L570 Actinic keratosis: Secondary | ICD-10-CM | POA: Diagnosis not present

## 2018-07-02 DIAGNOSIS — H35039 Hypertensive retinopathy, unspecified eye: Secondary | ICD-10-CM | POA: Diagnosis not present

## 2018-07-02 DIAGNOSIS — H04129 Dry eye syndrome of unspecified lacrimal gland: Secondary | ICD-10-CM | POA: Diagnosis not present

## 2018-07-02 DIAGNOSIS — H26491 Other secondary cataract, right eye: Secondary | ICD-10-CM | POA: Diagnosis not present

## 2018-07-02 DIAGNOSIS — H401131 Primary open-angle glaucoma, bilateral, mild stage: Secondary | ICD-10-CM | POA: Diagnosis not present

## 2018-07-02 DIAGNOSIS — H1045 Other chronic allergic conjunctivitis: Secondary | ICD-10-CM | POA: Diagnosis not present

## 2018-07-02 DIAGNOSIS — Z961 Presence of intraocular lens: Secondary | ICD-10-CM | POA: Diagnosis not present

## 2018-07-18 DIAGNOSIS — L565 Disseminated superficial actinic porokeratosis (DSAP): Secondary | ICD-10-CM | POA: Diagnosis not present

## 2018-07-18 DIAGNOSIS — B079 Viral wart, unspecified: Secondary | ICD-10-CM | POA: Diagnosis not present

## 2018-07-18 DIAGNOSIS — D485 Neoplasm of uncertain behavior of skin: Secondary | ICD-10-CM | POA: Diagnosis not present

## 2018-07-18 DIAGNOSIS — L538 Other specified erythematous conditions: Secondary | ICD-10-CM | POA: Diagnosis not present

## 2018-07-18 DIAGNOSIS — Z85828 Personal history of other malignant neoplasm of skin: Secondary | ICD-10-CM | POA: Diagnosis not present

## 2018-07-18 DIAGNOSIS — D2262 Melanocytic nevi of left upper limb, including shoulder: Secondary | ICD-10-CM | POA: Diagnosis not present

## 2018-07-18 DIAGNOSIS — D2261 Melanocytic nevi of right upper limb, including shoulder: Secondary | ICD-10-CM | POA: Diagnosis not present

## 2018-07-18 DIAGNOSIS — X32XXXA Exposure to sunlight, initial encounter: Secondary | ICD-10-CM | POA: Diagnosis not present

## 2018-07-18 DIAGNOSIS — D225 Melanocytic nevi of trunk: Secondary | ICD-10-CM | POA: Diagnosis not present

## 2018-07-18 DIAGNOSIS — Z08 Encounter for follow-up examination after completed treatment for malignant neoplasm: Secondary | ICD-10-CM | POA: Diagnosis not present

## 2018-07-18 DIAGNOSIS — L570 Actinic keratosis: Secondary | ICD-10-CM | POA: Diagnosis not present

## 2018-07-25 ENCOUNTER — Telehealth: Payer: Self-pay | Admitting: Family Medicine

## 2018-07-25 NOTE — Telephone Encounter (Signed)
Request came through from pharmacy for TID dosing of gabapentin DENIED She only takes one at bedtime per [name] in October 2018; no prescriptions since then so maybe it's not coming from [place] any longer (??)  Please contact patient and get more information Okay for [name] to refill 300 mg at bedtime while I'm away if that's what she is taking Otherwise, a fill history from the pharmacy may help elucidate who's been prescribing and how much she's actually been taking

## 2018-07-26 MED ORDER — GABAPENTIN 300 MG PO CAPS: 300.0000 mg | ORAL_CAPSULE | Freq: Every day | ORAL | 2 refills | Status: DC

## 2018-07-26 NOTE — Telephone Encounter (Signed)
Pt states is taking, and we prescribe.  Yes only taking 1 per day

## 2018-07-31 NOTE — Telephone Encounter (Signed)
Called in verbally per fill from [place] on 3/12, pharmacy never received. Pharmacist advised she would fill it, patient notified.

## 2018-07-31 NOTE — Telephone Encounter (Signed)
Patient states that pharmacy does not have the script for Gabapentin yet on file. Please Advise. [facility] in [place].

## 2018-08-01 ENCOUNTER — Ambulatory Visit (INDEPENDENT_AMBULATORY_CARE_PROVIDER_SITE_OTHER): Payer: PPO | Admitting: Gastroenterology

## 2018-08-01 ENCOUNTER — Other Ambulatory Visit: Payer: Self-pay

## 2018-08-01 VITALS — BP 111/77 | HR 77 | Ht 64.0 in | Wt 135.4 lb

## 2018-08-01 DIAGNOSIS — A048 Other specified bacterial intestinal infections: Secondary | ICD-10-CM

## 2018-08-01 NOTE — Progress Notes (Signed)
Vonda Antigua, MD [address]  [place]  [place], [place] [address]  Main: [phone]  Fax: [phone]   Primary Care Physician: Arnetha Courser, MD   Chief Complaint  Patient presents with  . New Patient (Initial Visit)    follow up H-Pylori, heartburn is present still    HPI: [name] is a 73 y.o. female here for follow-up of H. pylori.  Patient underwent EGD with biopsy showing H. pylori and patient is status post quadruple therapy.  Denies any abdominal pain, nausea or vomiting, no dyspepsia.  However reports heartburn 3 times a week at least and has to take Tums which helps.  EGD in December 2019 showed salmon-colored mucosa suspicious for short segment Barrett's esophagus which was biopsied.  Study was otherwise normal.  Biopsy showed squamocolumnar mucosa with mild chronic active inflammation and multilayered epithelium consistent with reflux esophagitis.  Negative for goblet cells.  Last colonoscopy was in July 2015 for high risk screening due to colon cancer in her mother at less than 40 years old.  One 5 mm polyp removed, diverticulosis and hemorrhoids seen.  Pathology report from the polyp removal is not available to me.  Repeat is recommended in 5 years.  Current Outpatient Medications  Medication Sig Dispense Refill  . Alfalfa 250 MG TABS Take by mouth.    . calcium carbonate (OS-CAL) 600 MG TABS Take 600 mg by mouth as needed.     . celecoxib (CELEBREX) 200 MG capsule Take 200 mg by mouth daily.     . chloroquine (ARALEN) 250 MG tablet Take 2 tablets (500 mg total) by mouth once a week. 30 tablet 0  . chlorthalidone (HYGROTON) 25 MG tablet Take 0.5-1 tablets (12.5-25 mg total) by mouth daily. 30 tablet 5  . cholecalciferol (VITAMIN D) 1000 UNITS tablet Take 1,000 Units by mouth daily.    Marland Kitchen EST ESTROGENS-METHYLTEST DS 1.25-2.5 MG TABS Take 1 tablet by mouth daily. 90 each 2  . fish oil-omega-3 fatty acids 1000 MG capsule Take 2 g by mouth daily.     Marland Kitchen gabapentin (NEURONTIN) 300 MG capsule Take 1 capsule (300 mg total) by mouth at bedtime. 30 capsule 2  . Multiple Vitamin (MULTIVITAMIN) capsule Take 1 capsule by mouth daily.    Marland Kitchen OLIVE LEAF EXTRACT PO Take 1 tablet by mouth daily.    . pravastatin (PRAVACHOL) 20 MG tablet Take 1 tablet (20 mg total) by mouth daily. 30 tablet 1  . tolterodine (DETROL LA) 4 MG 24 hr capsule Take 1 capsule (4 mg total) by mouth daily. 90 capsule 1  . triamcinolone cream (KENALOG) 0.5 % Apply 1 application topically 3 (three) times daily. As needed 30 g 0  . valACYclovir (VALTREX) 500 MG tablet Take 1 tablet (500 mg total) by mouth 2 (two) times daily. For three days in a row during outbreaks. 20 tablet 0  . doxycycline (VIBRA-TABS) 100 MG tablet Take 1 tablet (100 mg total) by mouth 2 (two) times daily. (Patient not taking: Reported on 08/01/2018) 28 tablet 0  . multivitamin-lutein (OCUVITE-LUTEIN) CAPS Take 1 capsule by mouth daily.    . pantoprazole (PROTONIX) 20 MG tablet Take 1 tablet (20 mg total) by mouth 2 (two) times daily for 14 days. 28 tablet 0   No current facility-administered medications for this visit.     Allergies as of 08/01/2018 - Review Complete 08/01/2018  Allergen Reaction Noted  . Tizanidine Other (See Comments) 12/12/2017    ROS:  General: Negative for anorexia, weight loss, fever, chills, fatigue, weakness. ENT: Negative for hoarseness, difficulty swallowing , nasal congestion. CV: Negative for chest pain, angina, palpitations, dyspnea on exertion, peripheral edema.  Respiratory: Negative for dyspnea at rest, dyspnea on exertion, cough, sputum, wheezing.  GI: See history of present illness. GU:  Negative for dysuria, hematuria, urinary incontinence, urinary frequency, nocturnal urination.  Endo: Negative for unusual weight change.    Physical Examination:   BP 111/77   Pulse 77   Ht 5\' 4"  (1.626 m)   Wt 135 lb 6.4 oz (61.4 kg)   LMP  (LMP Unknown)   BMI 23.24 kg/m    General: Well-nourished, well-developed in no acute distress.  Eyes: No icterus. Conjunctivae pink. Mouth: Oropharyngeal mucosa moist and pink , no lesions erythema or exudate. Neck: Supple, Trachea midline Abdomen: Bowel sounds are normal, nontender, nondistended, no hepatosplenomegaly or masses, no abdominal bruits or hernia , no rebound or guarding.   Extremities: No lower extremity edema. No clubbing or deformities. Neuro: Alert and oriented x 3.  Grossly intact. Skin: Warm and dry, no jaundice.   Psych: Alert and cooperative, normal mood and affect.   Labs: CMP     Component Value Date/Time   NA 140 04/23/2018 1043   NA 141 10/29/2015 0805   K 4.6 04/23/2018 1043   CL 104 04/23/2018 1043   CO2 31 04/23/2018 1043   GLUCOSE 71 04/23/2018 1043   BUN 21 04/23/2018 1043   BUN 19 10/29/2015 0805   CREATININE 0.68 04/23/2018 1043   CALCIUM 9.4 04/23/2018 1043   PROT 6.1 04/05/2018 0836   PROT 6.2 10/27/2014 0940   ALBUMIN 3.9 12/09/2016 0823   ALBUMIN 4.1 10/27/2014 0940   AST 22 04/05/2018 0836   ALT 12 04/05/2018 0836   ALKPHOS 49 12/09/2016 0823   BILITOT 0.4 04/05/2018 0836   BILITOT 0.3 10/27/2014 0940   GFRNONAA 82 02/06/2018 1003   GFRAA 94 02/06/2018 1003   Lab Results  Component Value Date   WBC 8.9 02/06/2018   HGB 12.4 02/06/2018   HCT 36.3 02/06/2018   MCV 93.1 02/06/2018   PLT 316 02/06/2018    Imaging Studies: No results found.  Assessment and Plan:   [name] is a 73 y.o. y/o female here for follow-up of H. pylori  Patient is status post quadruple therapy in December 2019 We will order H. pylori breath test for eradication testing at this time  The biopsies from her esophagus does not show Barrett's, however this can be due to sampling error.  Given her heartburn occurring 3 times a week at least, will start on H2 RA to prevent worsening of possible underlying Barrett's and plan on repeating EGD in 3 years to reassess the area.  Patient  educated extensively on acid reflux lifestyle modification, including buying a bed wedge, not eating 3 hrs before bedtime, diet modifications, and handout given for the same.   Patient due for screening colonoscopy in July 2020 due to family history of colon cancer  Dr Vonda Antigua

## 2018-08-02 ENCOUNTER — Other Ambulatory Visit: Payer: Self-pay

## 2018-08-02 LAB — H. PYLORI BREATH TEST: H pylori Breath Test: NEGATIVE

## 2018-08-02 MED ORDER — GABAPENTIN 300 MG PO CAPS: 300.0000 mg | ORAL_CAPSULE | Freq: Every day | ORAL | 1 refills | Status: DC

## 2018-08-02 MED ORDER — OMEPRAZOLE 20 MG PO CPDR: 20.0000 mg | DELAYED_RELEASE_CAPSULE | Freq: Every day | ORAL | 1 refills | Status: DC

## 2018-08-02 NOTE — Telephone Encounter (Signed)
I did not send in the 30 d supply I'll be glad to send 90 d supply

## 2018-08-02 NOTE — Telephone Encounter (Signed)
Copied from [place] [id number]. Topic: General - Other >> Aug 01, 2018  4:10 PM Wynetta Emery, [place] C wrote: Reason for CRM: pt is requesting a 90 day supply of gabapentin (NEURONTIN) 300 MG capsule. Pt received a 30 day supply instead.    Please assist.

## 2018-08-03 ENCOUNTER — Telehealth: Payer: Self-pay

## 2018-08-03 NOTE — Telephone Encounter (Signed)
-----   Message from Virgel Manifold, MD sent at 08/03/2018 10:16 AM EDT ----- [name] please let patient know, her breath test shows that she has cleared the bacteria

## 2018-08-03 NOTE — Telephone Encounter (Signed)
Unable to reach pt at either number. Letter sent.

## 2018-08-06 ENCOUNTER — Telehealth: Payer: Self-pay | Admitting: Gastroenterology

## 2018-08-06 NOTE — Telephone Encounter (Signed)
Pt left vm  She had a breath test last week and needs the results she is holding off on taking new rx until she finds out if she still has bacteria  Please call pt

## 2018-08-07 NOTE — Telephone Encounter (Signed)
Pt notified of results. H pylori has resolved.

## 2018-08-16 ENCOUNTER — Encounter: Payer: Self-pay | Admitting: Emergency Medicine

## 2018-08-16 ENCOUNTER — Emergency Department: Payer: PPO

## 2018-08-16 ENCOUNTER — Other Ambulatory Visit: Payer: Self-pay

## 2018-08-16 ENCOUNTER — Inpatient Hospital Stay: Payer: PPO

## 2018-08-16 ENCOUNTER — Inpatient Hospital Stay
Admission: EM | Admit: 2018-08-16 | Discharge: 2018-08-19 | DRG: 871 | Disposition: A | Discharge status: Home / Self Care | Payer: PPO | Attending: Specialist | Admitting: Specialist

## 2018-08-16 ENCOUNTER — Telehealth: Payer: Self-pay | Admitting: Family Medicine

## 2018-08-16 ENCOUNTER — Ambulatory Visit: Payer: Self-pay

## 2018-08-16 DIAGNOSIS — M8580 Other specified disorders of bone density and structure, unspecified site: Secondary | ICD-10-CM | POA: Diagnosis present

## 2018-08-16 DIAGNOSIS — R32 Unspecified urinary incontinence: Secondary | ICD-10-CM | POA: Diagnosis not present

## 2018-08-16 DIAGNOSIS — I739 Peripheral vascular disease, unspecified: Secondary | ICD-10-CM | POA: Diagnosis not present

## 2018-08-16 DIAGNOSIS — Z8042 Family history of malignant neoplasm of prostate: Secondary | ICD-10-CM

## 2018-08-16 DIAGNOSIS — D72829 Elevated white blood cell count, unspecified: Secondary | ICD-10-CM | POA: Diagnosis not present

## 2018-08-16 DIAGNOSIS — Z1231 Encounter for screening mammogram for malignant neoplasm of breast: Secondary | ICD-10-CM | POA: Diagnosis not present

## 2018-08-16 DIAGNOSIS — A419 Sepsis, unspecified organism: Secondary | ICD-10-CM | POA: Diagnosis present

## 2018-08-16 DIAGNOSIS — R05 Cough: Secondary | ICD-10-CM | POA: Diagnosis present

## 2018-08-16 DIAGNOSIS — K219 Gastro-esophageal reflux disease without esophagitis: Secondary | ICD-10-CM | POA: Diagnosis present

## 2018-08-16 DIAGNOSIS — G629 Polyneuropathy, unspecified: Secondary | ICD-10-CM | POA: Diagnosis not present

## 2018-08-16 DIAGNOSIS — I248 Other forms of acute ischemic heart disease: Secondary | ICD-10-CM | POA: Diagnosis not present

## 2018-08-16 DIAGNOSIS — Z01812 Encounter for preprocedural laboratory examination: Secondary | ICD-10-CM | POA: Diagnosis not present

## 2018-08-16 DIAGNOSIS — Z888 Allergy status to other drugs, medicaments and biological substances status: Secondary | ICD-10-CM | POA: Diagnosis not present

## 2018-08-16 DIAGNOSIS — F209 Schizophrenia, unspecified: Secondary | ICD-10-CM | POA: Diagnosis not present

## 2018-08-16 DIAGNOSIS — J9601 Acute respiratory failure with hypoxia: Secondary | ICD-10-CM | POA: Diagnosis present

## 2018-08-16 DIAGNOSIS — Z800 Family history of malignant neoplasm of digestive organs: Secondary | ICD-10-CM

## 2018-08-16 DIAGNOSIS — Z209 Contact with and (suspected) exposure to unspecified communicable disease: Secondary | ICD-10-CM | POA: Diagnosis not present

## 2018-08-16 DIAGNOSIS — R5383 Other fatigue: Secondary | ICD-10-CM | POA: Diagnosis not present

## 2018-08-16 DIAGNOSIS — Z8249 Family history of ischemic heart disease and other diseases of the circulatory system: Secondary | ICD-10-CM

## 2018-08-16 DIAGNOSIS — E876 Hypokalemia: Secondary | ICD-10-CM | POA: Diagnosis present

## 2018-08-16 DIAGNOSIS — B349 Viral infection, unspecified: Secondary | ICD-10-CM | POA: Diagnosis not present

## 2018-08-16 DIAGNOSIS — Z79890 Hormone replacement therapy (postmenopausal): Secondary | ICD-10-CM | POA: Diagnosis not present

## 2018-08-16 DIAGNOSIS — M1990 Unspecified osteoarthritis, unspecified site: Secondary | ICD-10-CM | POA: Diagnosis present

## 2018-08-16 DIAGNOSIS — A4151 Sepsis due to Escherichia coli [E. coli]: Principal | ICD-10-CM | POA: Diagnosis present

## 2018-08-16 DIAGNOSIS — R0602 Shortness of breath: Secondary | ICD-10-CM

## 2018-08-16 DIAGNOSIS — N179 Acute kidney failure, unspecified: Secondary | ICD-10-CM | POA: Diagnosis not present

## 2018-08-16 DIAGNOSIS — Z833 Family history of diabetes mellitus: Secondary | ICD-10-CM

## 2018-08-16 DIAGNOSIS — E785 Hyperlipidemia, unspecified: Secondary | ICD-10-CM | POA: Diagnosis not present

## 2018-08-16 DIAGNOSIS — Z79899 Other long term (current) drug therapy: Secondary | ICD-10-CM | POA: Diagnosis not present

## 2018-08-16 DIAGNOSIS — Z9010 Acquired absence of unspecified breast and nipple: Secondary | ICD-10-CM | POA: Diagnosis not present

## 2018-08-16 DIAGNOSIS — H20022 Recurrent acute iridocyclitis, left eye: Secondary | ICD-10-CM | POA: Diagnosis not present

## 2018-08-16 DIAGNOSIS — B9620 Unspecified Escherichia coli [E. coli] as the cause of diseases classified elsewhere: Secondary | ICD-10-CM | POA: Diagnosis not present

## 2018-08-16 DIAGNOSIS — Z823 Family history of stroke: Secondary | ICD-10-CM

## 2018-08-16 DIAGNOSIS — D72810 Lymphocytopenia: Secondary | ICD-10-CM | POA: Diagnosis present

## 2018-08-16 DIAGNOSIS — R509 Fever, unspecified: Secondary | ICD-10-CM | POA: Diagnosis not present

## 2018-08-16 DIAGNOSIS — I10 Essential (primary) hypertension: Secondary | ICD-10-CM | POA: Diagnosis not present

## 2018-08-16 DIAGNOSIS — R6520 Severe sepsis without septic shock: Secondary | ICD-10-CM | POA: Diagnosis present

## 2018-08-16 DIAGNOSIS — R0902 Hypoxemia: Secondary | ICD-10-CM | POA: Diagnosis not present

## 2018-08-16 DIAGNOSIS — R7989 Other specified abnormal findings of blood chemistry: Secondary | ICD-10-CM | POA: Diagnosis not present

## 2018-08-16 DIAGNOSIS — R82998 Other abnormal findings in urine: Secondary | ICD-10-CM | POA: Diagnosis not present

## 2018-08-16 DIAGNOSIS — R809 Proteinuria, unspecified: Secondary | ICD-10-CM | POA: Diagnosis not present

## 2018-08-16 DIAGNOSIS — M5416 Radiculopathy, lumbar region: Secondary | ICD-10-CM | POA: Diagnosis not present

## 2018-08-16 DIAGNOSIS — Z8261 Family history of arthritis: Secondary | ICD-10-CM

## 2018-08-16 DIAGNOSIS — Z20828 Contact with and (suspected) exposure to other viral communicable diseases: Secondary | ICD-10-CM | POA: Diagnosis present

## 2018-08-16 DIAGNOSIS — M5430 Sciatica, unspecified side: Secondary | ICD-10-CM | POA: Diagnosis not present

## 2018-08-16 DIAGNOSIS — I4891 Unspecified atrial fibrillation: Secondary | ICD-10-CM | POA: Diagnosis not present

## 2018-08-16 DIAGNOSIS — M545 Low back pain: Secondary | ICD-10-CM | POA: Diagnosis not present

## 2018-08-16 DIAGNOSIS — R7881 Bacteremia: Secondary | ICD-10-CM | POA: Diagnosis not present

## 2018-08-16 DIAGNOSIS — M961 Postlaminectomy syndrome, not elsewhere classified: Secondary | ICD-10-CM | POA: Diagnosis not present

## 2018-08-16 DIAGNOSIS — R079 Chest pain, unspecified: Secondary | ICD-10-CM | POA: Diagnosis not present

## 2018-08-16 DIAGNOSIS — R630 Anorexia: Secondary | ICD-10-CM | POA: Diagnosis present

## 2018-08-16 DIAGNOSIS — Z9882 Breast implant status: Secondary | ICD-10-CM

## 2018-08-16 DIAGNOSIS — G8929 Other chronic pain: Secondary | ICD-10-CM | POA: Diagnosis not present

## 2018-08-16 DIAGNOSIS — I2510 Atherosclerotic heart disease of native coronary artery without angina pectoris: Secondary | ICD-10-CM | POA: Diagnosis not present

## 2018-08-16 LAB — FIBRIN DERIVATIVES D-DIMER (ARMC ONLY): Fibrin derivatives D-dimer (ARMC): 3090.62 ng/mL (FEU) — ABNORMAL HIGH (ref 0.00–499.00)

## 2018-08-16 LAB — CBC WITH DIFFERENTIAL/PLATELET
Abs Immature Granulocytes: 0.09 10*3/uL — ABNORMAL HIGH (ref 0.00–0.07)
Basophils Absolute: 0 10*3/uL (ref 0.0–0.1)
Basophils Relative: 0 %
Eosinophils Absolute: 0.1 10*3/uL (ref 0.0–0.5)
Eosinophils Relative: 1 %
HCT: 43.1 % (ref 36.0–46.0)
Hemoglobin: 15 g/dL (ref 12.0–15.0)
Immature Granulocytes: 1 %
Lymphocytes Relative: 4 %
Lymphs Abs: 0.6 10*3/uL — ABNORMAL LOW (ref 0.7–4.0)
MCH: 31.8 pg (ref 26.0–34.0)
MCHC: 34.8 g/dL (ref 30.0–36.0)
MCV: 91.3 fL (ref 80.0–100.0)
Monocytes Absolute: 0.9 10*3/uL (ref 0.1–1.0)
Monocytes Relative: 5 %
Neutro Abs: 15.4 10*3/uL — ABNORMAL HIGH (ref 1.7–7.7)
Neutrophils Relative %: 89 %
Platelets: 158 10*3/uL (ref 150–400)
RBC: 4.72 MIL/uL (ref 3.87–5.11)
RDW: 12.8 % (ref 11.5–15.5)
WBC: 17.1 10*3/uL — ABNORMAL HIGH (ref 4.0–10.5)
nRBC: 0 % (ref 0.0–0.2)

## 2018-08-16 LAB — COMPREHENSIVE METABOLIC PANEL
ALT: 21 U/L (ref 0–44)
AST: 34 U/L (ref 15–41)
Albumin: 3.7 g/dL (ref 3.5–5.0)
Alkaline Phosphatase: 42 U/L (ref 38–126)
Anion gap: 15 (ref 5–15)
BUN: 47 mg/dL — ABNORMAL HIGH (ref 8–23)
CO2: 25 mmol/L (ref 22–32)
Calcium: 8.5 mg/dL — ABNORMAL LOW (ref 8.9–10.3)
Chloride: 93 mmol/L — ABNORMAL LOW (ref 98–111)
Creatinine, Ser: 1.42 mg/dL — ABNORMAL HIGH (ref 0.44–1.00)
GFR calc Af Amer: 43 mL/min — ABNORMAL LOW (ref >60)
GFR calc non Af Amer: 37 mL/min — ABNORMAL LOW (ref >60)
Glucose, Bld: 145 mg/dL — ABNORMAL HIGH (ref 70–99)
Potassium: 2.8 mmol/L — ABNORMAL LOW (ref 3.5–5.1)
Sodium: 133 mmol/L — ABNORMAL LOW (ref 135–145)
Total Bilirubin: 1.2 mg/dL (ref 0.3–1.2)
Total Protein: 7.3 g/dL (ref 6.5–8.1)

## 2018-08-16 LAB — LACTIC ACID, PLASMA
Lactic Acid, Venous: 3.1 mmol/L (ref 0.5–1.9)
Lactic Acid, Venous: 1.9 mmol/L (ref 0.5–1.9)
Lactic Acid, Venous: 1.9 mmol/L (ref 0.5–1.9)

## 2018-08-16 LAB — BLOOD GAS, VENOUS
Acid-Base Excess: 6.4 mmol/L — ABNORMAL HIGH (ref 0.0–2.0)
Bicarbonate: 29.4 mmol/L — ABNORMAL HIGH (ref 20.0–28.0)
O2 Saturation: 92.2 %
Patient temperature: 37
pCO2, Ven: 36 mmHg — ABNORMAL LOW (ref 44.0–60.0)
pH, Ven: 7.52 — ABNORMAL HIGH (ref 7.250–7.430)
pO2, Ven: 57 mmHg — ABNORMAL HIGH (ref 32.0–45.0)

## 2018-08-16 LAB — INFLUENZA PANEL BY PCR (TYPE A & B)
Influenza A By PCR: NEGATIVE
Influenza B By PCR: NEGATIVE

## 2018-08-16 LAB — TROPONIN I
Troponin I: 0.12 ng/mL (ref <0.03)
Troponin I: 0.14 ng/mL (ref <0.03)

## 2018-08-16 LAB — PROCALCITONIN: Procalcitonin: 1.64 ng/mL

## 2018-08-16 MED ORDER — ACETAMINOPHEN 650 MG RE SUPP: 650.0000 mg | Freq: Four times a day (QID) | RECTAL | Status: DC | PRN

## 2018-08-16 MED ORDER — IOHEXOL 350 MG/ML SOLN
60.0000 mL | Freq: Once | INTRAVENOUS | Status: AC | PRN
  Administered 2018-08-16: 17:00:00 60 mL via INTRAVENOUS

## 2018-08-16 MED ORDER — SODIUM CHLORIDE 0.9 % IV SOLN
500.0000 mg | Freq: Once | INTRAVENOUS | Status: AC
  Administered 2018-08-16: 500 mg via INTRAVENOUS
  Filled 2018-08-16: qty 500

## 2018-08-16 MED ORDER — GABAPENTIN 300 MG PO CAPS
300.0000 mg | ORAL_CAPSULE | Freq: Every day | ORAL | Status: DC
  Administered 2018-08-16 – 2018-08-18 (×3): 300 mg via ORAL
  Filled 2018-08-16 (×4): qty 1

## 2018-08-16 MED ORDER — CALCIUM CARBONATE ANTACID 500 MG PO CHEW
500.0000 mg | CHEWABLE_TABLET | Freq: Two times a day (BID) | ORAL | Status: DC
  Administered 2018-08-16 – 2018-08-19 (×6): 500 mg via ORAL
  Filled 2018-08-16 (×6): qty 3

## 2018-08-16 MED ORDER — ONDANSETRON HCL 4 MG/2ML IJ SOLN: 4.0000 mg | Freq: Four times a day (QID) | INTRAMUSCULAR | Status: DC | PRN

## 2018-08-16 MED ORDER — VANCOMYCIN VARIABLE DOSE PER UNSTABLE RENAL FUNCTION (PHARMACIST DOSING): Status: DC

## 2018-08-16 MED ORDER — PRAVASTATIN SODIUM 20 MG PO TABS
20.0000 mg | ORAL_TABLET | Freq: Every day | ORAL | Status: DC
  Administered 2018-08-16 – 2018-08-19 (×4): 20 mg via ORAL
  Filled 2018-08-16 (×4): qty 1

## 2018-08-16 MED ORDER — ENOXAPARIN SODIUM 40 MG/0.4ML ~~LOC~~ SOLN
40.0000 mg | SUBCUTANEOUS | Status: DC
  Administered 2018-08-16 – 2018-08-18 (×3): 40 mg via SUBCUTANEOUS
  Filled 2018-08-16 (×4): qty 0.4

## 2018-08-16 MED ORDER — MAGNESIUM SULFATE 2 GM/50ML IV SOLN
2.0000 g | Freq: Once | INTRAVENOUS | Status: AC
  Administered 2018-08-16: 15:00:00 2 g via INTRAVENOUS
  Filled 2018-08-16: qty 50

## 2018-08-16 MED ORDER — OMEGA-3-ACID ETHYL ESTERS 1 G PO CAPS
2.0000 g | ORAL_CAPSULE | Freq: Every day | ORAL | Status: DC
  Administered 2018-08-16 – 2018-08-19 (×4): 2 g via ORAL
  Filled 2018-08-16 (×4): qty 2

## 2018-08-16 MED ORDER — SODIUM CHLORIDE 0.9 % IV BOLUS
1000.0000 mL | Freq: Once | INTRAVENOUS | Status: AC
  Administered 2018-08-16: 15:00:00 1000 mL via INTRAVENOUS

## 2018-08-16 MED ORDER — SODIUM CHLORIDE 0.9 % IV SOLN
1.0000 g | Freq: Once | INTRAVENOUS | Status: AC
  Administered 2018-08-16: 1 g via INTRAVENOUS
  Filled 2018-08-16: qty 10

## 2018-08-16 MED ORDER — HYDROCODONE-ACETAMINOPHEN 5-325 MG PO TABS: 1.0000 | ORAL_TABLET | ORAL | Status: DC | PRN

## 2018-08-16 MED ORDER — SODIUM CHLORIDE 0.9 % IV BOLUS
1000.0000 mL | Freq: Once | INTRAVENOUS | Status: AC
  Administered 2018-08-16: 1000 mL via INTRAVENOUS

## 2018-08-16 MED ORDER — SODIUM CHLORIDE 0.9 % IV SOLN
Freq: Once | INTRAVENOUS | Status: AC
  Administered 2018-08-16: 16:00:00 via INTRAVENOUS

## 2018-08-16 MED ORDER — VITAMIN D3 25 MCG (1000 UNIT) PO TABS
1000.0000 [IU] | ORAL_TABLET | Freq: Every day | ORAL | Status: DC
  Administered 2018-08-16 – 2018-08-19 (×4): 1000 [IU] via ORAL
  Filled 2018-08-16 (×4): qty 1

## 2018-08-16 MED ORDER — ACETAMINOPHEN 325 MG PO TABS
650.0000 mg | ORAL_TABLET | Freq: Four times a day (QID) | ORAL | Status: DC | PRN
  Administered 2018-08-16 – 2018-08-19 (×3): 650 mg via ORAL
  Filled 2018-08-16 (×3): qty 2

## 2018-08-16 MED ORDER — SODIUM CHLORIDE 0.9 % IV SOLN
1.0000 g | INTRAVENOUS | Status: DC
  Filled 2018-08-16: qty 1

## 2018-08-16 MED ORDER — ADULT MULTIVITAMIN W/MINERALS CH
1.0000 | ORAL_TABLET | Freq: Every day | ORAL | Status: DC
  Administered 2018-08-16 – 2018-08-19 (×4): 1 via ORAL
  Filled 2018-08-16 (×4): qty 1

## 2018-08-16 MED ORDER — ONDANSETRON HCL 4 MG PO TABS: 4.0000 mg | ORAL_TABLET | Freq: Four times a day (QID) | ORAL | Status: DC | PRN

## 2018-08-16 MED ORDER — POTASSIUM CHLORIDE IN NACL 20-0.9 MEQ/L-% IV SOLN
INTRAVENOUS | Status: DC
  Administered 2018-08-16 – 2018-08-19 (×6): via INTRAVENOUS
  Filled 2018-08-16 (×12): qty 1000

## 2018-08-16 MED ORDER — PANTOPRAZOLE SODIUM 40 MG PO TBEC
40.0000 mg | DELAYED_RELEASE_TABLET | Freq: Every day | ORAL | Status: DC
  Administered 2018-08-16 – 2018-08-19 (×4): 40 mg via ORAL
  Filled 2018-08-16 (×4): qty 1

## 2018-08-16 MED ORDER — SODIUM CHLORIDE 0.9 % IV SOLN
2.0000 g | INTRAVENOUS | Status: DC
  Filled 2018-08-16: qty 20

## 2018-08-16 MED ORDER — VANCOMYCIN HCL 10 G IV SOLR
1500.0000 mg | Freq: Once | INTRAVENOUS | Status: AC
  Administered 2018-08-16: 17:00:00 1500 mg via INTRAVENOUS
  Filled 2018-08-16: qty 1500

## 2018-08-16 MED ORDER — FESOTERODINE FUMARATE ER 4 MG PO TB24
4.0000 mg | ORAL_TABLET | Freq: Every day | ORAL | Status: DC
  Administered 2018-08-16 – 2018-08-19 (×4): 4 mg via ORAL
  Filled 2018-08-16 (×4): qty 1

## 2018-08-16 MED ORDER — METOPROLOL TARTRATE 5 MG/5ML IV SOLN
5.0000 mg | INTRAVENOUS | Status: DC | PRN
  Filled 2018-08-16: qty 5

## 2018-08-16 MED ORDER — METOPROLOL TARTRATE 25 MG PO TABS
25.0000 mg | ORAL_TABLET | Freq: Two times a day (BID) | ORAL | Status: DC
  Administered 2018-08-17 – 2018-08-19 (×5): 25 mg via ORAL
  Filled 2018-08-16 (×7): qty 1

## 2018-08-16 MED ORDER — SENNOSIDES-DOCUSATE SODIUM 8.6-50 MG PO TABS: 1.0000 | ORAL_TABLET | Freq: Every evening | ORAL | Status: DC | PRN

## 2018-08-16 NOTE — Telephone Encounter (Signed)
I called patient's number, left message to let her know that I'm thinking about her  Will follow along in chart

## 2018-08-16 NOTE — Progress Notes (Signed)
ID 73 yr female - spoke to her on the phone and got history, chart reviewed Over the weekend she was working a lot in the yard Fatigued since Tuesday Took a couple of ibuprofen Poor appetite Had a temp of 99  runny nose/ cough last week some sob for the past 3 days No diarrhea No urinary symptoms Lives with her husband and daughter and they are doing okay Traveled to [place] in Jan for 2 weeks.and returned in feb BP (!) 111/47 (BP Location: Left Arm)   Pulse (!) 36   Temp (!) 102.5 F (39.2 C) (Oral)   Resp 18   Ht 5\' 4"  (1.626 m)   Wt 61.6 kg   LMP  (LMP Unknown)   SpO2 100%   BMI 23.33 kg/m   CBC Latest Ref Rng & Units 08/16/2018 02/06/2018 01/29/2018  WBC 4.0 - 10.5 K/uL 17.1(H) 8.9 12.6(H)  Hemoglobin 12.0 - 15.0 g/dL 15.0 12.4 14.3  Hematocrit 36.0 - 46.0 % 43.1 36.3 41.4  Platelets 150 - 400 K/uL 158 316 182   CMP Latest Ref Rng & Units 08/16/2018 04/23/2018 04/05/2018  Glucose 70 - 99 mg/dL 145(H) 71 -  BUN 8 - 23 mg/dL 47(H) 21 -  Creatinine 0.44 - 1.00 mg/dL 1.42(H) 0.68 -  Sodium 135 - 145 mmol/L 133(L) 140 -  Potassium 3.5 - 5.1 mmol/L 2.8(L) 4.6 -  Chloride 98 - 111 mmol/L 93(L) 104 -  CO2 22 - 32 mmol/L 25 31 -  Calcium 8.9 - 10.3 mg/dL 8.5(L) 9.4 -  Total Protein 6.5 - 8.1 g/dL 7.3 - 6.1  Total Bilirubin 0.3 - 1.2 mg/dL 1.2 - 0.4  Alkaline Phos 38 - 126 U/L 42 - -  AST 15 - 41 U/L 34 - 22  ALT 0 - 44 U/L 21 - 12     CT chest : no infiltrate  Impression- pt with fever, cough sob Viral illness likely- COVID test sent, Influenza negative No evidence of pneumonia, not hypoxic. Check procalcitonin Got azithro, ceftriaxone one dose- hold off vanco Discussed with patient Will see her in AM

## 2018-08-16 NOTE — Consult Note (Signed)
CODE SEPSIS - PHARMACY COMMUNICATION  **Broad Spectrum Antibiotics should be administered within 1 hour of Sepsis diagnosis**  Time Code Sepsis Called/Page Received: 1530  Antibiotics Ordered: Azithromycin 500 mg and CTX 1 g   Time of 1st antibiotic administration: CTX @ [place] ,PharmD Clinical Pharmacist  08/16/2018  3:43 PM

## 2018-08-16 NOTE — ED Provider Notes (Signed)
[facility] Emergency Department Provider Note  ____________________________________________  Time seen: Approximately 3:01 PM  I have reviewed the triage vital signs and the nursing notes.   HISTORY  Chief Complaint Shortness of Breath   HPI [name] is a 73 y.o. female with history as listed below who presents for evaluation of shortness of breath.  Patient reports recently returning from a trip in [place] in the end of February.  She reports 1 week of dry cough followed by 3 days of progressively worsening shortness of breath which became constant and severe today.  She reports chills but has not checked her temperature at home.  Patient reports malaise and generalized weakness and decreased appetite. No nausea or vomiting.  No chest pain.  No personal family history of PE or DVT, recent immobilization, leg pain or swelling, hemoptysis.  She is on supplemental estrogen.  Past Medical History:  Diagnosis Date   Arthritis    in back   Cataract 2011   remove from right eye   Dyslipidemia, goal LDL below 130 10/28/2015   Hormone replacement therapy (postmenopausal) 10/28/2015   Hyperlipidemia    Hypertension    Osteopenia    Patient is full code 12/05/2016   TMJ (dislocation of temporomandibular joint)     Patient Active Problem List   Diagnosis Date Noted   Essential hypertension, benign 05/24/2018   CLE (columnar lined esophagus)    H. pylori infection    Heartburn    Osteopenia 12/12/2017   Hormone replacement therapy (HRT) 12/12/2017   Patient is full code 12/05/2016   Insomnia 04/10/2016   Hypertensive retinopathy 02/15/2016   Urine incontinence 02/15/2016   Breast cancer screening 10/28/2015   Medication monitoring encounter 10/28/2015   Dyslipidemia, goal LDL below 100 10/28/2015   Hormone replacement therapy (postmenopausal) 10/28/2015   Preventative health care 10/28/2015   Post-menopausal 10/07/2015    Diarrhea, travelers' 09/08/2015   Postzoster neuralgia 05/15/2015   Dermatitis 05/15/2015    Past Surgical History:  Procedure Laterality Date    MESH SLING X 15 YRS     ANTERIOR AND POSTERIOR REPAIR     AUGMENTATION MAMMAPLASTY Bilateral    20 yrs ago , then replaced 5 yrs ago   [place]  2012   BILATERAL BREAST IMPLANT REMOVAL   ESOPHAGOGASTRODUODENOSCOPY (EGD) WITH PROPOFOL N/A 05/04/2018   Procedure: ESOPHAGOGASTRODUODENOSCOPY (EGD) WITH PROPOFOL;  Surgeon: Virgel Manifold, MD;  Location: [facility] ENDOSCOPY;  Service: Endoscopy;  Laterality: N/A;   EYE SURGERY  20114   right eye   INCONTINENCE SURGERY     NASAL SINUS SURGERY     NOSE SURGERY     VAGINAL HYSTERECTOMY     still has ovaries    Prior to Admission medications   Medication Sig Start Date End Date Taking? Authorizing Provider  Alfalfa 250 MG TABS Take by mouth.    [provider]  calcium carbonate (OS-CAL) 600 MG TABS Take 600 mg by mouth as needed.     [provider]  celecoxib (CELEBREX) 200 MG capsule Take 200 mg by mouth daily.  08/12/13   [provider]  chloroquine (ARALEN) 250 MG tablet Take 2 tablets (500 mg total) by mouth once a week. 10/28/15   Arnetha Courser, MD  chlorthalidone (HYGROTON) 25 MG tablet Take 0.5-1 tablets (12.5-25 mg total) by mouth daily. 05/24/18   Arnetha Courser, MD  cholecalciferol (  VITAMIN D) 1000 UNITS tablet Take 1,000 Units by mouth daily.    [provider]  doxycycline (VIBRA-TABS) 100 MG tablet Take 1 tablet (100 mg total) by mouth 2 (two) times daily. Patient not taking: Reported on 08/01/2018 05/14/18   Virgel Manifold, MD  EST ESTROGENS-METHYLTEST DS 1.25-2.5 MG TABS Take 1 tablet by mouth daily. 05/02/18   Lada, Satira Anis, MD  fish oil-omega-3 fatty acids 1000 MG capsule Take 2 g by mouth daily.    [provider]  gabapentin (NEURONTIN) 300 MG  capsule Take 1 capsule (300 mg total) by mouth at bedtime. 08/02/18   Arnetha Courser, MD  Multiple Vitamin (MULTIVITAMIN) capsule Take 1 capsule by mouth daily.    [provider]  multivitamin-lutein (OCUVITE-LUTEIN) CAPS Take 1 capsule by mouth daily.    [provider]  OLIVE LEAF EXTRACT PO Take 1 tablet by mouth daily.    [provider]  omeprazole (PRILOSEC) 20 MG capsule Take 1 capsule (20 mg total) by mouth daily. 08/02/18   Virgel Manifold, MD  pantoprazole (PROTONIX) 20 MG tablet Take 1 tablet (20 mg total) by mouth 2 (two) times daily for 14 days. 05/14/18 05/28/18  Virgel Manifold, MD  pravastatin (PRAVACHOL) 20 MG tablet Take 1 tablet (20 mg total) by mouth daily. 04/06/18   Lada, Satira Anis, MD  tolterodine (DETROL LA) 4 MG 24 hr capsule Take 1 capsule (4 mg total) by mouth daily. 04/18/18   Arnetha Courser, MD  triamcinolone cream (KENALOG) 0.5 % Apply 1 application topically 3 (three) times daily. As needed 02/15/16   Lada, Satira Anis, MD  valACYclovir (VALTREX) 500 MG tablet Take 1 tablet (500 mg total) by mouth 2 (two) times daily. For three days in a row during outbreaks. 02/06/18   Arnetha Courser, MD    Allergies Tizanidine  Family History  Problem Relation Age of Onset   Cancer Mother        COLON AND UTERINE   Cancer Father        PROSTATE   Diabetes Father    Hypertension Father    Stroke Father    Arthritis Sister    Diabetes Son    Breast cancer Neg Hx     Social History Social History   Tobacco Use   Smoking status: Never Smoker   Smokeless tobacco: Never Used   Tobacco comment: smoking cessation materials not required  Substance Use Topics   Alcohol use: No   Drug use: No    Review of Systems  Constitutional: Negative for fever. Eyes: Negative for visual changes. ENT: Negative for sore throat. Neck: No neck pain  Cardiovascular: Negative for chest pain. Respiratory: + shortness of breath and  cough Gastrointestinal: Negative for abdominal pain, vomiting or diarrhea. Genitourinary: Negative for dysuria. Musculoskeletal: Negative for back pain. Skin: Negative for rash. Neurological: Negative for headaches, weakness or numbness. Psych: No SI or HI  ____________________________________________   PHYSICAL EXAM:  VITAL SIGNS: ED Triage Vitals  Enc Vitals Group     BP 08/16/18 1431 (!) 89/40     Pulse Rate 08/16/18 1438 (!) 162     Resp 08/16/18 1431 18     Temp 08/16/18 1438 98.9 F (37.2 C)     Temp Source 08/16/18 1438 Oral     SpO2 08/16/18 1438 100 %     Weight 08/16/18 1449 130 lb (59 kg)     Height 08/16/18 1449 5\' 4"  (1.626 m)  Head Circumference --      Peak Flow --      Pain Score 08/16/18 1448 5     Pain Loc --      Pain Edu? --      Excl. in [place]? --     Constitutional: Alert and oriented, moderate respiratory distress.  HEENT:      Head: Normocephalic and atraumatic.         Eyes: Conjunctivae are normal. Sclera is non-icteric.       Mouth/Throat: Mucous membranes are moist.       Neck: Supple with no signs of meningismus. Cardiovascular: Irregularly irregular rhythm with tachycardic. No murmurs, gallops, or rubs. 2+ symmetrical distal pulses are present in all extremities. No JVD. Respiratory: Mild increased work of breathing, hypoxic on room air which improved on 3 L nasal cannula  gastrointestinal: Soft, non tender, and non distended with positive bowel sounds. No rebound or guarding. Musculoskeletal: Nontender with normal range of motion in all extremities. No edema, cyanosis, or erythema of extremities. Neurologic: Normal speech and language. Face is symmetric. Moving all extremities. No gross focal neurologic deficits are appreciated. Skin: Skin is warm, dry and intact. No rash noted. Psychiatric: Mood and affect are normal. Speech and behavior are normal.  ____________________________________________   LABS (all labs ordered are listed, but  only abnormal results are displayed)  Labs Reviewed  LACTIC ACID, PLASMA - Abnormal; Notable for the following components:      Result Value   Lactic Acid, Venous 3.1 (*)    All other components within normal limits  CBC WITH DIFFERENTIAL/PLATELET - Abnormal; Notable for the following components:   WBC 17.1 (*)    Neutro Abs 15.4 (*)    Lymphs Abs 0.6 (*)    Abs Immature Granulocytes 0.09 (*)    All other components within normal limits  BLOOD GAS, VENOUS - Abnormal; Notable for the following components:   pH, Ven 7.52 (*)    pCO2, Ven 36 (*)    pO2, Ven 57.0 (*)    Bicarbonate 29.4 (*)    Acid-Base Excess 6.4 (*)    All other components within normal limits  CULTURE, BLOOD (ROUTINE X 2)  CULTURE, BLOOD (ROUTINE X 2)  NOVEL CORONAVIRUS, NAA (HOSPITAL ORDER, SEND-OUT TO REF LAB)  TROPONIN I  FIBRIN DERIVATIVES D-DIMER ([facility] ONLY)  COMPREHENSIVE METABOLIC PANEL  INFLUENZA PANEL BY PCR (TYPE A & B)  URINALYSIS, ROUTINE W REFLEX MICROSCOPIC   ____________________________________________  EKG  ED ECG REPORT I, Rudene Re, the attending physician, personally viewed and interpreted this ECG.  Atrial fibrillation, ventricular rate of 179, normal QTC, right axis deviation, anterior Q waves, mild diffuse ST depressions with no ST elevation.  Afib is new when compared to prior. ____________________________________________  RADIOLOGY  I have personally reviewed the images performed during this visit and I agree with the Radiologist's read.   Interpretation by Radiologist:  Dg Chest Portable 1 View  Result Date: 08/16/2018 CLINICAL DATA:  Shortness of breath EXAM: PORTABLE CHEST 1 VIEW COMPARISON:  None. FINDINGS: Normal heart size and vascularity. Mild hyperinflation. No focal pneumonia, collapse or consolidation. Negative for edema, effusion or pneumothorax. Biapical pleural scarring noted. Trachea is midline. Aorta atherosclerotic. Degenerative changes of the spine with  mild scoliosis. IMPRESSION: Mild hyperinflation.  No superimposed acute chest process. Electronically Signed   By: Jerilynn Mages.  Shick M.D.   On: 08/16/2018 15:11     ____________________________________________   PROCEDURES  Procedure(s) performed: None Procedures Critical Care performed:  yes  CRITICAL CARE Performed by: Rudene Re  ?  Total critical care time: 40 min  Critical care time was exclusive of separately billable procedures and treating other patients.  Critical care was necessary to treat or prevent imminent or life-threatening deterioration.  Critical care was time spent personally by me on the following activities: development of treatment plan with patient and/or surrogate as well as nursing, discussions with consultants, evaluation of patient's response to treatment, examination of patient, obtaining history from patient or surrogate, ordering and performing treatments and interventions, ordering and review of laboratory studies, ordering and review of radiographic studies, pulse oximetry and re-evaluation of patient's condition.  ____________________________________________   INITIAL IMPRESSION / ASSESSMENT AND PLAN / ED COURSE   73 y.o. female with history as listed below who presents for evaluation of shortness of breath.  Patient initially with a week of cough and now 3 days of shortness of breath.  Cough has resolved.  Per EMS patient had a temp of 99.8 and was hypoxic on room air.  She was transported on a nonrebreather.  Upon arrival to the emergency room patient mildly increased work of breathing, hypoxic.  Weaned to 3 L nasal cannula. Hypoxic to the upper 80s on room air.  Limited lung exam due to use CAPR during my evaluation. EKG showing afib with RVR. Ddx PNA, PE, Flu, Covid, pulmonary edema. Patient started on IVF, IV mag for rate control and sepsis. Labs pending. CXR with no obvious PNA or edema. D-dimer pending. Flu and covid swab pending.      _________________________ 3:30 PM on 08/16/2018 -----------------------------------------  Elevated WBC and lactic concerning for PNA. CTA pending to rule out PE. Discussed admission with Dr. Estanislado Pandy   As part of my medical decision making, I reviewed the following data within the [place] notes reviewed and incorporated, Labs reviewed , EKG interpreted , Old EKG reviewed, Old chart reviewed, Radiograph reviewed , Discussed with admitting physician , Notes from prior ED visits and [place] Controlled Substance Database    Pertinent labs & imaging results that were available during my care of the patient were reviewed by me and considered in my medical decision making (see chart for details).    ____________________________________________   FINAL CLINICAL IMPRESSION(S) / ED DIAGNOSES  Final diagnoses:  Acute respiratory failure with hypoxia (HCC)  Atrial fibrillation with RVR (HCC)  Sepsis with acute hypoxic respiratory failure without septic shock, due to unspecified organism [facility])      NEW MEDICATIONS STARTED DURING THIS VISIT:  ED Discharge Orders    None       Note:  This document was prepared using Dragon voice recognition software and may include unintentional dictation errors.    Alfred Levins, [place], MD 08/16/18 [phone]

## 2018-08-16 NOTE — ED Notes (Addendum)
ED TO INPATIENT HANDOFF REPORT  ED Nurse Name and Phone #: Metta Clines [phone]   S Name/Age/Gender [name] 73 y.o. female Room/Bed: ED11A/ED11A  Code Status   Code Status: Not on file  Home/SNF/Other Home Patient oriented to: self, place, time and situation Is this baseline? Yes   Triage Complete: Triage complete  Chief Complaint sob  Triage Note Pt arrived via ems with complaints of shortness of breath for 3 days. Pt arrives on non rebreather    Allergies Allergies  Allergen Reactions  . Tizanidine Other (See Comments)    Unable to sleep.    Level of Care/Admitting Diagnosis ED Disposition    ED Disposition Condition [facility]: [place] [[id number]]  Level of Care: Med-Surg [16]  Diagnosis: Sepsis [facility]) [[id number]]  Admitting Physician: Saundra Shelling [[id number]]  Attending Physician: Saundra Shelling [[id number]]  Estimated length of stay: past midnight tomorrow  Certification:: I certify this patient will need inpatient services for at least 2 midnights  PT Class (Do Not Modify): Inpatient [101]  PT Acc Code (Do Not Modify): Private [1]       B Medical/Surgery History Past Medical History:  Diagnosis Date  . Arthritis    in back  . Cataract 2011   remove from right eye  . Dyslipidemia, goal LDL below 130 10/28/2015  . Hormone replacement therapy (postmenopausal) 10/28/2015  . Hyperlipidemia   . Hypertension   . Osteopenia   . Patient is full code 12/05/2016  . TMJ (dislocation of temporomandibular joint)    Past Surgical History:  Procedure Laterality Date  .  MESH SLING X 15 YRS    . ANTERIOR AND POSTERIOR REPAIR    . AUGMENTATION MAMMAPLASTY Bilateral    20 yrs ago , then replaced 5 yrs ago  . BLADDER SUSPENSION    . BREAST ENHANCEMENT SURGERY    . BREAST SURGERY  2012   BILATERAL BREAST IMPLANT REMOVAL  . ESOPHAGOGASTRODUODENOSCOPY (EGD) WITH PROPOFOL N/A 05/04/2018   Procedure:  ESOPHAGOGASTRODUODENOSCOPY (EGD) WITH PROPOFOL;  Surgeon: Virgel Manifold, MD;  Location: [facility] ENDOSCOPY;  Service: Endoscopy;  Laterality: N/A;  . EYE SURGERY  20114   right eye  . INCONTINENCE SURGERY    . NASAL SINUS SURGERY    . NOSE SURGERY    . VAGINAL HYSTERECTOMY     still has ovaries     A IV Location/Drains/Wounds Patient Lines/Drains/Airways Status   Active Line/Drains/Airways    Name:   Placement date:   Placement time:   Site:   Days:   Peripheral IV 08/16/18 Left Antecubital   08/16/18    1438    Antecubital   less than 1   Peripheral IV 08/16/18 Right Antecubital   08/16/18    1446    Antecubital   less than 1   Peripheral IV 08/16/18 Right Arm   08/16/18    1447    Arm   less than 1          Intake/Output Last 24 hours  Intake/Output Summary (Last 24 hours) at 08/16/2018 1608 Last data filed at 08/16/2018 1551 Gross per 24 hour  Intake 1100 ml  Output -  Net 1100 ml    Labs/Imaging Results for orders placed or performed during the hospital encounter of 08/16/18 (from the past 48 hour(s))  Blood gas, venous     Status: Abnormal   Collection Time: 08/16/18  2:25 PM  Result Value Ref Range   pH, Lawson Fiscal  7.52 (H) 7.250 - 7.430   pCO2, Ven 36 (L) 44.0 - 60.0 mmHg   pO2, Ven 57.0 (H) 32.0 - 45.0 mmHg   Bicarbonate 29.4 (H) 20.0 - 28.0 mmol/L   Acid-Base Excess 6.4 (H) 0.0 - 2.0 mmol/L   O2 Saturation 92.2 %   Patient temperature 37.0    Collection site VEIN    Sample type VEIN     Comment: Performed at [facility] [address] [place], [place] [address]  Troponin I - ONCE - STAT     Status: Abnormal   Collection Time: 08/16/18  2:39 PM  Result Value Ref Range   Troponin I 0.12 (HH) <0.03 ng/mL    Comment: CRITICAL RESULT CALLED TO, READ BACK BY AND VERIFIED WITH GREG MOYER 08/16/18 1552 KLW Performed at [facility], [address] [place], [place] [address]   Fibrin derivatives D-Dimer [facility] only)     Status: Abnormal    Collection Time: 08/16/18  2:39 PM  Result Value Ref Range   Fibrin derivatives D-dimer ([facility]) 3,090.62 (H) 0.00 - 499.00 ng/mL (FEU)    Comment: (NOTE) <> Exclusion of Venous Thromboembolism (VTE) - OUTPATIENT ONLY   (Emergency Department or [place])   0-499 ng/ml (FEU): With a low to intermediate pretest probability                      for VTE this test result excludes the diagnosis                      of VTE.   >499 ng/ml (FEU) : VTE not excluded; additional work up for VTE is                      required. <> Testing on Inpatients and Evaluation of Disseminated Intravascular   Coagulation (DIC) Reference Range:   0-499 ng/ml (FEU) Performed at [facility], [address] [place], [place] [address]   Lactic acid, plasma     Status: Abnormal   Collection Time: 08/16/18  2:39 PM  Result Value Ref Range   Lactic Acid, Venous 3.1 (HH) 0.5 - 1.9 mmol/L    Comment: CRITICAL RESULT CALLED TO, READ BACK BY AND VERIFIED WITH KAILEY WALKER 08/16/18 1520 KLW Performed at [facility], [address] [place], [place] [address]   CBC with Differential/Platelet     Status: Abnormal   Collection Time: 08/16/18  2:39 PM  Result Value Ref Range   WBC 17.1 (H) 4.0 - 10.5 K/uL   RBC 4.72 3.87 - 5.11 MIL/uL   Hemoglobin 15.0 12.0 - 15.0 g/dL   HCT 43.1 36.0 - 46.0 %   MCV 91.3 80.0 - 100.0 fL   MCH 31.8 26.0 - 34.0 pg   MCHC 34.8 30.0 - 36.0 g/dL   RDW 12.8 11.5 - 15.5 %   Platelets 158 150 - 400 K/uL   nRBC 0.0 0.0 - 0.2 %   Neutrophils Relative % 89 %   Neutro Abs 15.4 (H) 1.7 - 7.7 K/uL   Lymphocytes Relative 4 %   Lymphs Abs 0.6 (L) 0.7 - 4.0 K/uL   Monocytes Relative 5 %   Monocytes Absolute 0.9 0.1 - 1.0 K/uL   Eosinophils Relative 1 %   Eosinophils Absolute 0.1 0.0 - 0.5 K/uL   Basophils Relative 0 %   Basophils Absolute 0.0 0.0 - 0.1 K/uL   Immature Granulocytes 1 %   Abs  Immature Granulocytes 0.09 (H) 0.00 - 0.07 K/uL    Comment: Performed at [facility], [address] [place], [place] [address]  Comprehensive metabolic panel     Status: Abnormal   Collection Time: 08/16/18  2:39 PM  Result Value Ref Range   Sodium 133 (L) 135 - 145 mmol/L   Potassium 2.8 (L) 3.5 - 5.1 mmol/L   Chloride 93 (L) 98 - 111 mmol/L   CO2 25 22 - 32 mmol/L   Glucose, Bld 145 (H) 70 - 99 mg/dL   BUN 47 (H) 8 - 23 mg/dL   Creatinine, Ser 1.42 (H) 0.44 - 1.00 mg/dL   Calcium 8.5 (L) 8.9 - 10.3 mg/dL   Total Protein 7.3 6.5 - 8.1 g/dL   Albumin 3.7 3.5 - 5.0 g/dL   AST 34 15 - 41 U/L   ALT 21 0 - 44 U/L   Alkaline Phosphatase 42 38 - 126 U/L   Total Bilirubin 1.2 0.3 - 1.2 mg/dL   GFR calc non Af Amer 37 (L) >60 mL/min   GFR calc Af Amer 43 (L) >60 mL/min   Anion gap 15 5 - 15    Comment: Performed at [facility], [address] [place], [place] [address]  Influenza panel by PCR (type A & B)     Status: None   Collection Time: 08/16/18  2:39 PM  Result Value Ref Range   Influenza A By PCR NEGATIVE NEGATIVE   Influenza B By PCR NEGATIVE NEGATIVE    Comment: (NOTE) The Xpert Xpress Flu assay is intended as an aid in the diagnosis of  influenza and should not be used as a sole basis for treatment.  This  assay is FDA approved for nasopharyngeal swab specimens only. Nasal  washings and aspirates are unacceptable for Xpert Xpress Flu testing. Performed at [facility], [address] [place],  [address]    Dg Chest Portable 1 View  Result Date: 08/16/2018 CLINICAL DATA:  Shortness of breath EXAM: PORTABLE CHEST 1 VIEW COMPARISON:  None. FINDINGS: Normal heart size and vascularity. Mild hyperinflation. No focal pneumonia, collapse or consolidation. Negative for edema, effusion or pneumothorax. Biapical pleural scarring noted. Trachea is midline. Aorta atherosclerotic. Degenerative changes of the spine with mild scoliosis. IMPRESSION: Mild hyperinflation.  No superimposed acute chest process. Electronically Signed    By: Jerilynn Mages.  Shick M.D.   On: 08/16/2018 15:11    Pending Labs Unresulted Labs (From admission, onward)    Start     Ordered   08/16/18 1800  Lactic acid, plasma  Once,   STAT     08/16/18 1600   08/16/18 1524  Urinalysis, Routine w reflex microscopic  ONCE - STAT,   STAT     08/16/18 1523   08/16/18 1425  Novel Coronavirus, NAA (hospital order; send-out to ref lab)  (Novel Coronavirus, NAA [facility] Order; send-out to ref lab) with precautions panel)  Once,   STAT    Question Answer Comment  Current symptoms Fever and Shortness of breath   Excluded other viral illnesses Yes   Exposure Risk Traveled to high risk areas in the last 14 days   Patient immune status Normal      08/16/18 1424   08/16/18 1424  Blood culture (routine x 2)  BLOOD CULTURE X 2,   STAT     08/16/18 1423   Signed and Held  CBC  (enoxaparin (LOVENOX)    CrCl >/= 30 ml/min)  Once,  R    Comments:  Baseline for enoxaparin therapy IF NOT ALREADY DRAWN.  Notify MD if PLT < 100 K.    Signed and Held   Signed and Held  Creatinine, serum  (enoxaparin (LOVENOX)    CrCl >/= 30 ml/min)  Once,   R    Comments:  Baseline for enoxaparin therapy IF NOT ALREADY DRAWN.    Signed and Held   Signed and Held  Creatinine, serum  (enoxaparin (LOVENOX)    CrCl >/= 30 ml/min)  Weekly,   R    Comments:  while on enoxaparin therapy    Signed and Held   Signed and Held  Basic metabolic panel  Tomorrow morning,   R     Signed and Held   Signed and Held  CBC  Tomorrow morning,   R     Signed and Held          Vitals/Pain Today's Vitals   08/16/18 1449 08/16/18 1458 08/16/18 1548 08/16/18 1550  BP:  115/72 108/67 112/72  Pulse:  (!) 120 (!) 105 99  Resp:  18 (!) 21 17  Temp:      TempSrc:      SpO2:  96% 100% 96%  Weight: 59 kg     Height: 5\' 4"  (1.626 m)     PainSc:        Isolation Precautions Droplet and Contact precautions  Medications Medications  azithromycin (ZITHROMAX) 500 mg in sodium chloride 0.9 % 250 mL IVPB  (500 mg Intravenous New Bag/Given 08/16/18 1509)  sodium chloride 0.9 % bolus 1,000 mL (0 mLs Intravenous Stopped 08/16/18 1551)  magnesium sulfate IVPB 2 g 50 mL (0 g Intravenous Stopped 08/16/18 1551)  cefTRIAXone (ROCEPHIN) 1 g in sodium chloride 0.9 % 100 mL IVPB (0 g Intravenous Stopped 08/16/18 1551)  0.9 %  sodium chloride infusion ( Intravenous New Bag/Given 08/16/18 1553)  sodium chloride 0.9 % bolus 1,000 mL (1,000 mLs Intravenous New Bag/Given 08/16/18 1439)    Mobility Normally walks but hasn't been up much the past few days Low fall risk   Focused Assessments Trop 0.12; Lactic 3.1; Travelled to [place] recently; possible COVID-19; SOB   R Recommendations: See Admitting Provider Note  Report given to:   Additional Notes:  5L O2 HFNC; received [place] bolus x2 for high lactic and soft BP; 3 IV access; antibiotic and continuous fluids running now.

## 2018-08-16 NOTE — ED Notes (Signed)
Second liter started emergently due to low blood pressure reading per MD verbal order; fluids given through pressure bag by MD

## 2018-08-16 NOTE — Consult Note (Signed)
Pharmacy Antibiotic Note  [name] is a 72 y.o. female admitted on 08/16/2018 with sepsis.  Pharmacy has been consulted for Cefepime/Vancomycin dosing. Given patient's current renal function, will not dose Vancomycin using AUC calculator. Additionally, patient will need Cefepime dosed based on eGFR and actually body weight.   Scr: 1.42 (baseline 0.68, 04/23/2018)   Plan: Start Vancomycin 1500 mg x 1 dose and will obtain a Vancomycin Random level in 23 hours.  Start Cefepime 1 g Q24H.  Will monitor serum creatinine with AM labs.    Height: '5\' 4"'  (162.6 cm) Weight: 130 lb (59 kg) IBW/kg (Calculated) : 54.7  Temp (24hrs), Avg:98.9 F (37.2 C), Min:98.9 F (37.2 C), Max:98.9 F (37.2 C)  Recent Labs  Lab 08/16/18 1439  WBC 17.1*  CREATININE 1.42*  LATICACIDVEN 3.1*    Estimated Creatinine Clearance: 30.9 mL/min (A) (by C-G formula based on SCr of 1.42 mg/dL (H)).    Allergies  Allergen Reactions  . Tizanidine Other (See Comments)    Unable to sleep.    Antimicrobials this admission: Azithromycin  4/2 >> 4/2  CTX 4/2 >> 4/2  Cefepime 4/2 >> Vancomycin 4/2 >>   Dose adjustments this admission: N/A  Microbiology results: 4/2  BCx: pending  4/2  COVID: pending   Thank you for allowing pharmacy to be a part of this patient's care.  Rowland Lathe, PharmD 08/16/2018 4:11 PM

## 2018-08-16 NOTE — ED Notes (Signed)
Pt's cell phone number [phone]

## 2018-08-16 NOTE — H&P (Addendum)
[facility] [place] NAME: Tagan Bartram    MR#:  [id number]  DATE OF BIRTH:  08/04/1945  DATE OF ADMISSION:  08/16/2018  PRIMARY CARE PHYSICIAN: Arnetha Courser, MD   REQUESTING/REFERRING PHYSICIAN:   CHIEF COMPLAINT:   Chief Complaint  Patient presents with  . Shortness of Breath    HISTORY OF PRESENT ILLNESS: Lashaye Fisk  is a 73 y.o. female with a known history of hyperlipidemia, hypertension, arthritis, osteopenia presented to the emergency room for shortness of breath, cough and runny nose and fever.  Symptoms have been going on for 1 week.  Patient has fever as well as cough.  Cough is dry in nature.  Patient recently traveled to [place] in the month of January and returned back in the first week of February.  Patient states there was no outbreak of any coronavirus disease at that time in [place].  No sick contacts at home.  Chest x-ray is clear.  WBC count is elevated and patient has lymphopenia.  Lactic acid is elevated.  Patient received 5 L of IV fluids based on sepsis protocol and antibiotics were started.  Novel coronavirus test was sent by ER physician.  Patient currently on droplet and contact precautions.  Regular flu test has also been sent.  No history of any pollen allergy.  In the emergency room patient was hypoxic put on oxygen via nasal cannula at 3 L.  This is a new oxygen requirement for her.  PAST MEDICAL HISTORY:   Past Medical History:  Diagnosis Date  . Arthritis    in back  . Cataract 2011   remove from right eye  . Dyslipidemia, goal LDL below 130 10/28/2015  . Hormone replacement therapy (postmenopausal) 10/28/2015  . Hyperlipidemia   . Hypertension   . Osteopenia   . Patient is full code 12/05/2016  . TMJ (dislocation of temporomandibular joint)     PAST SURGICAL HISTORY:  Past Surgical History:  Procedure Laterality Date  .  MESH SLING X 15 YRS    . ANTERIOR AND POSTERIOR REPAIR    .  AUGMENTATION MAMMAPLASTY Bilateral    20 yrs ago , then replaced 5 yrs ago  . BLADDER SUSPENSION    . BREAST ENHANCEMENT SURGERY    . BREAST SURGERY  2012   BILATERAL BREAST IMPLANT REMOVAL  . ESOPHAGOGASTRODUODENOSCOPY (EGD) WITH PROPOFOL N/A 05/04/2018   Procedure: ESOPHAGOGASTRODUODENOSCOPY (EGD) WITH PROPOFOL;  Surgeon: Virgel Manifold, MD;  Location: [facility] ENDOSCOPY;  Service: Endoscopy;  Laterality: N/A;  . EYE SURGERY  20114   right eye  . INCONTINENCE SURGERY    . NASAL SINUS SURGERY    . NOSE SURGERY    . VAGINAL HYSTERECTOMY     still has ovaries    SOCIAL HISTORY:  Social History   Tobacco Use  . Smoking status: Never Smoker  . Smokeless tobacco: Never Used  . Tobacco comment: smoking cessation materials not required  Substance Use Topics  . Alcohol use: No    FAMILY HISTORY:  Family History  Problem Relation Age of Onset  . Cancer Mother        COLON AND UTERINE  . Cancer Father        PROSTATE  . Diabetes Father   . Hypertension Father   . Stroke Father   . Arthritis Sister   . Diabetes Son   . Breast cancer Neg Hx     DRUG ALLERGIES:  Allergies  Allergen  Reactions  . Tizanidine Other (See Comments)    Unable to sleep.    REVIEW OF SYSTEMS:   CONSTITUTIONAL: Has fever, has fatigue and weakness.  EYES: No blurred or double vision.  EARS, NOSE, AND THROAT: No tinnitus or ear pain.  RESPIRATORY: Has cough, shortness of breath,  No wheezing or hemoptysis.  CARDIOVASCULAR: No chest pain, orthopnea, edema.  GASTROINTESTINAL: No nausea, vomiting, diarrhea or abdominal pain.  GENITOURINARY: No dysuria, hematuria.  ENDOCRINE: No polyuria, nocturia,  HEMATOLOGY: No anemia, easy bruising or bleeding SKIN: No rash or lesion. MUSCULOSKELETAL: No joint pain or arthritis.   NEUROLOGIC: No tingling, numbness, weakness.  PSYCHIATRY: No anxiety or depression.   MEDICATIONS AT HOME:  Prior to Admission medications   Medication Sig Start Date End Date  Taking? Authorizing Provider  Alfalfa 250 MG TABS Take by mouth.    [provider]  calcium carbonate (OS-CAL) 600 MG TABS Take 600 mg by mouth as needed.     [provider]  celecoxib (CELEBREX) 200 MG capsule Take 200 mg by mouth daily.  08/12/13   [provider]  chloroquine (ARALEN) 250 MG tablet Take 2 tablets (500 mg total) by mouth once a week. 10/28/15   Arnetha Courser, MD  chlorthalidone (HYGROTON) 25 MG tablet Take 0.5-1 tablets (12.5-25 mg total) by mouth daily. 05/24/18   Arnetha Courser, MD  cholecalciferol (VITAMIN D) 1000 UNITS tablet Take 1,000 Units by mouth daily.    [provider]  doxycycline (VIBRA-TABS) 100 MG tablet Take 1 tablet (100 mg total) by mouth 2 (two) times daily. Patient not taking: Reported on 08/01/2018 05/14/18   Virgel Manifold, MD  EST ESTROGENS-METHYLTEST DS 1.25-2.5 MG TABS Take 1 tablet by mouth daily. 05/02/18   Lada, Satira Anis, MD  fish oil-omega-3 fatty acids 1000 MG capsule Take 2 g by mouth daily.    [provider]  gabapentin (NEURONTIN) 300 MG capsule Take 1 capsule (300 mg total) by mouth at bedtime. 08/02/18   Arnetha Courser, MD  Multiple Vitamin (MULTIVITAMIN) capsule Take 1 capsule by mouth daily.    [provider]  multivitamin-lutein (OCUVITE-LUTEIN) CAPS Take 1 capsule by mouth daily.    [provider]  OLIVE LEAF EXTRACT PO Take 1 tablet by mouth daily.    [provider]  omeprazole (PRILOSEC) 20 MG capsule Take 1 capsule (20 mg total) by mouth daily. 08/02/18   Virgel Manifold, MD  pantoprazole (PROTONIX) 20 MG tablet Take 1 tablet (20 mg total) by mouth 2 (two) times daily for 14 days. 05/14/18 05/28/18  Virgel Manifold, MD  pravastatin (PRAVACHOL) 20 MG tablet Take 1 tablet (20 mg total) by mouth daily. 04/06/18   Lada, Satira Anis, MD  tolterodine (DETROL LA) 4 MG 24 hr capsule Take 1 capsule (4 mg total) by mouth daily. 04/18/18   Arnetha Courser, MD   triamcinolone cream (KENALOG) 0.5 % Apply 1 application topically 3 (three) times daily. As needed 02/15/16   Lada, Satira Anis, MD  valACYclovir (VALTREX) 500 MG tablet Take 1 tablet (500 mg total) by mouth 2 (two) times daily. For three days in a row during outbreaks. 02/06/18   Arnetha Courser, MD      PHYSICAL EXAMINATION:   VITAL SIGNS: Blood pressure 112/72, pulse 99, temperature 98.9 F (37.2 C), temperature source Oral, resp. rate 17, height 5\' 4"  (1.626 m), weight 59 kg, SpO2 96 %.  GENERAL:  73 y.o.-year-old patient lying in  the bed with no acute distress.  EYES: Pupils equal, round, reactive to light and accommodation. No scleral icterus. Extraocular muscles intact.  HEENT: Head atraumatic, normocephalic. Oropharynx and nasopharynx clear.  NECK:  Supple, no jugular venous distention. No thyroid enlargement, no tenderness.  LUNGS: Decreased breath sounds bilaterally, no wheezing, no rales heard. No use of accessory muscles of respiration.  CARDIOVASCULAR: S1, S2 normal. No murmurs, rubs, or gallops.  ABDOMEN: Soft, nontender, nondistended. Bowel sounds present. No organomegaly or mass.  EXTREMITIES: No pedal edema, cyanosis, or clubbing.  NEUROLOGIC: Cranial nerves II through XII are intact. Muscle strength 5/5 in all extremities. Sensation intact. Gait not checked.  PSYCHIATRIC: The patient is alert and oriented x 3.  SKIN: No obvious rash, lesion, or ulcer.   LABORATORY PANEL:   CBC Recent Labs  Lab 08/16/18 1439  WBC 17.1*  HGB 15.0  HCT 43.1  PLT 158  MCV 91.3  MCH 31.8  MCHC 34.8  RDW 12.8  LYMPHSABS 0.6*  MONOABS 0.9  EOSABS 0.1  BASOSABS 0.0   ------------------------------------------------------------------------------------------------------------------  Chemistries  No results for input(s): NA, K, CL, CO2, GLUCOSE, BUN, CREATININE, CALCIUM, MG, AST, ALT, ALKPHOS, BILITOT in the last 168 hours.  Invalid input(s):  GFRCGP ------------------------------------------------------------------------------------------------------------------ CrCl cannot be calculated (Patient's most recent lab result is older than the maximum 21 days allowed.). ------------------------------------------------------------------------------------------------------------------ No results for input(s): TSH, T4TOTAL, T3FREE, THYROIDAB in the last 72 hours.  Invalid input(s): FREET3   Coagulation profile No results for input(s): INR, PROTIME in the last 168 hours. ------------------------------------------------------------------------------------------------------------------- No results for input(s): DDIMER in the last 72 hours. -------------------------------------------------------------------------------------------------------------------  Cardiac Enzymes No results for input(s): CKMB, TROPONINI, MYOGLOBIN in the last 168 hours.  Invalid input(s): CK ------------------------------------------------------------------------------------------------------------------ Invalid input(s): POCBNP  ---------------------------------------------------------------------------------------------------------------  Urinalysis    Component Value Date/Time   COLORURINE YELLOW 02/06/2018 1003   APPEARANCEUR CLEAR 02/06/2018 1003   APPEARANCEUR Clear 04/13/2015 1430   LABSPEC 1.012 02/06/2018 1003   PHURINE 7.0 02/06/2018 1003   GLUCOSEU NEGATIVE 02/06/2018 1003   HGBUR NEGATIVE 02/06/2018 1003   BILIRUBINUR Negative 05/24/2018 1205   BILIRUBINUR Negative 04/13/2015 1430   KETONESUR NEGATIVE 02/06/2018 1003   PROTEINUR Negative 05/24/2018 1205   PROTEINUR NEGATIVE 02/06/2018 1003   UROBILINOGEN negative (A) 05/24/2018 1205   NITRITE Negative 05/24/2018 1205   NITRITE CANCELED 02/15/2016 0551   LEUKOCYTESUR Negative 05/24/2018 1205   LEUKOCYTESUR Negative 04/13/2015 1430     RADIOLOGY: Dg Chest Portable 1 View  Result  Date: 08/16/2018 CLINICAL DATA:  Shortness of breath EXAM: PORTABLE CHEST 1 VIEW COMPARISON:  None. FINDINGS: Normal heart size and vascularity. Mild hyperinflation. No focal pneumonia, collapse or consolidation. Negative for edema, effusion or pneumothorax. Biapical pleural scarring noted. Trachea is midline. Aorta atherosclerotic. Degenerative changes of the spine with mild scoliosis. IMPRESSION: Mild hyperinflation.  No superimposed acute chest process. Electronically Signed   By: Jerilynn Mages.  Shick M.D.   On: 08/16/2018 15:11    EKG: Orders placed or performed during the hospital encounter of 08/16/18  . ED EKG  . ED EKG    IMPRESSION AND PLAN: 73 year old female patient with a known history of hyperlipidemia, hypertension, arthritis, osteopenia presented to the emergency room for shortness of breath, cough and runny nose and fever.  Symptoms have been going on for 1 week.  Patient recently traveled to [place] in the month of January and returned in February.  -Suspect novel coronavirus infection On 5liter oxygen via [employer] and contact precautions COVID-19 test has been sent Supportive care  Notify infection prevention team Infectious disease consult Flu test  -Sepsis Could be from viral syndrome IV fluids Empirical antibiotics Follow-up cultures and lactic acid levels  -Elevated d-dimer CTA chest to rule out pulmonary embolism  -Acute hypokalemia Replace potassium aggressively  -Elevated troponin Could be from demand ischemia Cardiac monitoring and cycle troponin  -DVT prophylaxis subcu Lovenox daily  -Leukocytosis Could be from viral syndrome versus sepsis Follow-up WBC count  All the records are reviewed and case discussed with ED provider. Management plans discussed with the patient, family and they are in agreement.  CODE STATUS:Full code Advance Directive Documentation     Most Recent Value  Type of Advance Directive  Healthcare Power of Attorney, Living will   Pre-existing out of facility DNR order (yellow form or pink MOST form)  -  "MOST" Form in Place?  -      TOTAL TIME TAKING CARE OF THIS PATIENT: 54 minutes.    Saundra Shelling M.D on 08/16/2018 at 4:01 PM  Between 7am to 6pm - Pager - [phone]  After 6pm go to [email or web address] - password [email or web address] [facility]  Office  [phone]  CC: Primary care physician; Arnetha Courser, MD

## 2018-08-16 NOTE — Progress Notes (Signed)
Advanced care plan. Purpose of the Encounter: CODE STATUS Parties in Attendance: Patient Patient's Decision Capacity: Good Subjective/Patient's story:  [name]  is a 73 y.o. female with a known history of hyperlipidemia, hypertension, arthritis, osteopenia presented to the emergency room for shortness of breath, cough and runny nose and fever.  Symptoms have been going on for 1 week.  Patient has fever as well as cough.  Cough is dry in nature.  Patient recently traveled to [place] in the month of January and returned back in the first week of February Objective/Medical story Patient was evaluated in the emergency room.  Lactic acid is elevated WBC count is elevated.  Chest x-ray did not reveal any infiltrates.  Has lymphopenia.  New coronavirus test has been sent out.  Patient needs airborne and contact isolation and work-up.  Needs IV fluids for dehydration.  Has elevated d-dimer needs CTA chest rule out pulmonary embolism. Goals of care determination:  Advance care directives goals of care and treatment plan discussed. Patient currently wants everything done which includes CPR, intubation ventilator if the need arises CODE STATUS: Full code Time spent discussing advanced care planning: 16 minutes

## 2018-08-16 NOTE — ED Triage Notes (Signed)
Pt arrived via ems with complaints of shortness of breath for 3 days. Pt arrives on non rebreather

## 2018-08-16 NOTE — Telephone Encounter (Signed)
  Pt daughter called and states that her mother has been sick and vomiting for several days.  She states she had labored breathing last night.  She has fever.  Per protocol daughter will call 911 for transport to ER.  Reason for Disposition . Severe difficulty breathing (e.g., struggling for each breath, speaks in single words)  Answer Assessment - Initial Assessment Questions 1. RESPIRATORY STATUS: "Describe your breathing?" (e.g., wheezing, shortness of breath, unable to speak, severe coughing)      Severe breathing issues per daughter 2. ONSET: "When did this breathing problem begin?"      Last nigt 3. PATTERN "Does the difficult breathing come and go, or has it been constant since it started?"      constant 4. SEVERITY: "How bad is your breathing?" (e.g., mild, moderate, severe)    - MILD: No SOB at rest, mild SOB with walking, speaks normally in sentences, can lay down, no retractions, pulse < 100.    - MODERATE: SOB at rest, SOB with minimal exertion and prefers to sit, cannot lie down flat, speaks in phrases, mild retractions, audible wheezing, pulse 100-120.    - SEVERE: Very SOB at rest, speaks in single words, struggling to breathe, sitting hunched forward, retractions, pulse > 120      SOB severe per daughter Joelene Millin 5. RECURRENT SYMPTOM: "Have you had difficulty breathing before?" If so, ask: "When was the last time?" and "What happened that time?"     no 6. CARDIAC HISTORY: "Do you have any history of heart disease?" (e.g., heart attack, angina, bypass surgery, angioplasty)      Unsure calling 911 7. LUNG HISTORY: "Do you have any history of lung disease?"  (e.g., pulmonary embolus, asthma, emphysema)     Unsure calling 911 8. CAUSE: "What do you think is causing the breathing problem?"      Unsure calling 911 9. OTHER SYMPTOMS: "Do you have any other symptoms? (e.g., dizziness, runny nose, cough, chest pain, fever)    Fever vomiting fatigue 10. PREGNANCY: "Is there any chance  you are pregnant?" "When was your last menstrual period?"     No 11. TRAVEL: "Have you traveled out of the country in the last month?" (e.g., travel history, exposures)      unsure  Protocols used: BREATHING DIFFICULTY-A-AH

## 2018-08-17 DIAGNOSIS — N179 Acute kidney failure, unspecified: Secondary | ICD-10-CM

## 2018-08-17 DIAGNOSIS — B9620 Unspecified Escherichia coli [E. coli] as the cause of diseases classified elsewhere: Secondary | ICD-10-CM

## 2018-08-17 DIAGNOSIS — R7881 Bacteremia: Secondary | ICD-10-CM

## 2018-08-17 LAB — BLOOD CULTURE ID PANEL (REFLEXED)

## 2018-08-17 LAB — BASIC METABOLIC PANEL
Anion gap: 8 (ref 5–15)
BUN: 38 mg/dL — ABNORMAL HIGH (ref 8–23)
CO2: 26 mmol/L (ref 22–32)
Calcium: 7 mg/dL — ABNORMAL LOW (ref 8.9–10.3)
Chloride: 101 mmol/L (ref 98–111)
Creatinine, Ser: 1.08 mg/dL — ABNORMAL HIGH (ref 0.44–1.00)
GFR calc Af Amer: 59 mL/min — ABNORMAL LOW (ref >60)
GFR calc non Af Amer: 51 mL/min — ABNORMAL LOW (ref >60)
Glucose, Bld: 144 mg/dL — ABNORMAL HIGH (ref 70–99)
Potassium: 2.9 mmol/L — ABNORMAL LOW (ref 3.5–5.1)
Sodium: 135 mmol/L (ref 135–145)

## 2018-08-17 LAB — CBC
HCT: 34.2 % — ABNORMAL LOW (ref 36.0–46.0)
Hemoglobin: 11.8 g/dL — ABNORMAL LOW (ref 12.0–15.0)
MCH: 31.6 pg (ref 26.0–34.0)
MCHC: 34.5 g/dL (ref 30.0–36.0)
MCV: 91.7 fL (ref 80.0–100.0)
Platelets: 106 10*3/uL — ABNORMAL LOW (ref 150–400)
RBC: 3.73 MIL/uL — ABNORMAL LOW (ref 3.87–5.11)
RDW: 13.1 % (ref 11.5–15.5)
WBC: 15.2 10*3/uL — ABNORMAL HIGH (ref 4.0–10.5)
nRBC: 0 % (ref 0.0–0.2)

## 2018-08-17 LAB — URINALYSIS, ROUTINE W REFLEX MICROSCOPIC
Bilirubin Urine: NEGATIVE
Glucose, UA: NEGATIVE mg/dL
Ketones, ur: NEGATIVE mg/dL
Nitrite: NEGATIVE
Protein, ur: 30 mg/dL — AB
Specific Gravity, Urine: 1.041 — ABNORMAL HIGH (ref 1.005–1.030)
pH: 6 (ref 5.0–8.0)

## 2018-08-17 LAB — MAGNESIUM: Magnesium: 2.3 mg/dL (ref 1.7–2.4)

## 2018-08-17 LAB — TROPONIN I
Troponin I: 0.1 ng/mL (ref <0.03)
Troponin I: 0.11 ng/mL (ref <0.03)

## 2018-08-17 LAB — MRSA PCR SCREENING: MRSA by PCR: NEGATIVE

## 2018-08-17 MED ORDER — CALCIUM GLUCONATE-NACL 1-0.675 GM/50ML-% IV SOLN
1.0000 g | Freq: Once | INTRAVENOUS | Status: AC
  Administered 2018-08-17: 22:00:00 1000 mg via INTRAVENOUS
  Filled 2018-08-17 (×2): qty 50

## 2018-08-17 MED ORDER — SODIUM CHLORIDE 0.9 % IV SOLN
INTRAVENOUS | Status: DC | PRN
  Administered 2018-08-17: 250 mL via INTRAVENOUS

## 2018-08-17 MED ORDER — SODIUM CHLORIDE 0.9 % IV SOLN
INTRAVENOUS | Status: DC | PRN
  Administered 2018-08-17 (×2): via INTRAVENOUS

## 2018-08-17 MED ORDER — SODIUM CHLORIDE 0.9 % IV SOLN
1.0000 g | Freq: Two times a day (BID) | INTRAVENOUS | Status: DC
  Administered 2018-08-17 – 2018-08-19 (×6): 1 g via INTRAVENOUS
  Filled 2018-08-17 (×7): qty 1

## 2018-08-17 MED ORDER — POTASSIUM CHLORIDE 20 MEQ PO PACK
60.0000 meq | PACK | Freq: Once | ORAL | Status: AC
  Administered 2018-08-17: 10:00:00 60 meq via ORAL
  Filled 2018-08-17: qty 3

## 2018-08-17 NOTE — Progress Notes (Signed)
[facility] [place] NAME: [name]    MR#:  [id number]  DATE OF BIRTH:  [birth year]  SUBJECTIVE:  CHIEF COMPLAINT:   Chief Complaint  Patient presents with  . Shortness of Breath  Patient is feeling better, no complaints, no events overnight per nursing staff  REVIEW OF SYSTEMS:  CONSTITUTIONAL: No fever, fatigue or weakness.  EYES: No blurred or double vision.  EARS, NOSE, AND THROAT: No tinnitus or ear pain.  RESPIRATORY: No cough, shortness of breath, wheezing or hemoptysis.  CARDIOVASCULAR: No chest pain, orthopnea, edema.  GASTROINTESTINAL: No nausea, vomiting, diarrhea or abdominal pain.  GENITOURINARY: No dysuria, hematuria.  ENDOCRINE: No polyuria, nocturia,  HEMATOLOGY: No anemia, easy bruising or bleeding SKIN: No rash or lesion. MUSCULOSKELETAL: No joint pain or arthritis.   NEUROLOGIC: No tingling, numbness, weakness.  PSYCHIATRY: No anxiety or depression.   ROS  DRUG ALLERGIES:   Allergies  Allergen Reactions  . Tizanidine Other (See Comments)    Unable to sleep.    VITALS:  Blood pressure 125/80, pulse 90, temperature 98.7 F (37.1 C), temperature source Oral, resp. rate 20, height 5\' 4"  (1.626 m), weight 61.6 kg, SpO2 100 %.  PHYSICAL EXAMINATION:  GENERAL:  73 y.o.-year-old patient lying in the bed with no acute distress.  EYES: Pupils equal, round, reactive to light and accommodation. No scleral icterus. Extraocular muscles intact.  HEENT: Head atraumatic, normocephalic. Oropharynx and nasopharynx clear.  NECK:  Supple, no jugular venous distention. No thyroid enlargement, no tenderness.  LUNGS: Normal breath sounds bilaterally, no wheezing, rales,rhonchi or crepitation. No use of accessory muscles of respiration.  CARDIOVASCULAR: S1, S2 normal. No murmurs, rubs, or gallops.  ABDOMEN: Soft, nontender, nondistended. Bowel sounds present. No organomegaly or mass.  EXTREMITIES: No pedal edema, cyanosis,  or clubbing.  NEUROLOGIC: Cranial nerves II through XII are intact. Muscle strength 5/5 in all extremities. Sensation intact. Gait not checked.  PSYCHIATRIC: The patient is alert and oriented x 3.  SKIN: No obvious rash, lesion, or ulcer.   Physical Exam LABORATORY PANEL:   CBC Recent Labs  Lab 08/17/18 0123  WBC 15.2*  HGB 11.8*  HCT 34.2*  PLT 106*   ------------------------------------------------------------------------------------------------------------------  Chemistries  Recent Labs  Lab 08/16/18 1439 08/17/18 0123 08/17/18 0844  NA 133* 135  --   K 2.8* 2.9*  --   CL 93* 101  --   CO2 25 26  --   GLUCOSE 145* 144*  --   BUN 47* 38*  --   CREATININE 1.42* 1.08*  --   CALCIUM 8.5* 7.0*  --   MG  --   --  2.3  AST 34  --   --   ALT 21  --   --   ALKPHOS 42  --   --   BILITOT 1.2  --   --    ------------------------------------------------------------------------------------------------------------------  Cardiac Enzymes Recent Labs  Lab 08/17/18 0123 08/17/18 0709  TROPONINI 0.10* 0.11*   ------------------------------------------------------------------------------------------------------------------  RADIOLOGY:  Ct Angio Chest Pe W And/or Wo Contrast  Result Date: 08/16/2018 CLINICAL DATA:  Progressively worsening shortness of breath over the past 3 days. Weakness and dry cough for the past week. Recent travel to [place]. EXAM: CT ANGIOGRAPHY CHEST WITH CONTRAST TECHNIQUE: Multidetector CT imaging of the chest was performed using the standard protocol during bolus administration of intravenous contrast. Multiplanar CT image reconstructions and MIPs were obtained to evaluate the vascular anatomy. CONTRAST:  45mL OMNIPAQUE IOHEXOL 350  MG/ML SOLN COMPARISON:  Chest x-ray from same day. FINDINGS: Cardiovascular: Satisfactory opacification of the pulmonary arteries to the segmental level. No evidence of pulmonary embolism. Normal heart size. No pericardial  effusion. No thoracic aortic aneurysm or dissection. Aortic arch atherosclerotic calcification. Mediastinum/Nodes: No enlarged mediastinal, hilar, or axillary lymph nodes. 9 mm hypodense nodule in the right thyroid lobe. The trachea and esophagus demonstrate no significant findings. Lungs/Pleura: Minimal dependent subsegmental atelectasis in the right greater than left lower lobes. No focal consolidation, pleural effusion, or pneumothorax. 5 mm pulmonary nodule in the right middle lobe (series 6, image 61). 4 mm pulmonary nodule in the right lower lobe (series 6, image 50). Biapical pleuroparenchymal scarring. Upper Abdomen: No acute abnormality. Musculoskeletal: No chest wall abnormality. No acute or significant osseous findings. Review of the MIP images confirms the above findings. IMPRESSION: 1. No evidence of pulmonary embolism. No acute intrathoracic process. 2. Two 4-5 mm pulmonary nodules in the right middle and lower lobes. No follow-up needed if patient is low-risk (and has no known or suspected primary neoplasm). Non-contrast chest CT can be considered in 12 months if patient is high-risk. This recommendation follows the consensus statement: Guidelines for Management of Incidental Pulmonary Nodules Detected on CT Images: From the Fleischner Society 2017; Radiology 2017; 284:228-243. 3.  Aortic atherosclerosis (ICD10-I70.0). Electronically Signed   By: Titus Dubin M.D.   On: 08/16/2018 17:51   Dg Chest Portable 1 View  Result Date: 08/16/2018 CLINICAL DATA:  Shortness of breath EXAM: PORTABLE CHEST 1 VIEW COMPARISON:  None. FINDINGS: Normal heart size and vascularity. Mild hyperinflation. No focal pneumonia, collapse or consolidation. Negative for edema, effusion or pneumothorax. Biapical pleural scarring noted. Trachea is midline. Aorta atherosclerotic. Degenerative changes of the spine with mild scoliosis. IMPRESSION: Mild hyperinflation.  No superimposed acute chest process. Electronically Signed    By: Jerilynn Mages.  Shick M.D.   On: 08/16/2018 15:11    ASSESSMENT AND PLAN:  73 year old female patient with a known history of hyperlipidemia, hypertension, arthritis, osteopenia presented to the emergency room for shortness of breath, cough and runny nose and fever.  Symptoms have been going on for 1 week.  Patient recently traveled to [place] in the month of January and returned in February.  *Acute sepsis  Secondary to E. coli bacteremia, with concern for coronavirus infection Continue sepsis protocol, empiric meropenem, follow-up on cultures, infectious disease/Dr. Steva Ready input greatly appreciated  *Acute E. coli bacteremia Repeat blood cultures, all other plans as stated above   *Acute suspect novel coronavirus infection Supplemental oxygen wean as tolerated, follow-up on COVID-19 testing, supportive care, infectious disease/Dr. Steva Ready input appreciated  Influenza test negative  *Acute Elevated d-dimer CTA chest negative for PE, most likely secondary to sepsis due to above, check lower extremity Dopplers for further evaluation  *Acute hypokalemia Replete with p.o. potassium, magnesium level normal, check BMP in the morning  *Acute elevated troponins Secondary to demand ischemia/sepsis Conservative medical management  *Acute kidney injury Most likely secondary to sepsis Resolved with IV fluids for rehydration  *Acute abnormal CT of the chest noted for pulmonary nodules Need to follow-up with pulmonology status post discharge for continued medical management/surveillance   Disposition pending clinical course   All the records are reviewed and case discussed with Care Management/Social Workerr. Management plans discussed with the patient, family and they are in agreement.  CODE STATUS: full  TOTAL TIME TAKING CARE OF THIS PATIENT: 35 minutes.     POSSIBLE D/C IN 2-4 DAYS, DEPENDING ON CLINICAL CONDITION.  Avel Peace Edge Mauger M.D on 08/17/2018   Between 7am to 6pm -  Pager - [phone]  After 6pm go to [email or web address] - password [email or web address] [facility]  Office  [phone]  CC: Primary care physician; Arnetha Courser, MD  Note: This dictation was prepared with Dragon dictation along with smaller phrase technology. Any transcriptional errors that result from this process are unintentional.

## 2018-08-17 NOTE — Consult Note (Signed)
Pharmacy Antibiotic Note  [name] is a 73 y.o. female admitted on 08/16/2018 with sepsis.  Pharmacy was initially consulted for Cefepime/Vancomycin dosing. Vancomycin was discontinued by ID and Cefepime was changed to ceftriaxone.   BCID now showing E. Coli. Ceftriaxone will be changed to meropenem. Pharmacy consulted for dosing.   Plan: Start meropenem 1g IV every 12 hours based on current CrCl <30ml/min.    Height: 5\' 4"  (162.6 cm) Weight: 135 lb 14.4 oz (61.6 kg) IBW/kg (Calculated) : 54.7  Temp (24hrs), Avg:100.1 F (37.8 C), Min:98.4 F (36.9 C), Max:103.7 F (39.8 C)  Recent Labs  Lab 08/16/18 1439 08/16/18 1645 08/16/18 1943 08/17/18 0123  WBC 17.1*  --   --  15.2*  CREATININE 1.42*  --   --  1.08*  LATICACIDVEN 3.1* 1.9 1.9  --     Estimated Creatinine Clearance: 40.7 mL/min (A) (by C-G formula based on SCr of 1.08 mg/dL (H)).    Allergies  Allergen Reactions  . Tizanidine Other (See Comments)    Unable to sleep.    Antimicrobials this admission: Azithromycin  4/2 >> 4/2  CTX 4/2 >> 4/2  Cefepime 4/2 >> 4/2 Vancomycin 4/2 >>4/2 Meropenem 4/3>>   Dose adjustments this admission: N/A  Microbiology results: 4/2  BCx: E. Coli  4/2  COVID: pending   Thank you for allowing pharmacy to be a part of this patient's care.  Pernell Dupre, PharmD, BCPS Clinical Pharmacist 08/17/2018 2:48 AM

## 2018-08-17 NOTE — Progress Notes (Addendum)
ID Pt says she is feeling  better Eating better and drinking fluids No fever BP 133/72 (BP Location: Left Arm)   Pulse (!) 55   Temp 99.6 F (37.6 C) (Oral)   Resp 20   Ht 5\' 4"  (1.626 m)   Wt 61.6 kg   LMP  (LMP Unknown)   SpO2 100%   BMI 23.33 kg/m   CBC Latest Ref Rng & Units 08/17/2018 08/16/2018 02/06/2018  WBC 4.0 - 10.5 K/uL 15.2(H) 17.1(H) 8.9  Hemoglobin 12.0 - 15.0 g/dL 11.8(L) 15.0 12.4  Hematocrit 36.0 - 46.0 % 34.2(L) 43.1 36.3  Platelets 150 - 400 K/uL 106(L) 158 316   CMP Latest Ref Rng & Units 08/17/2018 08/16/2018 04/23/2018  Glucose 70 - 99 mg/dL 144(H) 145(H) 71  BUN 8 - 23 mg/dL 38(H) 47(H) 21  Creatinine 0.44 - 1.00 mg/dL 1.08(H) 1.42(H) 0.68  Sodium 135 - 145 mmol/L 135 133(L) 140  Potassium 3.5 - 5.1 mmol/L 2.9(L) 2.8(L) 4.6  Chloride 98 - 111 mmol/L 101 93(L) 104  CO2 22 - 32 mmol/L 26 25 31   Calcium 8.9 - 10.3 mg/dL 7.0(L) 8.5(L) 9.4  Total Protein 6.5 - 8.1 g/dL - 7.3 -  Total Bilirubin 0.3 - 1.2 mg/dL - 1.2 -  Alkaline Phos 38 - 126 U/L - 42 -  AST 15 - 41 U/L - 34 -  ALT 0 - 44 U/L - 21 -   Impression  E.coli bacteremia likely source urine but she has no symptoms. She did says she had e.coli urosepsis last sept when she was traveling in [place] and had to be admitted to hospital. On reviewing car everywhere it was resistant to cipro, bactrim Await Susceptibilites Recommend post void bladder scan to look for any significant residue Also ultrasound kidneys to look for hydro, Continue Meropenem  AKI -  prerenal improving ID will follow peripherally this weekend Discussed with patient-

## 2018-08-17 NOTE — Plan of Care (Signed)
  Problem: Pain Managment: Goal: General experience of comfort will improve Outcome: Progressing   Problem: Safety: Goal: Ability to remain free from injury will improve Outcome: Progressing   Problem: Clinical Measurements: Goal: Diagnostic test results will improve Outcome: Progressing Goal: Signs and symptoms of infection will decrease Outcome: Progressing   Problem: Respiratory: Goal: Ability to maintain adequate ventilation will improve Outcome: Progressing

## 2018-08-17 NOTE — Progress Notes (Signed)
PHARMACY - PHYSICIAN COMMUNICATION CRITICAL VALUE ALERT - BLOOD CULTURE IDENTIFICATION (BCID)  [name] is an 74 y.o. female who presented to [facility] on 08/16/2018 with a chief complaint of SOB.   Assessment:  Patient being treated for sepsis and suspected COVID-19 infection. BCID now showing E. Coli.   Name of physician (or Provider) Contacted: Dr. Marcille Blanco   Current antibiotics: Ceftriaxone   Changes to prescribed antibiotics recommended:  Ceftriaxone will be changed to meropenem. ID is consulted.   Results for orders placed or performed during the hospital encounter of 08/16/18  Blood Culture ID Panel (Reflexed) (Collected: 08/16/2018  2:40 PM)  Result Value Ref Range   Enterococcus species NOT DETECTED NOT DETECTED   Listeria monocytogenes NOT DETECTED NOT DETECTED   Staphylococcus species NOT DETECTED NOT DETECTED   Staphylococcus aureus (BCID) NOT DETECTED NOT DETECTED   Streptococcus species NOT DETECTED NOT DETECTED   Streptococcus agalactiae NOT DETECTED NOT DETECTED   Streptococcus pneumoniae NOT DETECTED NOT DETECTED   Streptococcus pyogenes NOT DETECTED NOT DETECTED   Acinetobacter baumannii NOT DETECTED NOT DETECTED   Enterobacteriaceae species DETECTED (A) NOT DETECTED   Enterobacter cloacae complex NOT DETECTED NOT DETECTED   Escherichia coli DETECTED (A) NOT DETECTED   Klebsiella oxytoca NOT DETECTED NOT DETECTED   Klebsiella pneumoniae NOT DETECTED NOT DETECTED   Proteus species NOT DETECTED NOT DETECTED   Serratia marcescens NOT DETECTED NOT DETECTED   Carbapenem resistance NOT DETECTED NOT DETECTED   Haemophilus influenzae NOT DETECTED NOT DETECTED   Neisseria meningitidis NOT DETECTED NOT DETECTED   Pseudomonas aeruginosa NOT DETECTED NOT DETECTED   Candida albicans NOT DETECTED NOT DETECTED   Candida glabrata NOT DETECTED NOT DETECTED   Candida krusei NOT DETECTED NOT DETECTED   Candida parapsilosis NOT DETECTED NOT DETECTED   Candida tropicalis  NOT DETECTED NOT DETECTED    Pernell Dupre, PharmD, BCPS Clinical Pharmacist 08/17/2018 2:50 AM

## 2018-08-18 LAB — CBC
HCT: 33.3 % — ABNORMAL LOW (ref 36.0–46.0)
Hemoglobin: 11.6 g/dL — ABNORMAL LOW (ref 12.0–15.0)
MCH: 32 pg (ref 26.0–34.0)
MCHC: 34.8 g/dL (ref 30.0–36.0)
MCV: 92 fL (ref 80.0–100.0)
Platelets: 139 10*3/uL — ABNORMAL LOW (ref 150–400)
RBC: 3.62 MIL/uL — ABNORMAL LOW (ref 3.87–5.11)
RDW: 13.2 % (ref 11.5–15.5)
WBC: 9.4 10*3/uL (ref 4.0–10.5)
nRBC: 0 % (ref 0.0–0.2)

## 2018-08-18 LAB — BASIC METABOLIC PANEL
Anion gap: 7 (ref 5–15)
BUN: 25 mg/dL — ABNORMAL HIGH (ref 8–23)
CO2: 25 mmol/L (ref 22–32)
Calcium: 7 mg/dL — ABNORMAL LOW (ref 8.9–10.3)
Chloride: 104 mmol/L (ref 98–111)
Creatinine, Ser: 0.91 mg/dL (ref 0.44–1.00)
GFR calc Af Amer: >60 mL/min (ref >60)
GFR calc non Af Amer: >60 mL/min (ref >60)
Glucose, Bld: 107 mg/dL — ABNORMAL HIGH (ref 70–99)
Potassium: 3.2 mmol/L — ABNORMAL LOW (ref 3.5–5.1)
Sodium: 136 mmol/L (ref 135–145)

## 2018-08-18 LAB — URINE CULTURE: Culture: NO GROWTH

## 2018-08-18 NOTE — Progress Notes (Signed)
Pre void bladder scan was 240ml, patient voided, post void bladder scan showed 49ml

## 2018-08-18 NOTE — Plan of Care (Signed)
  Problem: Clinical Measurements: Goal: Ability to maintain clinical measurements within normal limits will improve Outcome: Progressing Goal: Diagnostic test results will improve Outcome: Progressing   

## 2018-08-18 NOTE — Progress Notes (Signed)
[facility] at [place] NAME: [name]    MR#:  [id number]  DATE OF BIRTH:  1945-12-25  SUBJECTIVE:   No acute events overnight, remains afebrile.  Blood cultures are positive for E. coli but sensitivities pending.  Respiratory status stable.  Still has some shortness of breath but is clinically not hypoxic.  REVIEW OF SYSTEMS:    Review of Systems  Constitutional: Negative for chills and fever.  HENT: Negative for congestion and tinnitus.   Eyes: Negative for blurred vision and double vision.  Respiratory: Negative for cough, shortness of breath and wheezing.   Cardiovascular: Negative for chest pain, orthopnea and PND.  Gastrointestinal: Negative for abdominal pain, diarrhea, nausea and vomiting.  Genitourinary: Negative for dysuria and hematuria.  Neurological: Negative for dizziness, sensory change and focal weakness.  All other systems reviewed and are negative.   Nutrition: Regular Tolerating Diet: Yes Tolerating PT: Ambulatory  DRUG ALLERGIES:   Allergies  Allergen Reactions  . Tizanidine Other (See Comments)    Unable to sleep.    VITALS:  Blood pressure 126/68, pulse 85, temperature 99.6 F (37.6 C), temperature source Oral, resp. rate 16, height 5\' 4"  (1.626 m), weight 61.6 kg, SpO2 96 %.  PHYSICAL EXAMINATION:   Physical Exam  GENERAL:  73 y.o.-year-old patient lying in bed in no acute distress.  EYES: Pupils equal, round, reactive to light and accommodation. No scleral icterus. Extraocular muscles intact.  HEENT: Head atraumatic, normocephalic. Oropharynx and nasopharynx clear.  NECK:  Supple, no jugular venous distention. No thyroid enlargement, no tenderness.  LUNGS: Normal breath sounds bilaterally, no wheezing, rales, rhonchi. No use of accessory muscles of respiration.  CARDIOVASCULAR: S1, S2 normal. No murmurs, rubs, or gallops.  ABDOMEN: Soft, nontender, nondistended. Bowel sounds present. No  organomegaly or mass.  EXTREMITIES: No cyanosis, clubbing or edema b/l.    NEUROLOGIC: Cranial nerves II through XII are intact. No focal Motor or sensory deficits b/l.   PSYCHIATRIC: The patient is alert and oriented x 3.  SKIN: No obvious rash, lesion, or ulcer.    LABORATORY PANEL:   CBC Recent Labs  Lab 08/18/18 0314  WBC 9.4  HGB 11.6*  HCT 33.3*  PLT 139*   ------------------------------------------------------------------------------------------------------------------  Chemistries  Recent Labs  Lab 08/16/18 1439  08/17/18 0844 08/18/18 0314  NA 133*   < >  --  136  K 2.8*   < >  --  3.2*  CL 93*   < >  --  104  CO2 25   < >  --  25  GLUCOSE 145*   < >  --  107*  BUN 47*   < >  --  25*  CREATININE 1.42*   < >  --  0.91  CALCIUM 8.5*   < >  --  7.0*  MG  --   --  2.3  --   AST 34  --   --   --   ALT 21  --   --   --   ALKPHOS 42  --   --   --   BILITOT 1.2  --   --   --    < > = values in this interval not displayed.   ------------------------------------------------------------------------------------------------------------------  Cardiac Enzymes Recent Labs  Lab 08/17/18 0709  TROPONINI 0.11*   ------------------------------------------------------------------------------------------------------------------  RADIOLOGY:  Ct Angio Chest Pe W And/or Wo Contrast  Result Date: 08/16/2018 CLINICAL DATA:  Progressively worsening  shortness of breath over the past 3 days. Weakness and dry cough for the past week. Recent travel to [place]. EXAM: CT ANGIOGRAPHY CHEST WITH CONTRAST TECHNIQUE: Multidetector CT imaging of the chest was performed using the standard protocol during bolus administration of intravenous contrast. Multiplanar CT image reconstructions and MIPs were obtained to evaluate the vascular anatomy. CONTRAST:  67mL OMNIPAQUE IOHEXOL 350 MG/ML SOLN COMPARISON:  Chest x-ray from same day. FINDINGS: Cardiovascular: Satisfactory opacification of the  pulmonary arteries to the segmental level. No evidence of pulmonary embolism. Normal heart size. No pericardial effusion. No thoracic aortic aneurysm or dissection. Aortic arch atherosclerotic calcification. Mediastinum/Nodes: No enlarged mediastinal, hilar, or axillary lymph nodes. 9 mm hypodense nodule in the right thyroid lobe. The trachea and esophagus demonstrate no significant findings. Lungs/Pleura: Minimal dependent subsegmental atelectasis in the right greater than left lower lobes. No focal consolidation, pleural effusion, or pneumothorax. 5 mm pulmonary nodule in the right middle lobe (series 6, image 61). 4 mm pulmonary nodule in the right lower lobe (series 6, image 50). Biapical pleuroparenchymal scarring. Upper Abdomen: No acute abnormality. Musculoskeletal: No chest wall abnormality. No acute or significant osseous findings. Review of the MIP images confirms the above findings. IMPRESSION: 1. No evidence of pulmonary embolism. No acute intrathoracic process. 2. Two 4-5 mm pulmonary nodules in the right middle and lower lobes. No follow-up needed if patient is low-risk (and has no known or suspected primary neoplasm). Non-contrast chest CT can be considered in 12 months if patient is high-risk. This recommendation follows the consensus statement: Guidelines for Management of Incidental Pulmonary Nodules Detected on CT Images: From the Fleischner Society 2017; Radiology 2017; 284:228-243. 3.  Aortic atherosclerosis (ICD10-I70.0). Electronically Signed   By: Titus Dubin M.D.   On: 08/16/2018 17:51   Dg Chest Portable 1 View  Result Date: 08/16/2018 CLINICAL DATA:  Shortness of breath EXAM: PORTABLE CHEST 1 VIEW COMPARISON:  None. FINDINGS: Normal heart size and vascularity. Mild hyperinflation. No focal pneumonia, collapse or consolidation. Negative for edema, effusion or pneumothorax. Biapical pleural scarring noted. Trachea is midline. Aorta atherosclerotic. Degenerative changes of the spine  with mild scoliosis. IMPRESSION: Mild hyperinflation.  No superimposed acute chest process. Electronically Signed   By: Jerilynn Mages.  Shick M.D.   On: 08/16/2018 15:11     ASSESSMENT AND PLAN:   73 year old female patientwith a known history ofhyperlipidemia, hypertension, arthritis, osteopenia presented to the emergency room for shortness of breath, cough and runny nose and fever.Symptoms have been going on for 1 week.Patient recently traveled to [place] in the month of January and returned in February.  1.  Sepsis- due to E. coli bacteremia.  Due to her recent travel history COVID-19 is being ruled out. -Patient's blood cultures are positive for E. coli but sensitivities are still pending. -Continue meropenem for now.  Appreciate infectious disease input.  2.  E. coli bacteremia- source of patient's sepsis.  Source of the bacteremia is unclear.  Patient denies any abdominal pain flank pain, nausea or vomiting. -Patient has had previous E. coli bacteremia last year which was secondary to UTI.  Patient's urinalysis here is not impressive. -Continue IV meropenem, follow sensitivities.  Patient will likely need 2 weeks of total therapy.  Appreciate ID input.  3.  Suspected COVID-19- patient had a recent travel history in January and returned in February from [place]. -Continue droplet, contact precautions.  Patient's influenza test was negative.  COVID-19 test still pending.  Respiratory status currently stable.  Patient is afebrile.  4.  Elevated troponin-likely in the setting of supply demand ischemia.  Troponins have not trended upwards. -We will DC telemetry.    5.  Essential hypertension-continue Lopressor.  6.  GERD-continue Protonix.  7.  Hyperlipidemia-continue Pravachol.  8.  Neuropathy-continue gabapentin.  9.  History of urinary incontinence-continue Toviaz.   All the records are reviewed and case discussed with Care Management/Social Worker. Management plans discussed with the  patient, family and they are in agreement.  CODE STATUS: Full Code  DVT Prophylaxis: Lovenox  TOTAL TIME TAKING CARE OF THIS PATIENT: 30 minutes.   POSSIBLE D/C IN-2 DAYS, DEPENDING ON CLINICAL CONDITION.   Henreitta Leber M.D on 08/18/2018 at 11:21 AM  Between 7am to 6pm - Pager - [phone]  After 6pm go to [email or web address] - Proofreader  [facility]  Office  [phone]  CC: Primary care physician; Arnetha Courser, MD

## 2018-08-19 LAB — CULTURE, BLOOD (ROUTINE X 2): Special Requests: ADEQUATE

## 2018-08-19 MED ORDER — CEFUROXIME AXETIL 500 MG PO TABS: 500.0000 mg | ORAL_TABLET | Freq: Two times a day (BID) | ORAL | 0 refills | Status: DC

## 2018-08-19 MED ORDER — CEFUROXIME AXETIL 250 MG PO TABS: 250.0000 mg | ORAL_TABLET | Freq: Two times a day (BID) | ORAL | 0 refills | Status: AC

## 2018-08-19 NOTE — Progress Notes (Signed)
Olin Hauser to be D/C'd Home per MD order.  Discussed prescription and follow up appointments with the patient. Prescription given to patient, medication list explained in detail. covid 19 information given to patient.  Pt verbalized understanding.  Allergies as of 08/19/2018      Reactions   Tizanidine Other (See Comments)   Unable to sleep.      Medication List    STOP taking these medications   chloroquine 250 MG tablet Commonly known as:  ARALEN   doxycycline 100 MG tablet Commonly known as:  VIBRA-TABS     TAKE these medications   Alfalfa 250 MG Tabs Take by mouth.   calcium carbonate 600 MG Tabs tablet Commonly known as:  OS-CAL Take 600 mg by mouth as needed.   cefUROXime 250 MG tablet Commonly known as:  CEFTIN Take 1 tablet (250 mg total) by mouth 2 (two) times daily with a meal for 10 days.   celecoxib 200 MG capsule Commonly known as:  CELEBREX Take 200 mg by mouth daily.   chlorthalidone 25 MG tablet Commonly known as:  HYGROTON Take 0.5-1 tablets (12.5-25 mg total) by mouth daily.   cholecalciferol 1000 units tablet Commonly known as:  VITAMIN D Take 1,000 Units by mouth daily.   Est Estrogens-Methyltest DS 1.25-2.5 MG Tabs Take 1 tablet by mouth daily.   fish oil-omega-3 fatty acids 1000 MG capsule Take 2 g by mouth daily.   gabapentin 300 MG capsule Commonly known as:  NEURONTIN Take 1 capsule (300 mg total) by mouth at bedtime.   multivitamin capsule Take 1 capsule by mouth daily.   multivitamin-lutein Caps capsule Take 1 capsule by mouth daily.   OLIVE LEAF EXTRACT PO Take 1 tablet by mouth daily.   omeprazole 20 MG capsule Commonly known as:  PriLOSEC Take 1 capsule (20 mg total) by mouth daily.   pantoprazole 20 MG tablet Commonly known as:  Protonix Take 1 tablet (20 mg total) by mouth 2 (two) times daily for 14 days.   pravastatin 20 MG tablet Commonly known as:  PRAVACHOL Take 1 tablet (20 mg total) by mouth daily.    tolterodine 4 MG 24 hr capsule Commonly known as:  DETROL LA Take 1 capsule (4 mg total) by mouth daily.   triamcinolone cream 0.5 % Commonly known as:  KENALOG Apply 1 application topically 3 (three) times daily. As needed   valACYclovir 500 MG tablet Commonly known as:  VALTREX Take 1 tablet (500 mg total) by mouth 2 (two) times daily. For three days in a row during outbreaks.       Vitals:   08/19/18 0444 08/19/18 0817  BP: 123/75 125/73  Pulse: 83 69  Resp: 16 19  Temp: 99.8 F (37.7 C) 98.3 F (36.8 C)  SpO2: 95% 98%    Skin clean, dry and intact without evidence of skin break down, no evidence of skin tears noted. IV catheter discontinued intact. Site without signs and symptoms of complications. Dressing and pressure applied. Pt denies pain at this time. No complaints noted.  An After Visit Summary was printed and given to the patient. Patient waiting on ride to be discharged home. [place]

## 2018-08-19 NOTE — Discharge Summary (Signed)
[place] [place] NAME: [name]    MR#:  [id number]  DATE OF BIRTH:  10-28-1945  DATE OF ADMISSION:  08/16/2018 ADMITTING PHYSICIAN: Saundra Shelling, MD  DATE OF DISCHARGE: 08/19/2018  PRIMARY CARE PHYSICIAN: Arnetha Courser, MD    ADMISSION DIAGNOSIS:  Acute respiratory failure with hypoxia ([place]) [J96.01] Atrial fibrillation with RVR ([place]) [I48.91] Sepsis with acute hypoxic respiratory failure without septic shock, due to unspecified organism ([place]) [A41.9, R65.20, J96.01]  DISCHARGE DIAGNOSIS:  Active Problems:   Sepsis ([place])   SECONDARY DIAGNOSIS:   Past Medical History:  Diagnosis Date  . Arthritis    in back  . Cataract 2011   remove from right eye  . Dyslipidemia, goal LDL below 130 10/28/2015  . Hormone replacement therapy (postmenopausal) 10/28/2015  . Hyperlipidemia   . Hypertension   . Osteopenia   . Patient is full code 12/05/2016  . TMJ (dislocation of temporomandibular joint)     HOSPITAL COURSE:   73 year old female patientwith a known history ofhyperlipidemia, hypertension, arthritis, osteopenia presented to the emergency room for shortness of breath, cough and runny nose and fever.Symptoms have been going on for 1 week.Patient recently traveled to [place] in the month of January and returned in February.  1.  Sepsis- due to E. coli bacteremia.  Due to her recent travel history COVID-19 is being ruled out and still pending.  -Patient's blood cultures were positive for E. coli which was pansensitive.  Initially treated with IV ceftriaxone and then switched over to IV meropenem and now being discharged on oral 10-day course of Ceftin.  She is currently afebrile and hemodynamically stable.  2.  E. coli bacteremia- source of patient's sepsis.  Source of the bacteremia remained unclear.  Patient denies any flank pain, nausea or vomiting. -Patient has had previous E. coli bacteremia last year which was  secondary to UTI.  Patient's urinalysis here is not impressive. -Initially patient was treated with IV ceftriaxone, then switched over to IV meropenem and now being discharged on oral Ceftin.  Patient's blood cultures were positive and the E. coli was fairly pansensitive.  She will finish a total of 2 weeks of treatment.  Her repeat blood cultures are currently negative.  3.  Suspected COVID-19- patient had a recent travel history in January and returned in February from [place]. -Patient's COVID-19 test is still pending can be further followed up as an outpatient.  She is clinically stable from the respiratory standpoint and is afebrile.  4.  Elevated troponin-likely in the setting of supply demand ischemia.  Troponins have not trended upwards and she was asymptomatic.     5.  Essential hypertension- she will continue Lopressor.  6.  GERD- she will cont. Omeprazole.   7.  Hyperlipidemia- she will continue Pravachol.  8.  Neuropathy- she will continue gabapentin.  9.  History of urinary incontinence-she will continue Toviaz.  DISCHARGE CONDITIONS:   Stable   CONSULTS OBTAINED:    DRUG ALLERGIES:   Allergies  Allergen Reactions  . Tizanidine Other (See Comments)    Unable to sleep.    DISCHARGE MEDICATIONS:   Allergies as of 08/19/2018      Reactions   Tizanidine Other (See Comments)   Unable to sleep.      Medication List    STOP taking these medications   chloroquine 250 MG tablet Commonly known as:  ARALEN   doxycycline 100 MG tablet Commonly known as:  VIBRA-TABS  TAKE these medications   Alfalfa 250 MG Tabs Take by mouth.   calcium carbonate 600 MG Tabs tablet Commonly known as:  OS-CAL Take 600 mg by mouth as needed.   cefUROXime 250 MG tablet Commonly known as:  CEFTIN Take 1 tablet (250 mg total) by mouth 2 (two) times daily with a meal for 10 days.   celecoxib 200 MG capsule Commonly known as:  CELEBREX Take 200 mg by mouth daily.    chlorthalidone 25 MG tablet Commonly known as:  HYGROTON Take 0.5-1 tablets (12.5-25 mg total) by mouth daily.   cholecalciferol 1000 units tablet Commonly known as:  VITAMIN D Take 1,000 Units by mouth daily.   Est Estrogens-Methyltest DS 1.25-2.5 MG Tabs Take 1 tablet by mouth daily.   fish oil-omega-3 fatty acids 1000 MG capsule Take 2 g by mouth daily.   gabapentin 300 MG capsule Commonly known as:  NEURONTIN Take 1 capsule (300 mg total) by mouth at bedtime.   multivitamin capsule Take 1 capsule by mouth daily.   multivitamin-lutein Caps capsule Take 1 capsule by mouth daily.   OLIVE LEAF EXTRACT PO Take 1 tablet by mouth daily.   omeprazole 20 MG capsule Commonly known as:  PriLOSEC Take 1 capsule (20 mg total) by mouth daily.   pantoprazole 20 MG tablet Commonly known as:  Protonix Take 1 tablet (20 mg total) by mouth 2 (two) times daily for 14 days.   pravastatin 20 MG tablet Commonly known as:  PRAVACHOL Take 1 tablet (20 mg total) by mouth daily.   tolterodine 4 MG 24 hr capsule Commonly known as:  DETROL LA Take 1 capsule (4 mg total) by mouth daily.   triamcinolone cream 0.5 % Commonly known as:  KENALOG Apply 1 application topically 3 (three) times daily. As needed   valACYclovir 500 MG tablet Commonly known as:  VALTREX Take 1 tablet (500 mg total) by mouth 2 (two) times daily. For three days in a row during outbreaks.         DISCHARGE INSTRUCTIONS:   DIET:  Cardiac diet  DISCHARGE CONDITION:  Stable  ACTIVITY:  Activity as tolerated  OXYGEN:  Home Oxygen: No.   Oxygen Delivery: room air  DISCHARGE LOCATION:  home   If you experience worsening of your admission symptoms, develop shortness of breath, life threatening emergency, suicidal or homicidal thoughts you must seek medical attention immediately by calling 911 or calling your MD immediately  if symptoms less severe.  You Must read complete instructions/literature along  with all the possible adverse reactions/side effects for all the Medicines you take and that have been prescribed to you. Take any new Medicines after you have completely understood and accpet all the possible adverse reactions/side effects.   Please note  You were cared for by a hospitalist during your hospital stay. If you have any questions about your discharge medications or the care you received while you were in the hospital after you are discharged, you can call the unit and asked to speak with the hospitalist on call if the hospitalist that took care of you is not available. Once you are discharged, your primary care physician will handle any further medical issues. Please note that NO REFILLS for any discharge medications will be authorized once you are discharged, as it is imperative that you return to your primary care physician (or establish a relationship with a primary care physician if you do not have one) for your aftercare needs so that they can  reassess your need for medications and monitor your lab values.     Today   No acute events overnight, afebrile, hemodynamically stable.  Sensitivities on the blood culture shows that E. coli is fairly pansensitive.  She denies any worsening shortness of breath fever or cough.  Will discharge home today.  VITAL SIGNS:  Blood pressure 125/73, pulse 69, temperature 98.3 F (36.8 C), temperature source Oral, resp. rate 19, height 5\' 4"  (1.626 m), weight 61.6 kg, SpO2 98 %.  I/O:    Intake/Output Summary (Last 24 hours) at 08/19/2018 1214 Last data filed at 08/19/2018 0554 Gross per 24 hour  Intake 1513.23 ml  Output 1490 ml  Net 23.23 ml    PHYSICAL EXAMINATION:  GENERAL:  73 y.o.-year-old patient lying in the bed with no acute distress.  EYES: Pupils equal, round, reactive to light and accommodation. No scleral icterus. Extraocular muscles intact.  HEENT: Head atraumatic, normocephalic. Oropharynx and nasopharynx clear.  NECK:   Supple, no jugular venous distention. No thyroid enlargement, no tenderness.  LUNGS: Normal breath sounds bilaterally, no wheezing, rales,rhonchi. No use of accessory muscles of respiration.  CARDIOVASCULAR: S1, S2 normal. No murmurs, rubs, or gallops.  ABDOMEN: Soft, non-tender, non-distended. Bowel sounds present. No organomegaly or mass.  EXTREMITIES: No pedal edema, cyanosis, or clubbing.  NEUROLOGIC: Cranial nerves II through XII are intact. No focal motor or sensory defecits b/l.  PSYCHIATRIC: The patient is alert and oriented x 3. SKIN: No obvious rash, lesion, or ulcer.   DATA REVIEW:   CBC Recent Labs  Lab 08/18/18 0314  WBC 9.4  HGB 11.6*  HCT 33.3*  PLT 139*    Chemistries  Recent Labs  Lab 08/16/18 1439  08/17/18 0844 08/18/18 0314  NA 133*   < >  --  136  K 2.8*   < >  --  3.2*  CL 93*   < >  --  104  CO2 25   < >  --  25  GLUCOSE 145*   < >  --  107*  BUN 47*   < >  --  25*  CREATININE 1.42*   < >  --  0.91  CALCIUM 8.5*   < >  --  7.0*  MG  --   --  2.3  --   AST 34  --   --   --   ALT 21  --   --   --   ALKPHOS 42  --   --   --   BILITOT 1.2  --   --   --    < > = values in this interval not displayed.    Cardiac Enzymes Recent Labs  Lab 08/17/18 0709  TROPONINI 0.11*    Microbiology Results  Results for orders placed or performed during the hospital encounter of 08/16/18  Blood culture (routine x 2)     Status: Abnormal   Collection Time: 08/16/18  2:39 PM  Result Value Ref Range Status   Specimen Description   Final    BLOOD RIGHT ANTECUBITAL Performed at [facility], [address] [place], [place] [address]    Special Requests   Final    BOTTLES DRAWN AEROBIC AND ANAEROBIC Blood Culture results may not be optimal due to an inadequate volume of blood received in culture bottles Performed at [facility], [address], [place], [place] [address]    Culture  Setup Time   Final    GRAM NEGATIVE  RODS AEROBIC  BOTTLE ONLY CRITICAL VALUE NOTED.  VALUE IS CONSISTENT WITH PREVIOUSLY REPORTED AND CALLED VALUE. Performed at [facility], [address] [place], [place] [address]    Culture (A)  Final    ESCHERICHIA COLI SUSCEPTIBILITIES PERFORMED ON PREVIOUS CULTURE WITHIN THE LAST 5 DAYS. Performed at [facility], [place] [address] [place], [place] [address]    Report Status 08/19/2018 FINAL  Final  Blood culture (routine x 2)     Status: Abnormal   Collection Time: 08/16/18  2:40 PM  Result Value Ref Range Status   Specimen Description   Final    BLOOD BLOOD LEFT ARM Performed at [facility], [address] [place], [place] [address]    Special Requests   Final    BOTTLES DRAWN AEROBIC AND ANAEROBIC Blood Culture adequate volume Performed at [facility], [address], [place], [place] [address]    Culture  Setup Time   Final    GRAM NEGATIVE RODS IN BOTH AEROBIC AND ANAEROBIC BOTTLES CRITICAL RESULT CALLED TO, READ BACK BY AND VERIFIED WITH: Janine Limbo HALLAJI 08/17/2018 0134 REC Performed at [facility], [place] [address] [place], [place] [address]    Culture ESCHERICHIA COLI (A)  Final   Report Status 08/19/2018 FINAL  Final   Organism ID, Bacteria ESCHERICHIA COLI  Final      Susceptibility   Escherichia coli - MIC*    AMPICILLIN <=2 SENSITIVE Sensitive     CEFAZOLIN <=4 SENSITIVE Sensitive     CEFEPIME <=1 SENSITIVE Sensitive     CEFTAZIDIME <=1 SENSITIVE Sensitive     CEFTRIAXONE <=1 SENSITIVE Sensitive     CIPROFLOXACIN <=0.25 SENSITIVE Sensitive     GENTAMICIN <=1 SENSITIVE Sensitive     IMIPENEM <=0.25 SENSITIVE Sensitive     TRIMETH/SULFA <=20 SENSITIVE Sensitive     AMPICILLIN/SULBACTAM <=2 SENSITIVE Sensitive     PIP/TAZO <=4 SENSITIVE Sensitive     Extended ESBL NEGATIVE Sensitive     * ESCHERICHIA COLI  Blood Culture ID Panel (Reflexed)     Status: Abnormal   Collection Time: 08/16/18  2:40 PM  Result Value Ref Range Status    Enterococcus species NOT DETECTED NOT DETECTED Final   Listeria monocytogenes NOT DETECTED NOT DETECTED Final   Staphylococcus species NOT DETECTED NOT DETECTED Final   Staphylococcus aureus (BCID) NOT DETECTED NOT DETECTED Final   Streptococcus species NOT DETECTED NOT DETECTED Final   Streptococcus agalactiae NOT DETECTED NOT DETECTED Final   Streptococcus pneumoniae NOT DETECTED NOT DETECTED Final   Streptococcus pyogenes NOT DETECTED NOT DETECTED Final   Acinetobacter baumannii NOT DETECTED NOT DETECTED Final   Enterobacteriaceae species DETECTED (A) NOT DETECTED Final    Comment: Enterobacteriaceae represent a large family of gram-negative bacteria, not a single organism. CRITICAL RESULT CALLED TO, READ BACK BY AND VERIFIED WITH: SHEEMA HALLAJI 08/17/2018 0134 REC    Enterobacter cloacae complex NOT DETECTED NOT DETECTED Final   Escherichia coli DETECTED (A) NOT DETECTED Final    Comment: CRITICAL RESULT CALLED TO, READ BACK BY AND VERIFIED WITH: SHEEMA HALLAJI 08/17/2018 0134 REC    Klebsiella oxytoca NOT DETECTED NOT DETECTED Final   Klebsiella pneumoniae NOT DETECTED NOT DETECTED Final   Proteus species NOT DETECTED NOT DETECTED Final   Serratia marcescens NOT DETECTED NOT DETECTED Final   Carbapenem resistance NOT DETECTED NOT DETECTED Final   Haemophilus influenzae NOT DETECTED NOT DETECTED Final   Neisseria meningitidis NOT DETECTED NOT DETECTED Final  Pseudomonas aeruginosa NOT DETECTED NOT DETECTED Final   Candida albicans NOT DETECTED NOT DETECTED Final   Candida glabrata NOT DETECTED NOT DETECTED Final   Candida krusei NOT DETECTED NOT DETECTED Final   Candida parapsilosis NOT DETECTED NOT DETECTED Final   Candida tropicalis NOT DETECTED NOT DETECTED Final    Comment: Performed at [facility], [address] [place], [place] [address]  MRSA PCR Screening     Status: None   Collection Time: 08/16/18 10:52 PM  Result Value Ref Range Status   MRSA by PCR  NEGATIVE NEGATIVE Final    Comment:        The GeneXpert MRSA Assay (FDA approved for NASAL specimens only), is one component of a comprehensive MRSA colonization surveillance program. It is not intended to diagnose MRSA infection nor to guide or monitor treatment for MRSA infections. Performed at [facility], [address] [place], [place] [address]   Urine Culture     Status: None   Collection Time: 08/17/18  1:51 AM  Result Value Ref Range Status   Specimen Description   Final    URINE, RANDOM Performed at [facility], [address] [place], [place] [address]    Special Requests   Final    NONE Performed at [facility], [address] [place], [place] [address]    Culture   Final    NO GROWTH Performed at [facility], [place] [address] [place], [place] [address]    Report Status 08/18/2018 FINAL  Final  CULTURE, BLOOD (ROUTINE X 2) w Reflex to ID Panel     Status: None (Preliminary result)   Collection Time: 08/17/18  8:44 AM  Result Value Ref Range Status   Specimen Description BLOOD RIGHT WRIST  Final   Special Requests   Final    BOTTLES DRAWN AEROBIC AND ANAEROBIC Blood Culture adequate volume   Culture   Final    NO GROWTH 2 DAYS Performed at [facility], [address] [place], [place] [address]    Report Status PENDING  Incomplete  CULTURE, BLOOD (ROUTINE X 2) w Reflex to ID Panel     Status: None (Preliminary result)   Collection Time: 08/17/18  8:44 AM  Result Value Ref Range Status   Specimen Description BLOOD BLOOD RIGHT HAND  Final   Special Requests   Final    BOTTLES DRAWN AEROBIC AND ANAEROBIC Blood Culture adequate volume   Culture   Final    NO GROWTH 2 DAYS Performed at [facility], [address] [place], [place] [address]    Report Status PENDING  Incomplete    RADIOLOGY:  No results found.    Management plans discussed with the patient, family and they are in  agreement.  CODE STATUS:     Code Status Orders  (From admission, onward)         Start     Ordered   08/16/18 1800  Full code  Continuous     08/16/18 1759          TOTAL TIME TAKING CARE OF THIS PATIENT: 40 minutes.    Henreitta Leber M.D on 08/19/2018 at 12:14 PM  Between 7am to 6pm - Pager - [phone]  After 6pm go to [email or web address] - Proofreader  [facility] [place] [facility]  Office  [phone]  CC: Primary care physician; Arnetha Courser, MD

## 2018-08-19 NOTE — Plan of Care (Signed)
  Problem: Education: Goal: Knowledge of General Education information will improve Description Including pain rating scale, medication(s)/side effects and non-pharmacologic comfort measures Outcome: Progressing   Problem: Clinical Measurements: Goal: Respiratory complications will improve Outcome: Progressing   Problem: Clinical Measurements: Goal: Signs and symptoms of infection will decrease Outcome: Progressing

## 2018-08-19 NOTE — Progress Notes (Signed)
Patient taken off the floor via wheelchair by staff. No distress noted.  

## 2018-08-20 ENCOUNTER — Telehealth: Payer: Self-pay

## 2018-08-20 LAB — NOVEL CORONAVIRUS, NAA (HOSP ORDER, SEND-OUT TO REF LAB; TAT 18-24 HRS): SARS-CoV-2, NAA: NOT DETECTED

## 2018-08-20 NOTE — Telephone Encounter (Signed)
Transition Care Management Follow-up Telephone Call  Date of discharge and from where: 08/19/18 [facility]  How have you been since you were released from the hospital? Pt states she is doing okay, no pain, just has no energy.  Any questions or concerns? No   Items Reviewed:  Did the pt receive and understand the discharge instructions provided? Yes   Medications obtained and verified? Yes   Any new allergies since your discharge? No   Dietary orders reviewed? Yes  Do you have support at home? Yes   Functional Questionnaire: (I = Independent and D = Dependent) ADLs: I  Bathing/Dressing- D  Meal Prep- D  Eating- I  Maintaining continence- I  Transferring/Ambulation- I  Managing Meds- I  Follow up appointments reviewed:   PCP Hospital f/u appt confirmed? No  Please advise patient if you would like to see in office or virtual visit for hospital follow up.   Are transportation arrangements needed? No   If their condition worsens, is the pt aware to call PCP or go to the Emergency Dept.? Yes  Was the patient provided with contact information for the PCP's office or ED? Yes  Was to pt encouraged to call back with questions or concerns? Yes

## 2018-08-20 NOTE — Telephone Encounter (Signed)
Pt states she is willing to do a WebEx visit. Please have front desk notify patient of date and time and how to access the visit once scheduled. Thank you!

## 2018-08-20 NOTE — Telephone Encounter (Signed)
LVM for pt to call the office and schedule a HFU appt

## 2018-08-20 NOTE — Telephone Encounter (Signed)
Ideal visit with NOT be in the office, but will be WebEx or other video based visit type Please make sure she has capability to do WebEx or one of the other approved video based visits prior to appt time  I am so thankful she is feeling better Thank you

## 2018-08-21 ENCOUNTER — Telehealth: Payer: Self-pay

## 2018-08-21 NOTE — Telephone Encounter (Signed)
SARS-CoV-2, NAA NOT DETECTED NOT DETECTED   Comment: Negative (Not Detected) results do not exclude infection caused by SARS CoV 2 and should not be used as the sole basis for treatment or other patient management decisions. Optimum specimen types and timing for peak viral levels during infections caused  by SARS CoV 2 have not been determined.  Collection of multiple specimens (types and time points) from the same patient may be necessary to detect the virus.    Testing shows she is negative, however it is important that she continue to follow any hospital discharge instructions and guidelines given to her for self-quarantine and social distancing.

## 2018-08-21 NOTE — Telephone Encounter (Signed)
Pt.notified

## 2018-08-21 NOTE — Telephone Encounter (Signed)
Copied from [place] [id number]. Topic: General - Other >> Aug 21, 2018  9:29 AM Leward Quan A wrote: Reason for CRM: Patient called to say that she was tested for [facility] virus on 08/16/2018 but she have not received a call with the results however her daughter was informed in the Ed on 08/20/2018 that the results were back. Patient is asking for a call from Dr Sanda Klein with the results please. Ph# [phone]

## 2018-08-22 ENCOUNTER — Other Ambulatory Visit: Payer: Self-pay

## 2018-08-22 LAB — CULTURE, BLOOD (ROUTINE X 2)
Culture: NO GROWTH
Culture: NO GROWTH
Special Requests: ADEQUATE
Special Requests: ADEQUATE

## 2018-08-22 NOTE — Patient Outreach (Signed)
[facility]) Care Management  08/22/2018  [name] [birth year] [id number]  EMMI: general discharge red alert Referral date: 08/22/18 Referral reason: unfilled prescriptions Insurance: [employer] team advantage Day # 1  Telephone call to patient regarding EMMI general discharge red alert. HIPAA verified with patient. Explained reason for call.  Patient states she has all of her medications and is taking them as prescribed. She states she has talked with her primary MD office since discharge from the hospital and has a telephone appointment with her primary MD within 1 week.  Patient denies any new symptoms or concerns.  She states she is sleeping, eating and feeling well.   Patient states she has transportation when needed for doctor appointments. Patient denies any further needs or concerns.  RNCM advised patient to notify MD of any changes in condition prior to scheduled appointment. RNCM provided contact name and number: 24 hour nurse advise line [phone].  RNCM verified patient aware of 911 services for urgent/ emergent needs.  PLAN: RNCM will close patient due to patient being assessed and having no further needs.   Quinn Plowman RN,BSN,CCM [facility] Telephonic  [phone]

## 2018-08-24 ENCOUNTER — Telehealth: Payer: Self-pay

## 2018-08-24 MED ORDER — FLUCONAZOLE 150 MG PO TABS: 150.0000 mg | ORAL_TABLET | Freq: Every day | ORAL | 0 refills | Status: DC

## 2018-08-24 NOTE — Telephone Encounter (Signed)
Left detailed voicemail

## 2018-08-24 NOTE — Telephone Encounter (Signed)
Copied from [place] [id number]. Topic: General - Other >> Aug 24, 2018 10:09 AM Carolyn Stare wrote:  Pt said she was in the hospital and was given an antbiotic and now she has a yeast infection and is asking if something can be called in For her

## 2018-08-24 NOTE — Telephone Encounter (Signed)
Yes, absolutely I sent in a prescription for diflucan Advise her to NOT take her cholesterol medicine (pravastatin) for days We hope she keeps getting better and better

## 2018-08-30 ENCOUNTER — Ambulatory Visit (INDEPENDENT_AMBULATORY_CARE_PROVIDER_SITE_OTHER): Payer: PPO | Admitting: Family Medicine

## 2018-08-30 ENCOUNTER — Encounter: Payer: Self-pay | Admitting: Family Medicine

## 2018-08-30 ENCOUNTER — Other Ambulatory Visit: Payer: Self-pay

## 2018-08-30 DIAGNOSIS — R319 Hematuria, unspecified: Secondary | ICD-10-CM

## 2018-08-30 DIAGNOSIS — E876 Hypokalemia: Secondary | ICD-10-CM | POA: Diagnosis not present

## 2018-08-30 DIAGNOSIS — R197 Diarrhea, unspecified: Secondary | ICD-10-CM

## 2018-08-30 DIAGNOSIS — B373 Candidiasis of vulva and vagina: Secondary | ICD-10-CM | POA: Diagnosis not present

## 2018-08-30 DIAGNOSIS — E785 Hyperlipidemia, unspecified: Secondary | ICD-10-CM

## 2018-08-30 DIAGNOSIS — I480 Paroxysmal atrial fibrillation: Secondary | ICD-10-CM | POA: Diagnosis not present

## 2018-08-30 DIAGNOSIS — R6520 Severe sepsis without septic shock: Secondary | ICD-10-CM | POA: Diagnosis not present

## 2018-08-30 DIAGNOSIS — R7989 Other specified abnormal findings of blood chemistry: Secondary | ICD-10-CM | POA: Diagnosis not present

## 2018-08-30 DIAGNOSIS — B3731 Acute candidiasis of vulva and vagina: Secondary | ICD-10-CM

## 2018-08-30 DIAGNOSIS — J9601 Acute respiratory failure with hypoxia: Secondary | ICD-10-CM

## 2018-08-30 DIAGNOSIS — R778 Other specified abnormalities of plasma proteins: Secondary | ICD-10-CM

## 2018-08-30 DIAGNOSIS — D696 Thrombocytopenia, unspecified: Secondary | ICD-10-CM

## 2018-08-30 DIAGNOSIS — A4151 Sepsis due to Escherichia coli [E. coli]: Secondary | ICD-10-CM | POA: Diagnosis not present

## 2018-08-30 DIAGNOSIS — D649 Anemia, unspecified: Secondary | ICD-10-CM | POA: Diagnosis not present

## 2018-08-30 MED ORDER — FLUCONAZOLE 150 MG PO TABS: 150.0000 mg | ORAL_TABLET | Freq: Every day | ORAL | 0 refills | Status: DC

## 2018-08-30 NOTE — Progress Notes (Signed)
LMP  (LMP Unknown)    Subjective:    Patient ID: [name], female    DOB: [birth year], 73 y.o.   MRN: [id number]  HPI: [name] is a 73 y.o. female  Chief Complaint  Patient presents with  . Follow-up    HPI Virtual Visit via Telephone/Video Note   I connected with the patient via:  Doximity video I verified that I am speaking with the correct person using two identifiers.  Call started: 1:32 pm Call terminated: 2:13 pm Total length of call: 41 minutes   I discussed the limitations, risks, and privacy concerns of performing an evaluation and management service by telephone and the availability of in-person appointments. I explained that he/she may be responsible for charges related to this service. The patient expressed understanding and agreed to proceed.  Provider location: home, upstairs office with door closed, earphones/headset on Patient location: home bedroom Additional participants: none  She was hospitalized, admitted on August 16, 2018 from the ER; acute respiratory failure; sepsis; discharged on August 19, 2018 I reviewed her hospital records during the visit and I explained abnormal labs and imaging Her blood cultures from 08/17/2018 were negative Her SARS-CoV-2 test was negative MRSA negative White blood count elevated, lactic acid elevated Anemic in the hospital Platelet count was low; no bleeding  She had been in the bed for 3 days before going to the hospital Troponin I was elevated, demand ischemia likely Gram negative rods, E coli; had UTI in September Then this; she wonders what to do to help her immune system She wants to know what to do to improve her overall health Sleeping well She does not think she is eating as much as she was, not as hungry Lost a little weight she thinks Not eating as well Will take a multivitamin  Reviewed all labs Pulmonary nodules, CT scan in one year Check COVID-19 Antibody test discussed in a few weeks  or when available  Urine culture did not show any growth No visible blood in the urine Family member with diarrhea She is stooling 2-3 today No fevers; no cough; no SHOB for at least for 3 days  She took a prescription of dilucan for yeast infection but thinks she needs another; she was on antibiotics during and after her hospital stay She was on a different cholesterol medicine Took a cholesterol medicine for a month and not sure when that was   Depression screen [facility] 2/9 05/24/2018 02/06/2018 01/02/2018 12/28/2017 12/12/2017  Decreased Interest 0 0 0 0 0  Down, Depressed, Hopeless 0 0 0 0 0  PHQ - 2 Score 0 0 0 0 0  Altered sleeping 0 0 0 0 -  Tired, decreased energy 0 0 0 0 -  Change in appetite 0 0 0 0 -  Feeling bad or failure about yourself  0 0 0 0 -  Trouble concentrating 0 0 0 0 -  Moving slowly or fidgety/restless 0 0 0 0 -  Suicidal thoughts 0 0 0 0 -  PHQ-9 Score 0 0 0 0 -  Difficult doing work/chores Not difficult at all Not difficult at all Not difficult at all Not difficult at all -   Fall Risk  08/30/2018 05/24/2018 04/23/2018 02/06/2018 01/29/2018  Falls in the past year? 0 0 0 No No  Number falls in past yr: - 0 - - -  Injury with Fall? - 0 - - -    Relevant past medical, surgical, family and  social history reviewed Past Medical History:  Diagnosis Date  . Arthritis    in back  . Cataract 2011   remove from right eye  . Dyslipidemia, goal LDL below 130 10/28/2015  . Hormone replacement therapy (postmenopausal) 10/28/2015  . Hyperlipidemia   . Hypertension   . Osteopenia   . Patient is full code 12/05/2016  . TMJ (dislocation of temporomandibular joint)    Past Surgical History:  Procedure Laterality Date  .  MESH SLING X 15 YRS    . ANTERIOR AND POSTERIOR REPAIR    . AUGMENTATION MAMMAPLASTY Bilateral    20 yrs ago , then replaced 5 yrs ago  . BLADDER SUSPENSION    . BREAST ENHANCEMENT SURGERY    . BREAST SURGERY  2012   BILATERAL BREAST IMPLANT REMOVAL  .  ESOPHAGOGASTRODUODENOSCOPY (EGD) WITH PROPOFOL N/A 05/04/2018   Procedure: ESOPHAGOGASTRODUODENOSCOPY (EGD) WITH PROPOFOL;  Surgeon: Virgel Manifold, MD;  Location: [facility] ENDOSCOPY;  Service: Endoscopy;  Laterality: N/A;  . EYE SURGERY  20114   right eye  . INCONTINENCE SURGERY    . NASAL SINUS SURGERY    . NOSE SURGERY    . VAGINAL HYSTERECTOMY     still has ovaries   Family History  Problem Relation Age of Onset  . Cancer Mother        COLON AND UTERINE  . Cancer Father        PROSTATE  . Diabetes Father   . Hypertension Father   . Stroke Father   . Arthritis Sister   . Diabetes Son   . Breast cancer Neg Hx    Social History   Tobacco Use  . Smoking status: Never Smoker  . Smokeless tobacco: Never Used  . Tobacco comment: smoking cessation materials not required  Substance Use Topics  . Alcohol use: No  . Drug use: No     Office Visit from 08/30/2018 in [facility]  AUDIT-C Score  0      Interim medical history since last visit reviewed. Allergies and medications reviewed  Review of Systems Per HPI unless specifically indicated above     Objective:    LMP  (LMP Unknown)   Wt Readings from Last 3 Encounters:  08/16/18 135 lb 14.4 oz (61.6 kg)  08/01/18 135 lb 6.4 oz (61.4 kg)  05/24/18 135 lb (61.2 kg)    Physical Exam Constitutional:      Appearance: Normal appearance.  Eyes:     Extraocular Movements: Extraocular movements intact.     Right eye: Normal extraocular motion.     Left eye: Normal extraocular motion.  Pulmonary:     Effort: No tachypnea or respiratory distress.  Skin:    Coloration: Skin is not jaundiced or pale.  Neurological:     Mental Status: She is alert.     Cranial Nerves: No dysarthria.  Psychiatric:        Mood and Affect: Mood is not anxious or depressed.        Speech: Speech is not rapid and pressured, delayed or slurred.        Assessment & Plan:   Problem List Items Addressed This Visit       Other   Sepsis ([place]) - Primary    Working diagnosis in the hospital; however, I am concerned that she may have had COVID-19; negative test result does not rule out infection; consider testing for antibodies when testing available; she is worried about her immune system with  two hospitalizations now several months apart; refer to urology and infectious disease      Relevant Orders   Ambulatory referral to Urology   Ambulatory referral to Infectious Disease   Dyslipidemia, goal LDL below 100    Patient has not been on statin; will check and then consider medicine; healthy eating encouraged      Relevant Orders   Lipid panel (Completed)    Other Visit Diagnoses    Hypocalcemia       noted in the hospital; recheck   Relevant Orders   VITAMIN D 25 Hydroxy (Vit-D Deficiency, Fractures) (Completed)   Phosphorus (Completed)   Anemia, unspecified type       noted in hospital; recheck   Relevant Orders   CBC with Differential/Platelet (Completed)   Thrombocytopenia (HCC)       Relevant Orders   CBC with Differential/Platelet (Completed)   Hypokalemia       noted in the hospital, recheck, replace as necessary   Relevant Orders   COMPLETE METABOLIC PANEL WITH GFR (Completed)   Elevated troponin       likely demand ischemia; however, will recheck to make sure back to normal   Relevant Orders   Troponin I (Completed)   Hematuria, unspecified type       noted in urine in hospital; not visible; recheck urine   Relevant Orders   Urinalysis w microscopic + reflex cultur (Completed)   Diarrhea, unspecified type       family member with diarrhea illness as well; recent antibiotics; check C diff and other GI pathogens   Relevant Orders   Gastrointestinal Pathogen Panel PCR (Completed)   Magnesium (Completed)   Paroxysmal atrial fibrillation (HCC)       Relevant Orders   TSH (Completed)   Vaginal yeast infection       Rx for diflucan (fluconazole) but do not take statin x 3 days; she  has been off statin anyway, will not start now   Relevant Medications   fluconazole (DIFLUCAN) 150 MG tablet       Follow up plan: No follow-ups on file.  An after-visit summary was printed and given to the patient at [place].  Please see the patient instructions which may contain other information and recommendations beyond what is mentioned above in the assessment and plan.  Meds ordered this encounter  Medications  . fluconazole (DIFLUCAN) 150 MG tablet    Sig: Take 1 tablet (150 mg total) by mouth daily. Do not take cholesterol medicine (pravastatin) for 3 days    Dispense:  1 tablet    Refill:  0    Orders Placed This Encounter  Procedures  . Urinalysis w microscopic + reflex cultur  . CBC with Differential/Platelet  . COMPLETE METABOLIC PANEL WITH GFR  . Lipid panel  . Troponin I  . VITAMIN D 25 Hydroxy (Vit-D Deficiency, Fractures)  . Phosphorus  . Gastrointestinal Pathogen Panel PCR  . Magnesium  . TSH  . Ambulatory referral to Urology  . Ambulatory referral to Infectious Disease

## 2018-08-31 ENCOUNTER — Other Ambulatory Visit: Payer: Self-pay

## 2018-08-31 ENCOUNTER — Telehealth: Payer: Self-pay | Admitting: Licensed Clinical Social Worker

## 2018-08-31 DIAGNOSIS — D649 Anemia, unspecified: Secondary | ICD-10-CM | POA: Diagnosis not present

## 2018-08-31 DIAGNOSIS — R197 Diarrhea, unspecified: Secondary | ICD-10-CM

## 2018-08-31 DIAGNOSIS — R319 Hematuria, unspecified: Secondary | ICD-10-CM | POA: Diagnosis not present

## 2018-08-31 DIAGNOSIS — R7989 Other specified abnormal findings of blood chemistry: Secondary | ICD-10-CM | POA: Diagnosis not present

## 2018-08-31 DIAGNOSIS — E876 Hypokalemia: Secondary | ICD-10-CM

## 2018-08-31 DIAGNOSIS — D696 Thrombocytopenia, unspecified: Secondary | ICD-10-CM

## 2018-08-31 DIAGNOSIS — I480 Paroxysmal atrial fibrillation: Secondary | ICD-10-CM

## 2018-08-31 DIAGNOSIS — E785 Hyperlipidemia, unspecified: Secondary | ICD-10-CM | POA: Diagnosis not present

## 2018-08-31 DIAGNOSIS — R778 Other specified abnormalities of plasma proteins: Secondary | ICD-10-CM

## 2018-08-31 NOTE — Telephone Encounter (Signed)
Nikki from [facility] called about scheduling a referral for this patient. I called the patient to let her know that since she seen Dr. Delaine Lame she didn't need to come in unless she was having new problems. Patient stated she is fine now but primary wanted to follow up from her infection of E-coli. Patient did do blood work this morning at her PCP. I sent Dr. Sanda Klein  a message stating that she could secure chat Dr. Delaine Lame to discuss anything further.

## 2018-09-02 LAB — CBC WITH DIFFERENTIAL/PLATELET
Absolute Monocytes: 504 cells/uL (ref 200–950)
Basophils Absolute: 53 cells/uL (ref 0–200)
Basophils Relative: 1 %
Eosinophils Absolute: 69 cells/uL (ref 15–500)
Eosinophils Relative: 1.3 %
HCT: 36.5 % (ref 35.0–45.0)
Hemoglobin: 12.5 g/dL (ref 11.7–15.5)
Lymphs Abs: 1203 cells/uL (ref 850–3900)
MCH: 32.4 pg (ref 27.0–33.0)
MCHC: 34.2 g/dL (ref 32.0–36.0)
MCV: 94.6 fL (ref 80.0–100.0)
MPV: 9.5 fL (ref 7.5–12.5)
Monocytes Relative: 9.5 %
Neutro Abs: 3472 cells/uL (ref 1500–7800)
Neutrophils Relative %: 65.5 %
Platelets: 417 10*3/uL — ABNORMAL HIGH (ref 140–400)
RBC: 3.86 10*6/uL (ref 3.80–5.10)
RDW: 12.3 % (ref 11.0–15.0)
Total Lymphocyte: 22.7 %
WBC: 5.3 10*3/uL (ref 3.8–10.8)

## 2018-09-02 LAB — COMPLETE METABOLIC PANEL WITH GFR
AG Ratio: 1.4 (calc) (ref 1.0–2.5)
ALT: 15 U/L (ref 6–29)
AST: 17 U/L (ref 10–35)
Albumin: 3.7 g/dL (ref 3.6–5.1)
Alkaline phosphatase (APISO): 52 U/L (ref 37–153)
BUN/Creatinine Ratio: 33 (calc) — ABNORMAL HIGH (ref 6–22)
BUN: 30 mg/dL — ABNORMAL HIGH (ref 7–25)
CO2: 34 mmol/L — ABNORMAL HIGH (ref 20–32)
Calcium: 9.5 mg/dL (ref 8.6–10.4)
Chloride: 98 mmol/L (ref 98–110)
Creat: 0.92 mg/dL (ref 0.60–0.93)
GFR, Est African American: 72 mL/min/{1.73_m2} (ref >=60)
GFR, Est Non African American: 62 mL/min/{1.73_m2} (ref >=60)
Globulin: 2.6 g/dL (calc) (ref 1.9–3.7)
Glucose, Bld: 78 mg/dL (ref 65–99)
Potassium: 4 mmol/L (ref 3.5–5.3)
Sodium: 138 mmol/L (ref 135–146)
Total Bilirubin: 0.4 mg/dL (ref 0.2–1.2)
Total Protein: 6.3 g/dL (ref 6.1–8.1)

## 2018-09-02 LAB — URINE CULTURE
MICRO NUMBER:: 406026
Result:: NO GROWTH
SPECIMEN QUALITY:: ADEQUATE

## 2018-09-02 LAB — VITAMIN D 25 HYDROXY (VIT D DEFICIENCY, FRACTURES): Vit D, 25-Hydroxy: 44 ng/mL (ref 30–100)

## 2018-09-02 LAB — URINALYSIS W MICROSCOPIC + REFLEX CULTURE
Bacteria, UA: NONE SEEN /HPF
Bilirubin Urine: NEGATIVE
Glucose, UA: NEGATIVE
Hgb urine dipstick: NEGATIVE
Hyaline Cast: NONE SEEN /LPF
Ketones, ur: NEGATIVE
Nitrites, Initial: NEGATIVE
Protein, ur: NEGATIVE
Specific Gravity, Urine: 1.016 (ref 1.001–1.03)
pH: 6.5 (ref 5.0–8.0)

## 2018-09-02 LAB — LIPID PANEL
Cholesterol: 177 mg/dL (ref <200)
HDL: 39 mg/dL — ABNORMAL LOW (ref >=50)
LDL Cholesterol (Calc): 114 mg/dL (calc) — ABNORMAL HIGH
Non-HDL Cholesterol (Calc): 138 mg/dL (calc) — ABNORMAL HIGH (ref <130)
Total CHOL/HDL Ratio: 4.5 (calc) (ref <5.0)
Triglycerides: 126 mg/dL (ref <150)

## 2018-09-02 LAB — MAGNESIUM: Magnesium: 1.8 mg/dL (ref 1.5–2.5)

## 2018-09-02 LAB — TROPONIN I: Troponin I: <0.01 ng/mL (ref <=0.0)

## 2018-09-02 LAB — TSH: TSH: 0.48 mIU/L (ref 0.40–4.50)

## 2018-09-02 LAB — CULTURE INDICATED

## 2018-09-02 LAB — PHOSPHORUS: Phosphorus: 3.2 mg/dL (ref 2.1–4.3)

## 2018-09-03 DIAGNOSIS — R319 Hematuria, unspecified: Secondary | ICD-10-CM | POA: Diagnosis not present

## 2018-09-03 DIAGNOSIS — D649 Anemia, unspecified: Secondary | ICD-10-CM | POA: Diagnosis not present

## 2018-09-03 DIAGNOSIS — D696 Thrombocytopenia, unspecified: Secondary | ICD-10-CM | POA: Diagnosis not present

## 2018-09-03 DIAGNOSIS — E876 Hypokalemia: Secondary | ICD-10-CM | POA: Diagnosis not present

## 2018-09-04 ENCOUNTER — Encounter: Payer: Self-pay | Admitting: Family Medicine

## 2018-09-04 DIAGNOSIS — D75839 Thrombocytosis, unspecified: Secondary | ICD-10-CM

## 2018-09-04 DIAGNOSIS — D473 Essential (hemorrhagic) thrombocythemia: Secondary | ICD-10-CM

## 2018-09-04 DIAGNOSIS — R799 Abnormal finding of blood chemistry, unspecified: Secondary | ICD-10-CM

## 2018-09-04 LAB — GASTROINTESTINAL PATHOGEN PANEL PCR
C. difficile Tox A/B, PCR: NOT DETECTED
Campylobacter, PCR: NOT DETECTED
Cryptosporidium, PCR: NOT DETECTED
E coli (ETEC) LT/ST PCR: NOT DETECTED
E coli (STEC) stx1/stx2, PCR: NOT DETECTED
E coli 0157, PCR: NOT DETECTED
Giardia lamblia, PCR: NOT DETECTED
Norovirus, PCR: NOT DETECTED
Rotavirus A, PCR: NOT DETECTED
Salmonella, PCR: NOT DETECTED
Shigella, PCR: NOT DETECTED

## 2018-09-05 NOTE — Assessment & Plan Note (Signed)
Patient has not been on statin; will check and then consider medicine; healthy eating encouraged

## 2018-09-05 NOTE — Assessment & Plan Note (Signed)
Working diagnosis in the hospital; however, I am concerned that she may have had COVID-19; negative test result does not rule out infection; consider testing for antibodies when testing available; she is worried about her immune system with two hospitalizations now several months apart; refer to urology and infectious disease

## 2018-09-12 ENCOUNTER — Telehealth: Payer: Self-pay

## 2018-09-12 ENCOUNTER — Telehealth: Payer: Self-pay | Admitting: Gastroenterology

## 2018-09-12 ENCOUNTER — Encounter: Payer: Self-pay | Admitting: Family Medicine

## 2018-09-12 ENCOUNTER — Other Ambulatory Visit: Payer: Self-pay

## 2018-09-12 DIAGNOSIS — Z800 Family history of malignant neoplasm of digestive organs: Secondary | ICD-10-CM

## 2018-09-12 DIAGNOSIS — Z1211 Encounter for screening for malignant neoplasm of colon: Secondary | ICD-10-CM

## 2018-09-12 MED ORDER — OMEPRAZOLE 20 MG PO CPDR: 20.0000 mg | DELAYED_RELEASE_CAPSULE | Freq: Every day | ORAL | 0 refills | Status: DC

## 2018-09-12 MED ORDER — NA SULFATE-K SULFATE-MG SULF 17.5-3.13-1.6 GM/177ML PO SOLN: 1.0000 | Freq: Once | ORAL | 0 refills | Status: AC

## 2018-09-12 MED ORDER — FAMOTIDINE 20 MG PO TABS: 20.0000 mg | ORAL_TABLET | Freq: Every day | ORAL | 0 refills | Status: AC

## 2018-09-12 NOTE — Telephone Encounter (Signed)
Pt is calling she just spoke with Jackelyn Poling and Jackelyn Poling mentioned a colonoscopy in July pt is calling she doesn't have that on her calender please call pt to clarify

## 2018-09-12 NOTE — Telephone Encounter (Signed)
Pt informed that Dr. Bonna Gains prefers pt take pepcid 20mg  daily instead of omeprazole due to the long term side effects.  When I contacted the pharmacy they said that pepcid is unavailable due to demand when trying to get it. They suggested that the bigger pharmacy chains may have it. Dr. Bonna Gains said that due to the pepcid shortage that pt could take Omeprazole x30 days or if possible buy over the counter. Pt states they gave her tums in the hospital and she has not experienced any digestive problems. I informed her to try doing without and see if she has any symptoms of indigestion. If so take the rest of her omeprazole (has 3 weeks worth) and/or pepcid if available. Pt voiced understanding. To contact office if needed.

## 2018-09-12 NOTE — Telephone Encounter (Signed)
Pt notified that 90 day supply sent to pharmacy.

## 2018-09-12 NOTE — Telephone Encounter (Signed)
Patient called & needs a refill on omeprazole (PRILOSEC) 20 MG capsule if Dr T still wants her to take. She reports they are working no acid reflux while taking them. She would like a 90 day supply called into [facility] in [place].

## 2018-09-12 NOTE — Telephone Encounter (Signed)
Pt notified that this was a recommendation from Dr. Bonna Gains, that her colonoscopy be repeated in July 2020 due to [facility] of colon cancer. We have scheduled this for 11/28/2018 at [facility]. Will send in Rx for suprep, referral to [facility] and prep instructions via My Chart to pt and she is to contact office for any questions or concerns.

## 2018-09-12 NOTE — Telephone Encounter (Signed)
Telemed in 3-4 weeks

## 2018-09-13 ENCOUNTER — Telehealth: Payer: Self-pay | Admitting: Gastroenterology

## 2018-09-13 NOTE — Telephone Encounter (Signed)
Pt is scheduled for Video apt 3-4 week f/u she received her instructions and would like a call from [facility] for some questions please call pt

## 2018-09-13 NOTE — Telephone Encounter (Signed)
Front desk notifed to call and set up 3-4 week f/u appt.

## 2018-09-17 ENCOUNTER — Ambulatory Visit (INDEPENDENT_AMBULATORY_CARE_PROVIDER_SITE_OTHER): Payer: PPO | Admitting: Nurse Practitioner

## 2018-09-17 ENCOUNTER — Encounter: Payer: Self-pay | Admitting: Nurse Practitioner

## 2018-09-17 ENCOUNTER — Other Ambulatory Visit: Payer: Self-pay

## 2018-09-17 VITALS — Temp 98.0°F

## 2018-09-17 DIAGNOSIS — R5383 Other fatigue: Secondary | ICD-10-CM | POA: Diagnosis not present

## 2018-09-17 NOTE — Telephone Encounter (Signed)
Pt wanted prep instructions clarified. Went over low fiber diet and clear liquids. Pt voiced understanding.

## 2018-09-17 NOTE — Progress Notes (Signed)
Virtual Visit via Video Note  I connected with [name] on 09/17/18 at 10:20 AM EDT by a video enabled telemedicine application and verified that I am speaking with the correct person using two identifiers.   Staff discussed the limitations of evaluation and management by telemedicine and the availability of in person appointments. The patient expressed understanding and agreed to proceed.  Patient location: home  My location: work office Other people present: none HPI  Patient was hospitalized last month for sepsis due to E.Coli. Saw Dr. Sanda Klein for hospital follow-up on 08/30/2018. Patient noted extreme fatigue was difficult to get out of bed and started running a fever and was sent to ER. Tested negative for COVID19 but positive for E.coli. States she still has some fatigue when she exerts herself. It is improving and she is able to be active for longer periods but not back to her baseline. States is out in the garden for about 10 minutes then has to take a break. States last week was extremely exhausted after activities but improved now.   Denies fevers, chills, body aches, sore throat, cough, shortness of breath, dysuria. Rashes.   PHQ2/9: Depression screen [facility] 2/9 09/17/2018 05/24/2018 02/06/2018 01/02/2018 12/28/2017  Decreased Interest 0 0 0 0 0  Down, Depressed, Hopeless 0 0 0 0 0  PHQ - 2 Score 0 0 0 0 0  Altered sleeping 0 0 0 0 0  Tired, decreased energy 0 0 0 0 0  Change in appetite 0 0 0 0 0  Feeling bad or failure about yourself  0 0 0 0 0  Trouble concentrating 0 0 0 0 0  Moving slowly or fidgety/restless 0 0 0 0 0  Suicidal thoughts 0 0 0 0 0  PHQ-9 Score 0 0 0 0 0  Difficult doing work/chores Not difficult at all Not difficult at all Not difficult at all Not difficult at all Not difficult at all     PHQ reviewed. Negative  Patient Active Problem List   Diagnosis Date Noted  . Sepsis ([place]) 08/16/2018  . Essential hypertension, benign 05/24/2018  . CLE (columnar lined  esophagus)   . H. pylori infection   . Heartburn   . Osteopenia 12/12/2017  . Hormone replacement therapy (HRT) 12/12/2017  . Patient is full code 12/05/2016  . Insomnia 04/10/2016  . Hypertensive retinopathy 02/15/2016  . Urine incontinence 02/15/2016  . Breast cancer screening 10/28/2015  . Medication monitoring encounter 10/28/2015  . Dyslipidemia, goal LDL below 100 10/28/2015  . Hormone replacement therapy (postmenopausal) 10/28/2015  . Preventative health care 10/28/2015  . Post-menopausal 10/07/2015  . Diarrhea, travelers' 09/08/2015  . 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 Dermatitis 05/15/2015    Past Medical History:  Diagnosis Date  . Arthritis    in back  . Cataract 2011   remove from right eye  . Dyslipidemia, goal LDL below 130 10/28/2015  . Hormone replacement therapy (postmenopausal) 10/28/2015  . Hyperlipidemia   . Hypertension   . Osteopenia   . Patient is full code 12/05/2016  . TMJ (dislocation of temporomandibular joint)     Past Surgical History:  Procedure Laterality Date  .  MESH SLING X 15 YRS    . ANTERIOR AND POSTERIOR REPAIR    . AUGMENTATION MAMMAPLASTY Bilateral    20 yrs ago , then replaced 5 yrs ago  . BLADDER SUSPENSION    . BREAST ENHANCEMENT SURGERY    . BREAST SURGERY  2012   BILATERAL BREAST IMPLANT REMOVAL  .  ESOPHAGOGASTRODUODENOSCOPY (EGD) WITH PROPOFOL N/A 05/04/2018   Procedure: ESOPHAGOGASTRODUODENOSCOPY (EGD) WITH PROPOFOL;  Surgeon: Virgel Manifold, MD;  Location: [facility] ENDOSCOPY;  Service: Endoscopy;  Laterality: N/A;  . EYE SURGERY  20114   right eye  . INCONTINENCE SURGERY    . NASAL SINUS SURGERY    . NOSE SURGERY    . VAGINAL HYSTERECTOMY     still has ovaries    Social History   Tobacco Use  . Smoking status: Never Smoker  . Smokeless tobacco: Never Used  . Tobacco comment: smoking cessation materials not required  Substance Use Topics  . Alcohol use: No     Current Outpatient Medications:  .   celecoxib (CELEBREX) 200 MG capsule, Take 200 mg by mouth daily. , Disp: , Rfl:  .  chlorthalidone (HYGROTON) 25 MG tablet, Take 0.5-1 tablets (12.5-25 mg total) by mouth daily., Disp: 30 tablet, Rfl: 5 .  cholecalciferol (VITAMIN D) 1000 UNITS tablet, Take 1,000 Units by mouth daily., Disp: , Rfl:  .  EST ESTROGENS-METHYLTEST DS 1.25-2.5 MG TABS, Take 1 tablet by mouth daily., Disp: 90 each, Rfl: 2 .  fish oil-omega-3 fatty acids 1000 MG capsule, Take 2 g by mouth daily., Disp: , Rfl:  .  gabapentin (NEURONTIN) 300 MG capsule, Take 1 capsule (300 mg total) by mouth at bedtime., Disp: 90 capsule, Rfl: 1 .  Multiple Vitamin (MULTIVITAMIN) capsule, Take 1 capsule by mouth daily., Disp: , Rfl:  .  OLIVE LEAF EXTRACT PO, Take 1 tablet by mouth daily., Disp: , Rfl:  .  tolterodine (DETROL LA) 4 MG 24 hr capsule, Take 1 capsule (4 mg total) by mouth daily., Disp: 90 capsule, Rfl: 1 .  triamcinolone cream (KENALOG) 0.5 %, Apply 1 application topically 3 (three) times daily. As needed, Disp: 30 g, Rfl: 0 .  valACYclovir (VALTREX) 500 MG tablet, Take 1 tablet (500 mg total) by mouth 2 (two) times daily. For three days in a row during outbreaks., Disp: 20 tablet, Rfl: 0 .  calcium carbonate (OS-CAL) 600 MG TABS, Take 600 mg by mouth as needed. , Disp: , Rfl:  .  famotidine (PEPCID) 20 MG tablet, Take 1 tablet (20 mg total) by mouth daily. (Patient not taking: Reported on 09/17/2018), Disp: 90 tablet, Rfl: 0 .  fluconazole (DIFLUCAN) 150 MG tablet, Take 1 tablet (150 mg total) by mouth daily. Do not take cholesterol medicine (pravastatin) for 3 days, Disp: 1 tablet, Rfl: 0 .  SUPREP BOWEL PREP KIT 17.5-3.13-1.6 GM/177ML SOLN, , Disp: , Rfl:   Allergies  Allergen Reactions  . Tizanidine Other (See Comments)    Unable to sleep.    ROS    No other specific complaints in a complete review of systems (except as listed in HPI above).  Objective  Vitals:   09/17/18 1015  Temp: 98 F (36.7 C)  TempSrc:  Oral     There is no height or weight on file to calculate BMI.  Nursing Note and Vital Signs reviewed.  Physical Exam   Constitutional: Patient appears well-developed and well-nourished. No distress.  HENT: Head: Normocephalic and atraumatic. Pulmonary/Chest: Effort normal  Musculoskeletal: Normal range of motion,  Neurological: he is alert and oriented to person, place, and time. speech and gait are normal.  Skin: No rash noted. No erythema.  Psychiatric: Patient has a normal mood and affect. behavior is normal. Judgment and thought content normal.    Assessment & Plan 1. Fatigue, unspecified type Has been improving but slowly and  patient states is still not back to her baseline. She will call infectious disease back to set up an appointment and come in tomorrow to recheck labs and get a full set of vital, no other suspicious signs or symptoms presently. Discussed nutrition, hydration, rest and slowly increasing activity.      Follow Up Instructions: Routine set up, come in sooner if fatigue is not improving or develops new or worsening symptoms.    I discussed the assessment and treatment plan with the patient. The patient was provided an opportunity to ask questions and all were answered. The patient agreed with the plan and demonstrated an understanding of the instructions.   The patient was advised to call back or seek an in-person evaluation if the symptoms worsen or if the condition fails to improve as anticipated.  I provided 18 minutes of non-face-to-face time during this encounter.   Fredderick Severance, NP

## 2018-09-18 ENCOUNTER — Ambulatory Visit: Payer: PPO

## 2018-09-18 ENCOUNTER — Other Ambulatory Visit: Payer: Self-pay

## 2018-09-18 ENCOUNTER — Telehealth: Payer: Self-pay

## 2018-09-18 DIAGNOSIS — R799 Abnormal finding of blood chemistry, unspecified: Secondary | ICD-10-CM | POA: Diagnosis not present

## 2018-09-18 DIAGNOSIS — D75839 Thrombocytosis, unspecified: Secondary | ICD-10-CM

## 2018-09-18 DIAGNOSIS — D473 Essential (hemorrhagic) thrombocythemia: Secondary | ICD-10-CM

## 2018-09-18 LAB — CBC WITH DIFFERENTIAL/PLATELET
Absolute Monocytes: 484 cells/uL (ref 200–950)
Basophils Absolute: 71 cells/uL (ref 0–200)
Basophils Relative: 1.5 %
Eosinophils Absolute: 150 cells/uL (ref 15–500)
Eosinophils Relative: 3.2 %
HCT: 37.7 % (ref 35.0–45.0)
Hemoglobin: 12.8 g/dL (ref 11.7–15.5)
Lymphs Abs: 1227 cells/uL (ref 850–3900)
MCH: 31.5 pg (ref 27.0–33.0)
MCHC: 34 g/dL (ref 32.0–36.0)
MCV: 92.9 fL (ref 80.0–100.0)
MPV: 10.7 fL (ref 7.5–12.5)
Monocytes Relative: 10.3 %
Neutro Abs: 2768 cells/uL (ref 1500–7800)
Neutrophils Relative %: 58.9 %
Platelets: 204 10*3/uL (ref 140–400)
RBC: 4.06 10*6/uL (ref 3.80–5.10)
RDW: 12.3 % (ref 11.0–15.0)
Total Lymphocyte: 26.1 %
WBC: 4.7 10*3/uL (ref 3.8–10.8)

## 2018-09-18 LAB — BASIC METABOLIC PANEL WITH GFR
BUN: 22 mg/dL (ref 7–25)
CO2: 31 mmol/L (ref 20–32)
Calcium: 9.5 mg/dL (ref 8.6–10.4)
Chloride: 98 mmol/L (ref 98–110)
Creat: 0.86 mg/dL (ref 0.60–0.93)
GFR, Est African American: 78 mL/min/{1.73_m2} (ref >=60)
GFR, Est Non African American: 67 mL/min/{1.73_m2} (ref >=60)
Glucose, Bld: 83 mg/dL (ref 65–99)
Potassium: 3.4 mmol/L — ABNORMAL LOW (ref 3.5–5.3)
Sodium: 137 mmol/L (ref 135–146)

## 2018-09-18 NOTE — Telephone Encounter (Signed)
Pt came in for labs and bp check  BP check  BP:110/62

## 2018-09-18 NOTE — Telephone Encounter (Signed)
Documentation reviewed 

## 2018-09-20 ENCOUNTER — Encounter: Payer: Self-pay | Admitting: Infectious Diseases

## 2018-09-20 ENCOUNTER — Ambulatory Visit: Payer: PPO | Attending: Infectious Diseases | Admitting: Infectious Diseases

## 2018-09-20 ENCOUNTER — Other Ambulatory Visit: Payer: Self-pay

## 2018-09-20 VITALS — BP 138/85 | HR 84 | Temp 97.8°F | Ht 64.0 in | Wt 133.1 lb

## 2018-09-20 DIAGNOSIS — B9620 Unspecified Escherichia coli [E. coli] as the cause of diseases classified elsewhere: Secondary | ICD-10-CM | POA: Diagnosis not present

## 2018-09-20 DIAGNOSIS — N393 Stress incontinence (female) (male): Secondary | ICD-10-CM

## 2018-09-20 DIAGNOSIS — Z8742 Personal history of other diseases of the female genital tract: Secondary | ICD-10-CM

## 2018-09-20 DIAGNOSIS — Z9889 Other specified postprocedural states: Secondary | ICD-10-CM

## 2018-09-20 DIAGNOSIS — Z79899 Other long term (current) drug therapy: Secondary | ICD-10-CM

## 2018-09-20 DIAGNOSIS — Z90710 Acquired absence of both cervix and uterus: Secondary | ICD-10-CM | POA: Diagnosis not present

## 2018-09-20 DIAGNOSIS — R7881 Bacteremia: Secondary | ICD-10-CM | POA: Diagnosis not present

## 2018-09-20 DIAGNOSIS — I10 Essential (primary) hypertension: Secondary | ICD-10-CM

## 2018-09-20 DIAGNOSIS — Z888 Allergy status to other drugs, medicaments and biological substances status: Secondary | ICD-10-CM | POA: Diagnosis not present

## 2018-09-20 DIAGNOSIS — E785 Hyperlipidemia, unspecified: Secondary | ICD-10-CM

## 2018-09-20 NOTE — Progress Notes (Signed)
NAME: [name]  DOB: 02-12-1946  MRN: [id number]  Date/Time: 09/20/2018 10:07 AM  REQUESTING PROVIDER  lada Subjective:  REASON FOR CONSULT: recent bacteremia ? [name] is a 73 y.o. female with a history of HTN, HLD, urinary incontinence , h/o previous bladder surgery and post/ant vag wall repair Was admitted to [facility] between 4/2-08/19/18 with fatigue and runny nose and cough, was initially suspected as COVID but tested negative and Blood culture came back positive for E.coli. She had denied any urinary symptoms then and UA had 10-20 WBC but neg urine culture. She initially was treated with IV ceftriaxone, then meropenem and was discharged on ceftin. I had briefly seen the patient during that visit . She is here to figure why she got the bacteremia? PT has urinary incontinence for many years now. If is usually stress incontinence , and she has to constantly wear a pad.  She has had vaginal hysterectomy ( unclear reason) In the 90s she had bladder sling and bladder tack surgery and also repair of anterior, posterior vaginal wall repair. She says sshe has been on oral HRT and that has helped. Last year she was started on detrol by her PCP. In sept 2019 when she was [place] in [place] she developed sudden fever and chills and went to [facility] and was hospitalized- She was diagnosed with E.coli in the urine and treated like urosepsis. Other than incontinence she did not have any symptoms- On repeated questioning patient did say that she has a feeling that she does not empty the bladder completely and she sits on the toilet and has to wait for it to empty. She has no dysuria or hematuria, no renal stone.  She is scheduled to get colonoscopy every 5 years as her mother died of colon cancer age 33. Her last colonoscopy was 11/24/13 and a polyp was removed- next one is to be scheduled soon. In Dec 2019 she had Upper GI work up and was diagnosed with Helicobacter pylori  She has  never been diagnosed with gall stones nor does she have any rt upper quadrant pain  She says since discharge he is feeling much better but has residual fatigue, continues with incontinence, appetitie good, no fever or chills or abdominal pain Past Medical History:  Diagnosis Date  . Arthritis    in back  . Cataract 2011   remove from right eye  . Dyslipidemia, goal LDL below 130 10/28/2015  . Hormone replacement therapy (postmenopausal) 10/28/2015  . Hyperlipidemia   . Hypertension   . Osteopenia    Bladder prolapse, incontinence 12/05/2016  . TMJ (dislocation of temporomandibular joint)     Past Surgical History:  Procedure Laterality Date  .  MESH SLING X 15 YRS    . ANTERIOR AND POSTERIOR REPAIR    . AUGMENTATION MAMMAPLASTY Bilateral    20 yrs ago , then replaced 5 yrs ago  . BLADDER SUSPENSION    . BREAST ENHANCEMENT SURGERY    . BREAST SURGERY  2012   BILATERAL BREAST IMPLANT REMOVAL  . ESOPHAGOGASTRODUODENOSCOPY (EGD) WITH PROPOFOL N/A 05/04/2018   Procedure: ESOPHAGOGASTRODUODENOSCOPY (EGD) WITH PROPOFOL;  Surgeon: Virgel Manifold, MD;  Location: [facility] ENDOSCOPY;  Service: Endoscopy;  Laterality: N/A;  . EYE SURGERY  20114   right eye  . INCONTINENCE SURGERY    . NASAL SINUS SURGERY    . NOSE SURGERY    . VAGINAL HYSTERECTOMY     still has ovaries    Social History  Socioeconomic History  . Marital status: Married    Spouse name: Legrand Como  . Number of children: 1  . Years of education: Not on file  . Highest education level: Bachelor's degree (e.g., BA, AB, BS)  Occupational History  . Occupation: Retired  Scientific laboratory technician  . Financial resource strain: Not hard at all  . Food insecurity:    Worry: Never true    Inability: Never true  . Transportation needs:    Medical: No    Non-medical: No  Tobacco Use  . Smoking status: Never Smoker  . Smokeless tobacco: Never Used  . Tobacco comment: smoking cessation materials not required  Substance and Sexual  Activity  . Alcohol use: No  . Drug use: No  . Sexual activity: Not Currently    Partners: Male    Birth control/protection: Surgical  Lifestyle  . Physical activity:    Days per week: 3 days    Minutes per session: 60 min  . Stress: Not at all  Relationships  . Social connections:    Talks on phone: More than three times a week    Gets together: Three times a week    Attends religious service: More than 4 times per year    Active member of club or organization: Yes    Attends meetings of clubs or organizations: 1 to 4 times per year    Relationship status: Married  . Intimate partner violence:    Fear of current or ex partner: No    Emotionally abused: No    Physically abused: No    Forced sexual activity: No  Other Topics Concern  . Not on file  Social History Narrative  . Not on file    Family History  Problem Relation Age of Onset  . Cancer Mother        COLON AND UTERINE  . Cancer Father        PROSTATE  . Diabetes Father   . Hypertension Father   . Stroke Father   . Arthritis Sister   . Diabetes Son   . Breast cancer Neg Hx    Allergies  Allergen Reactions  . Tizanidine Other (See Comments)    Unable to sleep.   Current Outpatient Medications  Medication Sig Dispense Refill  . calcium carbonate (OS-CAL) 600 MG TABS Take 600 mg by mouth as needed.     . celecoxib (CELEBREX) 200 MG capsule Take 200 mg by mouth daily.     . chlorthalidone (HYGROTON) 25 MG tablet Take 0.5-1 tablets (12.5-25 mg total) by mouth daily. 30 tablet 5  . cholecalciferol (VITAMIN D) 1000 UNITS tablet Take 1,000 Units by mouth daily.    Marland Kitchen EST ESTROGENS-METHYLTEST DS 1.25-2.5 MG TABS Take 1 tablet by mouth daily. 90 each 2  . famotidine (PEPCID) 20 MG tablet Take 1 tablet (20 mg total) by mouth daily. 90 tablet 0  . fish oil-omega-3 fatty acids 1000 MG capsule Take 2 g by mouth daily.    Marland Kitchen gabapentin (NEURONTIN) 300 MG capsule Take 1 capsule (300 mg total) by mouth at bedtime. 90  capsule 1  . Multiple Vitamin (MULTIVITAMIN) capsule Take 1 capsule by mouth daily.    Marland Kitchen OLIVE LEAF EXTRACT PO Take 1 tablet by mouth daily.    Manus Gunning BOWEL PREP KIT 17.5-3.13-1.6 GM/177ML SOLN     . tolterodine (DETROL LA) 4 MG 24 hr capsule Take 1 capsule (4 mg total) by mouth daily. 90 capsule 1  . triamcinolone cream (  KENALOG) 0.5 % Apply 1 application topically 3 (three) times daily. As needed 30 g 0  . valACYclovir (VALTREX) 500 MG tablet Take 1 tablet (500 mg total) by mouth 2 (two) times daily. For three days in a row during outbreaks. 20 tablet 0  . fluconazole (DIFLUCAN) 150 MG tablet Take 1 tablet (150 mg total) by mouth daily. Do not take cholesterol medicine (pravastatin) for 3 days (Patient not taking: Reported on 09/20/2018) 1 tablet 0   No current facility-administered medications for this visit.      Abtx:  Anti-infectives (From admission, onward)   None      REVIEW OF SYSTEMS:  Const: negative fever, negative chills, negative weight loss Eyes: negative diplopia or visual changes, negative eye pain ENT: negative coryza, negative sore throat Resp: negative cough, hemoptysis, dyspnea Cards: negative for chest pain, palpitations, lower extremity edema GU: as above GI: Negative for abdominal pain, diarrhea, bleeding, constipation Skin: negative for rash and pruritus Heme: negative for easy bruising and gum/nose bleeding MS: negative for myalgias, arthralgias, back pain and muscle weakness Neurolo:negative for headaches, dizziness, vertigo, memory problems  Psych: negative for feelings of anxiety, depression  Endocrine: negative for thyroid, diabetes Allergy/Immunology- negative for any medication or food allergies ? Objective:  VITALS:  BP 138/85 (BP Location: Right Arm, Patient Position: Sitting, Cuff Size: Normal)   Pulse 84   Temp 97.8 F (36.6 C) (Oral)   Ht _0  (1.626 m)   Wt 133 lb 2 oz (60.4 kg)   LMP  (LMP Unknown)   BMI 22.85 kg/m  PHYSICAL EXAM:   General: Alert, cooperative, no distress, appears stated age.  Head: Normocephalic, without obvious abnormality, atraumatic. Eyes: Conjunctivae clear, anicteric sclerae. Pupils are equal ENT Nares normal. No drainage or sinus tenderness. Lips, mucosa, and tongue normal. No Thrush Neck: Supple, symmetrical, no adenopathy, thyroid: non tender no carotid bruit and no JVD. Back: No CVA tenderness. Lungs: Clear to auscultation bilaterally. No Wheezing or Rhonchi. No rales. Heart: Regular rate and rhythm, no murmur, rub or gallop. Abdomen: Soft, non-tender,not distended. Bowel sounds normal. No masses Extremities: atraumatic, no cyanosis. No edema. No clubbing Skin: No rashes or lesions. Or bruising Lymph: Cervical, supraclavicular normal. Neurologic: Grossly non-focal Pertinent Labs Lab Results CBC    Component Value Date/Time   WBC 4.7 09/18/2018 0947   RBC 4.06 09/18/2018 0947   HGB 12.8 09/18/2018 0947   HGB 14.2 10/29/2015 0805   HCT 37.7 09/18/2018 0947   HCT 41.6 10/29/2015 0805   PLT 204 09/18/2018 0947   PLT 186 10/29/2015 0805   MCV 92.9 09/18/2018 0947   MCV 93 10/29/2015 0805   MCH 31.5 09/18/2018 0947   MCHC 34.0 09/18/2018 0947   RDW 12.3 09/18/2018 0947   RDW 13.0 10/29/2015 0805   LYMPHSABS 1,227 09/18/2018 0947   LYMPHSABS 1.3 10/29/2015 0805   MONOABS 0.9 08/16/2018 1439   EOSABS 150 09/18/2018 0947   EOSABS 0.2 10/29/2015 0805   BASOSABS 71 09/18/2018 0947   BASOSABS 0.0 10/29/2015 0805    CMP Latest Ref Rng & Units 09/18/2018 08/31/2018 08/18/2018  Glucose 65 - 99 mg/dL 83 78 107(H)  BUN 7 - 25 mg/dL 22 30(H) 25(H)  Creatinine 0.60 - 0.93 mg/dL 0.86 0.92 0.91  Sodium 135 - 146 mmol/L 137 138 136  Potassium 3.5 - 5.3 mmol/L 3.4(L) 4.0 3.2(L)  Chloride 98 - 110 mmol/L 98 98 104  CO2 20 - 32 mmol/L 31 34(H) 25  Calcium 8.6 - 10.4 mg/dL  9.5 9.5 7.0(L)  Total Protein 6.1 - 8.1 g/dL - 6.3 -  Total Bilirubin 0.2 - 1.2 mg/dL - 0.4 -  Alkaline Phos 38 - 126 U/L  - - -  AST 10 - 35 U/L - 17 -  ALT 6 - 29 U/L - 15 -     ? Impression/Recommendation  73 y.o. female with a history of HTN, HLD, urinary incontinence , h/o previous bladder surgery and post/ant vag wall repair Was admitted to [facility] between 4/2-08/19/18 with fatigue and runny nose and cough, was initially suspected as COVID but tested negative and Blood culture came back positive for E.coli. She had denied any urinary symptoms then and UA had 10-20 WBC but neg urine culture. She initially was treated with IV ceftriaxone, then meropenem and was discharged on ceftin. I had briefly seen the patient during that visit . She is here to figure why she got the bacteremia?? ? ?Recent E.coli bacteremia: treated-. E.coli is a GI bacteria and usually causes UTI. The source of the bacteremia could have been the Urinary tract. D.D colon source like diverticulitis VS gall bladder pathology   Pt during her last admission did not have any UTI  She has long standing incontinence and some difficulty in emptying her bladder. She has been on detrol for the incontinence since 1 year. In the past year she had an earlier episode in Sept 2019 when she had fever and chills during her vacation in [place] and was diagnosed with E.coli UTI. She has also had bladder prolapse and repair ( sling, tack and vaginal wall repair in the past)  She likely has incomplete bladder emptying and the Detrol ( which is anticholinergic) may aggravate urinary retention  Recommend UROgyn consult for incontinence, incomplete emptying  prolapse, r/o stones Recommend colonoscopy to r/o Colon lesions like diverticulitis or tumor( less likely) If both these are normal then may look at her gall bladder with ultrasound Hold detrol  ___________________________________________________ Discussed with patientin great detail

## 2018-09-20 NOTE — Patient Instructions (Addendum)
You are here to follow up after recent hospitalization on April 2nd when you were diagnosed with E.Coli bacteremia but your urine that time did not have the bacteria- In sept 2019 when you were in [place] you had fever and chills and went to [facility] and the urine then showed e.coli and you were treated as UTI. You have urinary incontinence and you take Detrol since the past 1 year. You have had 2  surgeries ( bladder tack and bladder sling and anterior /posterior vaginal wall repair)  for bladder prolapse, incontinence- ) You had a colonoscopy 5 yrs ago - your mom died of colon cancer when she was 72 yrs old   e.coli is in our colon and sometimes it can colonize the vagina/bladder  The possible reason for having this e.coli infection and the source could be 1) bladder infection causing kidney infection and then spilling to blood. 2) from a colon issue like diverticulitis or colon lesion 3) could be from gall bladder      Recommend 1) Urogyn consult to look for post void urine /incomplete emptying and to look for prolapse, also to look for kidney and bladder stone 2) Colonoscopy to look for any colon source 3) Ultrasound gall bladder can be done to make sure your gall bladder has no stones or inflammation- but this is  less likely in your case as usually it causes severe rt upper abdominal pain  4) Discuss with your PCP  to see whether Detrol need to be stopped as you have bene on it for the past year and has had 2 infections . It could be if you have incomplete bladder emptying to begin with it can aggravate that. Take a probiotic

## 2018-09-24 ENCOUNTER — Telehealth: Payer: Self-pay | Admitting: Urology

## 2018-09-24 NOTE — Telephone Encounter (Signed)
Pt called and states that she has an appt on 11/06/2018 with Dr Erlene Quan and wants to know if it can be moved up sooner. She states that she is recovering from Sepsis and her infectious disease Dr believes that the Sepsis is related to her bladder. Please advise.

## 2018-09-25 NOTE — Telephone Encounter (Signed)
This is a nonurgert visit to establish care.  Unless she is having active UTI symptoms now, no need to move up.    Hollice Espy, MD

## 2018-09-26 NOTE — Telephone Encounter (Signed)
Called pt no answer. Left detailed message informing pt of the information below per Dr.Brandon. Advised pt to call office back if she were having active symptoms.

## 2018-10-09 ENCOUNTER — Ambulatory Visit: Payer: PPO | Admitting: Gastroenterology

## 2018-10-15 ENCOUNTER — Other Ambulatory Visit: Payer: Self-pay

## 2018-10-15 ENCOUNTER — Telehealth: Payer: Self-pay

## 2018-10-15 ENCOUNTER — Encounter: Payer: Self-pay | Admitting: Gastroenterology

## 2018-10-15 ENCOUNTER — Ambulatory Visit (INDEPENDENT_AMBULATORY_CARE_PROVIDER_SITE_OTHER): Payer: PPO | Admitting: Gastroenterology

## 2018-10-15 DIAGNOSIS — Z8619 Personal history of other infectious and parasitic diseases: Secondary | ICD-10-CM

## 2018-10-15 DIAGNOSIS — K219 Gastro-esophageal reflux disease without esophagitis: Secondary | ICD-10-CM

## 2018-10-15 NOTE — Progress Notes (Signed)
Carolyn Antigua, MD [address]  [place]  [place], [place] [address]  Main: [phone]  Fax: [phone]   Primary Care Physician: Arnetha Courser, MD  Virtual Visit via Video Note  I connected with patient on 10/15/18 at 11:00 AM EDT by video (using [email or web address]) and verified that I am speaking with the correct person using two identifiers.   I discussed the limitations, risks, security and privacy concerns of performing an evaluation and management service by video and the availability of in person appointments. I also discussed with the patient that there may be a patient responsible charge related to this service. The patient expressed understanding and agreed to proceed.  Location of Patient: Home Location of Provider: Home Persons involved: Patient and provider only (Nursing staff checked in patient via phone but were not physically involved in the video interaction - see their notes)   History of Present Illness: Chief Complaint  Patient presents with  . Follow-up    GERD    HPI: [name] is a 73 y.o. female with history of H. pylori and heartburn here for follow-up.  Patient was treated for H. pylori x2, first with triple therapy, with persistent H. pylori on biopsies, and then with quadruple therapy.  Eradication testing with H. pylori breath test subsequently was negative.  Patient now is reporting heartburn and is taking Tums daily.  She was prescribed Pepcid daily for heartburn but was unable to fill this.  States her pharmacy did not have it.  No dysphagia.  No abdominal pain, weight loss, altered bowel habits.  Initially treated with triple therapy by PCP due to positive breath test.  Repeat breath test was positive but patient was completely asymptomatic and was sent to [place] for further evaluation.  EGD was done which showed H. pylori and quadruple therapy was given.  EGD in December 2019 showed salmon-colored mucosa suspicious for short segment  Barrett's esophagus which was biopsied.  Study was otherwise normal.  Biopsy showed squamocolumnar mucosa with mild chronic active inflammation and multilayered epithelium consistent with reflux esophagitis.  Negative for goblet cells.  Last colonoscopy was in July 2015 for high risk screening due to colon cancer in her mother at less than 69 years old. One 5 mm polyp removed, diverticulosis and hemorrhoids seen. Pathology report from the polyp removal is not available to me. Repeat is recommended in 5 years.  Current Outpatient Medications  Medication Sig Dispense Refill  . calcium carbonate (OS-CAL) 600 MG TABS Take 600 mg by mouth as needed.     . celecoxib (CELEBREX) 200 MG capsule Take 200 mg by mouth daily.     . chlorthalidone (HYGROTON) 25 MG tablet Take 0.5-1 tablets (12.5-25 mg total) by mouth daily. 30 tablet 5  . cholecalciferol (VITAMIN D) 1000 UNITS tablet Take 1,000 Units by mouth daily.    Marland Kitchen EST ESTROGENS-METHYLTEST DS 1.25-2.5 MG TABS Take 1 tablet by mouth daily. 90 each 2  . fish oil-omega-3 fatty acids 1000 MG capsule Take 2 g by mouth daily.    Marland Kitchen gabapentin (NEURONTIN) 300 MG capsule Take 1 capsule (300 mg total) by mouth at bedtime. 90 capsule 1  . Multiple Vitamin (MULTIVITAMIN) capsule Take 1 capsule by mouth daily.    Marland Kitchen OLIVE LEAF EXTRACT PO Take 1 tablet by mouth daily.    . Probiotic Product (FORTIFY PROBIOTIC WOMENS EX ST PO) Take by mouth.    Manus Gunning BOWEL PREP KIT 17.5-3.13-1.6 GM/177ML SOLN     .  tolterodine (DETROL LA) 4 MG 24 hr capsule Take 1 capsule (4 mg total) by mouth daily. 90 capsule 1  . triamcinolone cream (KENALOG) 0.5 % Apply 1 application topically 3 (three) times daily. As needed 30 g 0  . valACYclovir (VALTREX) 500 MG tablet Take 1 tablet (500 mg total) by mouth 2 (two) times daily. For three days in a row during outbreaks. 20 tablet 0  . famotidine (PEPCID) 20 MG tablet Take 1 tablet (20 mg total) by mouth daily. (Patient not taking: Reported  on 10/15/2018) 90 tablet 0  . fluconazole (DIFLUCAN) 150 MG tablet Take 1 tablet (150 mg total) by mouth daily. Do not take cholesterol medicine (pravastatin) for 3 days (Patient not taking: Reported on 09/20/2018) 1 tablet 0   No current facility-administered medications for this visit.     Allergies as of 10/15/2018 - Review Complete 10/15/2018  Allergen Reaction Noted  . Tizanidine Other (See Comments) 12/12/2017    Review of Systems:    All systems reviewed and negative except where noted in HPI.   Observations/Objective:  Labs: CMP     Component Value Date/Time   NA 137 09/18/2018 0947   NA 141 10/29/2015 0805   K 3.4 (L) 09/18/2018 0947   CL 98 09/18/2018 0947   CO2 31 09/18/2018 0947   GLUCOSE 83 09/18/2018 0947   BUN 22 09/18/2018 0947   BUN 19 10/29/2015 0805   CREATININE 0.86 09/18/2018 0947   CALCIUM 9.5 09/18/2018 0947   PROT 6.3 08/31/2018 0915   PROT 6.2 10/27/2014 0940   ALBUMIN 3.7 08/16/2018 1439   ALBUMIN 4.1 10/27/2014 0940   AST 17 08/31/2018 0915   ALT 15 08/31/2018 0915   ALKPHOS 42 08/16/2018 1439   BILITOT 0.4 08/31/2018 0915   BILITOT 0.3 10/27/2014 0940   GFRNONAA 67 09/18/2018 0947   GFRAA 78 09/18/2018 0947   Lab Results  Component Value Date   WBC 4.7 09/18/2018   HGB 12.8 09/18/2018   HCT 37.7 09/18/2018   MCV 92.9 09/18/2018   PLT 204 09/18/2018    Imaging Studies: No results found.  Assessment and Plan:   [name] is a 73 y.o. y/o female with history of H. pylori and heartburn  Assessment and Plan: H. pylori has now been eradicated as evidenced by breath test after quadruple therapy  Patient does not have any abdominal pain that she initially had prior to her first course of H. pylori treatment with triple therapy  Is reporting heartburn daily Patient educated extensively on acid reflux lifestyle modification, including buying a bed wedge, not eating 3 hrs before bedtime, diet modifications, and handout given for the  same.   We will contact the pharmacy to see if Pepcid is available or if it is out of stock.  We will try to contact her pharmacy and [place].  If the medication is not available patient can continue omeprazole 20 mg once daily for 30 days that she has at home and we can then reattempt trying to get H2 RA for her after that.  Due to recent Zantac recall, Pepcid has been low or out of stock and multiple pharmacies.  (Risks of PPI use were discussed with patient including bone loss, C. Diff diarrhea, pneumonia, infections, CKD, electrolyte abnormalities.  If clinically possible based on symptoms, goal would be to maintain patient on the lowest dose possible, or discontinue the medication with institution of acid reflux lifestyle modifications over time. Pt. Verbalizes understanding and chooses  to continue the medication.)  We also discussed the finding of salmon-colored mucosa that did not show Barrett's on biopsies.  We discussed that this could be due to the salmon colored islands being very small and sampling thus not picking up Barrett's on the biopsies.  We discussed the options of surveillance for this in 2 to 3 years versus no further endoscopic intervention.  Patient agreeable to EGD for further biopsies in 2 to 3 years  Given small islands of salmon-colored mucosa seen, controlling acid reflux is important.  Lifestyle modifications and above therapy discussed for the same.  Patient is scheduled for screening colonoscopy in July  Follow Up Instructions: Follow-up in 3 months   I discussed the assessment and treatment plan with the patient. The patient was provided an opportunity to ask questions and all were answered. The patient agreed with the plan and demonstrated an understanding of the instructions.   The patient was advised to call back or seek an in-person evaluation if the symptoms worsen or if the condition fails to improve as anticipated.  I provided 15 minutes of  face-to-face time via video software during this encounter.   Virgel Manifold, MD  Speech recognition software was used to dictate this note.

## 2018-10-15 NOTE — Telephone Encounter (Signed)
Pt notified that Pepcid AC is available at [facility] on [address]. The pharmacy does not have any in stock. I could not reach her pharmacy, [facility] in [place], got answering machine.

## 2018-10-17 ENCOUNTER — Ambulatory Visit: Payer: Self-pay | Admitting: Family Medicine

## 2018-10-17 ENCOUNTER — Telehealth: Payer: Self-pay | Admitting: Family Medicine

## 2018-10-17 NOTE — Chronic Care Management (AMB) (Signed)
Chronic Care Management   Note  10/17/2018 Name: [name] MRN: [id number] DOB: 1945/11/08  [name] is a 73 y.o. year old female who is a primary care patient of Lada, Satira Anis, MD. I reached out to Olin Hauser by phone today in response to a referral sent by [name]'s health plan.    [name] was given information about Chronic Care Management services today including:  1. CCM service includes personalized support from designated clinical staff supervised by her physician, including individualized plan of care and coordination with other care providers 2. 24/7 contact phone numbers for assistance for urgent and routine care needs. 3. Service will only be billed when office clinical staff spend 20 minutes or more in a month to coordinate care. 4. Only one practitioner may furnish and bill the service in a calendar month. 5. The patient may stop CCM services at any time (effective at the end of the month) by phone call to the office staff. 6. The patient will be responsible for cost sharing (co-pay) of up to 20% of the service fee (after annual deductible is met).  Patient agreed to services and verbal consent obtained.   Follow up plan: Telephone appointment with CCM team member scheduled for: 10/19/2018  [place]  ??[email or web address]   ??[phone]

## 2018-10-17 NOTE — Telephone Encounter (Signed)
@  [facility]   Outreach Note  10/17/2018 Name: [name] MRN: [id number] DOB: 06-22-1945  Referred by: Arnetha Courser, MD Reason for referral : Chronic Care Management (Initial CCM outreach call was unsuccessful)   An unsuccessful telephone outreach was attempted today. The patient was referred to the case management team by for assistance with chronic care management and care coordination.   Follow Up Plan: A HIPPA compliant phone message was left for the patient providing contact information and requesting a return call.  The care management team will reach out to the patient again over the next 7 days.  If patient returns call to provider office, please advise to call [facility] at [place]  [email or web address]   ??[phone]

## 2018-10-19 ENCOUNTER — Telehealth: Payer: PPO

## 2018-10-19 ENCOUNTER — Ambulatory Visit: Payer: Self-pay

## 2018-10-19 NOTE — Chronic Care Management (AMB) (Signed)
   Chronic Care Management   Unsuccessful Call Note 10/19/2018 Name: [name] MRN: [id number] DOB: 09/11/1945  [name]. [name] is a 73 year old female who sees Dr. Enid Johnson for primary care. [name]'s health plan asked the CCM team to consult the patient for chronic case management and care coordination. Patient was consented to services and initial telephone assessment was scheduled for today at 11:00     Was unable to reach patient via telephone. I have left HIPAA compliant voicemail asking patient to return my call. Patient returned call and was appologetic that she had forgotten appointment. She wishes to reschedule   Plan: Telephone appointment with CCM RN CM rescheduled for 01/31/2019 at 10:00     Carolyn Art E. Rollene Rotunda, RN, BSN Nurse Care Coordinator [facility] Care Management  [phone]

## 2018-11-02 ENCOUNTER — Ambulatory Visit (INDEPENDENT_AMBULATORY_CARE_PROVIDER_SITE_OTHER): Payer: PPO

## 2018-11-02 ENCOUNTER — Other Ambulatory Visit: Payer: Self-pay

## 2018-11-02 DIAGNOSIS — E785 Hyperlipidemia, unspecified: Secondary | ICD-10-CM

## 2018-11-02 DIAGNOSIS — B9620 Unspecified Escherichia coli [E. coli] as the cause of diseases classified elsewhere: Secondary | ICD-10-CM

## 2018-11-02 DIAGNOSIS — I10 Essential (primary) hypertension: Secondary | ICD-10-CM

## 2018-11-02 DIAGNOSIS — R7881 Bacteremia: Secondary | ICD-10-CM

## 2018-11-02 NOTE — Chronic Care Management (AMB) (Signed)
Chronic Care Management   Initial Visit Note  11/03/2018 Name: [name] MRN: [id number] DOB: Apr 12, 1946  Subjective: "I know I can manage my health with exercise I just have not been doing anything since the gym closed"  Objective:  BP Readings from Last 3 Encounters:  09/20/18 138/85  08/19/18 125/73  08/01/18 111/77   Lab Results  Component Value Date   CHOL 177 08/31/2018   CHOL 182 04/05/2018   CHOL 183 02/08/2018   Lab Results  Component Value Date   HDL 39 (L) 08/31/2018   HDL 53 04/05/2018   HDL 27 (L) 02/08/2018   Lab Results  Component Value Date   LDLCALC 114 (H) 08/31/2018   LDLCALC 116 (H) 04/05/2018   LDLCALC 133 (H) 02/08/2018   Lab Results  Component Value Date   TRIG 126 08/31/2018   TRIG 43 04/05/2018   TRIG 115 02/08/2018   Lab Results  Component Value Date   CHOLHDL 4.5 08/31/2018   CHOLHDL 3.4 04/05/2018   CHOLHDL 6.8 (H) 02/08/2018   Assessment:  [name]. Dost is a 73 year old femalewho sees Dr. Enid Derry for primary care. [name]'s health planasked the CCM team to consult the patient for chronic case management and care coordination. Patient was consented to services and initial telephone assessment was scheduled for today.  Review of patient status, including review of consultants reports, relevant laboratory and other test results, and collaboration with appropriate care team members and the patient's provider was performed as part of comprehensive patient evaluation and provision of chronic care management services.     Advanced Directives 08/16/2018  Does Patient Have a Medical Advance Directive? Yes  Type of Paramedic of [place];Living will  Does patient want to make changes to medical advance directive? No - Patient declined  Copy of [place] in Chart? -     Goals Addressed            This Visit's Progress   . I really don't monitor my blood pressure. Should I?  (pt-stated)       Current Barriers:  Marland Kitchen Knowledge Deficits related to basic understanding of hypertension and dyslipidemia (high cholesterol levels) pathophysiology and self care management  Nurse Case Manager Clinical Goal(s):  Marland Kitchen Over the next 14 days, patient will verbalize basic understanding of hypertension disease process and self health management plan as evidenced by taking medications as prescribed, checking BP daily and recording, following a low sodium, heart healthy(low cholesterol) diet, and exercising daily.  Interventions:  . Evaluation of current treatment plan related to hypertension self management and patient's adherence to plan as established by provider. . Provided education to patient re: stroke prevention, s/s of heart attack and stroke, DASH diet, complications of uncontrolled blood pressure and elevated cholesterol . Reviewed recent lipid panel and discussed how to lower LDL and increase HDL . Reviewed medications with patient and discussed importance of compliance . Discussed plans with patient for ongoing care management follow up and provided patient with direct contact information for care management team . Advised patient, providing education and rationale, to monitor blood pressure daily and record  Patient Self Care Activities:  . Self administers medications as prescribed . Attends all scheduled provider appointments . Calls provider office for new concerns, questions, or BP outside discussed parameters . Checks BP and records as discussed . Follows a low sodium diet/DASH diet (mediterranean diet is also good)  Initial goal documentation      .  I want to make sure I manage my urinary track health (pt-stated)       Current Barriers:  Marland Kitchen Knowledge Deficits related to additional therapies for controlling recurrence of UTI . Chronic Disease Management support and education needs related to recent diagnosis of urosepsis secondary to urinary retention ("saggy  bladder")  Nurse Case Manager Clinical Goal(s):  Marland Kitchen Over the next 60 days, patient will not experience hospital admission. Hospital Admissions in last 6 months = 1 . Over the next 60 days, the patient will demonstrate ongoing self health care management ability as evidenced by attending all provider appointments including 11/06/2018 with urologist, take medications as prescribed, empty bladder often, drink enough water for urine to be pale yellow, consider adding cranberry juice, cranberry supplement to diet (discuss with urologist at upcoming appointment)  Interventions:  . Reviewed medications with patient and discussed importance of compliance . Discussed plans with patient for ongoing care management follow up and provided patient with direct contact information for care management team . Provided patient with written educational materials related to UTI and UTI prevention . Reviewed scheduled/upcoming provider appointments including: upcoming appointment with Dr. Erlene Quan  Patient Self Care Activities:  . Self administers medications as prescribed . Attends all scheduled provider appointments . Calls pharmacy for medication refills . Attends church or other social activities . Performs ADL's independently . Performs IADL's independently . Calls provider office for new concerns or questions  Initial goal documentation         Follow up plan:  Telephone follow up appointment with care management team member scheduled for: 11/21/2018 at 11:15    Logan Vegh E. Rollene Rotunda, RN, BSN Nurse Care Coordinator [facility] Care Management  [phone]

## 2018-11-03 NOTE — Patient Instructions (Addendum)
Thank you allowing the Chronic Care Management Team to be a part of your care! It was a pleasure speaking with you today!  1. Please continue to take all medications as prescribed. 2. You may want to consider adding a form of cranberry to your diet, cranberry juice, AZO cranberry, cranberry extract (discuss with your urologist) 3. Exercise is the best way to keep your cholesterol under control. Even if you can't get to the gym, try to walk daily. 4. Try to check you blood pressure at least weekly if not more often just to make sure your pressure is controlled. 5. Monitor your sodium intake. A diet high in sodium will cause your BP to elevate. Someone with hypertension needs no more that 1500-2000 mg of sodium/day   CCM (Chronic Care Management) Team   Trish Fountain RN, BSN Nurse Care Coordinator  [phone]  Ruben Reason PharmD  Clinical Pharmacist  [phone]   [place], LCSW Clinical Social Worker [phone]  Goals Addressed            This Visit's Progress   . I really don't monitor my blood pressure. Should I? (pt-stated)       Current Barriers:  Marland Kitchen Knowledge Deficits related to basic understanding of hypertension and dyslipidemia (high cholesterol levels) pathophysiology and self care management  Nurse Case Manager Clinical Goal(s):  Marland Kitchen Over the next 14 days, patient will verbalize basic understanding of hypertension disease process and self health management plan as evidenced by taking medications as prescribed, checking BP daily and recording, following a low sodium, heart healthy(low cholesterol) diet, and exercising daily.  Interventions:  . Evaluation of current treatment plan related to hypertension self management and patient's adherence to plan as established by provider. . Provided education to patient re: stroke prevention, s/s of heart attack and stroke, DASH diet, complications of uncontrolled blood pressure and elevated cholesterol . Reviewed  recent lipid panel and discussed how to lower LDL and increase HDL . Reviewed medications with patient and discussed importance of compliance . Discussed plans with patient for ongoing care management follow up and provided patient with direct contact information for care management team . Advised patient, providing education and rationale, to monitor blood pressure daily and record  Patient Self Care Activities:  . Self administers medications as prescribed . Attends all scheduled provider appointments . Calls provider office for new concerns, questions, or BP outside discussed parameters . Checks BP and records as discussed . Follows a low sodium diet/DASH diet (mediterranean diet is also good)  Initial goal documentation      . I want to make sure I manage my urinary track health (pt-stated)       Current Barriers:  Marland Kitchen Knowledge Deficits related to additional therapies for controlling recurrence of UTI . Chronic Disease Management support and education needs related to recent diagnosis of urosepsis secondary to urinary retention ("saggy bladder")  Nurse Case Manager Clinical Goal(s):  Marland Kitchen Over the next 60 days, patient will not experience hospital admission. Hospital Admissions in last 6 months = 1 . Over the next 60 days, the patient will demonstrate ongoing self health care management ability as evidenced by attending all provider appointments including 11/06/2018 with urologist, take medications as prescribed, empty bladder often, drink enough water for urine to be pale yellow, consider adding cranberry juice, cranberry supplement to diet (discuss with urologist at upcoming appointment)  Interventions:  . Reviewed medications with patient and discussed importance of compliance . Discussed plans with patient  for ongoing care management follow up and provided patient with direct contact information for care management team . Provided patient with written educational materials related to  UTI and UTI prevention . Reviewed scheduled/upcoming provider appointments including: upcoming appointment with Dr. Erlene Quan  Patient Self Care Activities:  . Self administers medications as prescribed . Attends all scheduled provider appointments . Calls pharmacy for medication refills . Attends church or other social activities . Performs ADL's independently . Performs IADL's independently . Calls provider office for new concerns or questions  Initial goal documentation        Print copy of patient instructions provided.   Telephone follow up appointment with care management team member scheduled for: 11/21/2018 at 11:15  SYMPTOMS OF A STROKE   You have any symptoms of stroke. "BE FAST" is an easy way to remember the main warning signs: ? B - Balance. Signs are dizziness, sudden trouble walking, or loss of balance. ? E - Eyes. Signs are trouble seeing or a sudden change in how you see. ? F - Face. Signs are sudden weakness or loss of feeling of the face, or the face or eyelid drooping on one side. ? A - Arms. Signs are weakness or loss of feeling in an arm. This happens suddenly and usually on one side of the body. ? S - Speech. Signs are sudden trouble speaking, slurred speech, or trouble understanding what people say. ? T - Time. Time to call emergency services. Write down what time symptoms started.  You have other signs of stroke, such as: ? A sudden, very bad headache with no known cause. ? Feeling sick to your stomach (nausea). ? Throwing up (vomiting). ? Jerky movements you cannot control (seizure).  SYMPTOMS OF A HEART ATTACK  What are the signs or symptoms? Symptoms of this condition include:  Chest pain. It may feel like: ? Crushing or squeezing. ? Tightness, pressure, fullness, or heaviness.  Pain in the arm, neck, jaw, back, or upper body.  Shortness of breath.  Heartburn.  Indigestion.  Nausea.  Cold sweats.  Feeling tired.      Sudden  lightheadedness.   Urosepsis, Adult  Urosepsis is an infection that has spread to the blood (sepsis) from the kidneys, ureters, bladder, and urethra (urinary tract). These organs make, store, and pass urine from the body. Urosepsis is a severe illness that can be life-threatening if it is not treated immediately. What are the causes? Causes of this condition include:  A urinary tract infection (UTI) that spreads to your blood.  A urinary tract blockage (obstruction) due to kidney stones.  Having a small, thin tube in your urethra that drains urine from your bladder for a period of time (indwelling urinary catheter).  Swelling of the prostate (prostatitis) or prostate infection (abscess), in men. What increases the risk? This condition is more likely to develop in people who:  Are female.  Are 65 or older.  Have a long-term (chronic) disease, such as diabetes.  Have a weak disease-fighting system (immune system).  Have a condition that lessens or changes urine flow, such as a kidney or bladder stone, prostate disease, or a tumor of the urinary tract.  Have had surgery of the urinary tract.  Use a urinary catheter.  Have lost feeling below the waist or are in a wheelchair. What are the signs or symptoms? Early symptoms of this condition are similar to symptoms of a severe UTI. These may include:  Pain in your side, back, or  lower abdomen.  Fever and chills.  Nausea and vomiting.  Frequent need to pass urine.  Burning pain when passing urine.  Bloody or cloudy urine.  Bad-smelling urine.  Fatigue. Once the infection has spread to the blood and a sepsis reaction starts, other symptoms may include:  High fever.  Chills with shaking.  Fast breathing.  Fast heartbeat.  Cold and clammy skin.  Anxiety.  Confusion.  Severe pain in the abdomen.  Trouble breathing.  Trouble passing urine or not being able to pass urine.  Fainting. How is this  diagnosed? This condition is diagnosed based on your symptoms, your medical history, and a physical exam. You may have urine tests or blood tests to check kidney function and look for infection. You may also have imaging tests to check for blockages in the urinary tract. How is this treated? This condition is a medical emergency that needs to be treated in the hospital. Treatment may include:  Receiving one or more of the following through an IV: ? Antibiotic medicines. ? Fluids. ? Medicines to support blood pressure.  Oxygen and breathing support.  Removing a urinary catheter that may be a source of infection, if you have one.  Filtering your blood with a machine (dialysis).  Surgery to drain infected areas or restore urine flow. This is rare. Follow these instructions at home:  Take over-the-counter and prescription medicines only as told by your health care provider.  If you were prescribed an antibiotic medicine to take at home, take it as told by your health care provider. Do not stop taking the antibiotic even if you start to feel better.  Drink enough fluid to keep your urine pale yellow.  Return to your normal activities as told by your health care provider. Ask your health care provider what activities are safe for you.  Keep all follow-up visits as told by your health care provider. This is important. Contact a health care provider if you have:  Symptoms that get worse or do not get better with treatment.  New UTI symptoms. Get help right away if:  You have new or continued symptoms of sepsis after hospitalization, such as: ? High fever. ? Chills. ? Difficulty breathing. ? Confusion. ? Nausea and vomiting. These symptoms may represent a serious problem that is an emergency. Do not wait to see if the symptoms will go away. Get medical help right away. Call your local emergency services (911 in the [place] Do not drive yourself to the hospital. Summary  Urosepsis is  an infection that has spread to the blood (sepsis) from the urinary tract. It is a severe illness that can be life-threatening if it is not treated immediately.  Possible causes of urosepsis include a UTI that spreads to your blood, blockage from kidney stones, having a urinary catheter, or prostate swelling or infection.  This condition is a medical emergency that is treated in the hospital with antibiotics. This information is not intended to replace advice given to you by your health care provider. Make sure you discuss any questions you have with your health care provider. Document Released: 05/02/2005 Document Revised: 03/23/2017 Document Reviewed: 03/23/2017 Elsevier Interactive Patient Education  [address] instructions  Make sure you: ? Pee until your bladder is empty.  ? Do not hold pee for a long time. ? Empty your bladder after sex. ? Wipe from front to back after pooping if you are a female. Use each tissue one time when you wipe.  Drink enough fluid to keep your pee pale yellow.  Keep all follow-up visits as told by your doctor. This is important.   Dyslipidemia Dyslipidemia is an imbalance of waxy, fat-like substances (lipids) in the blood. The body needs lipids in small amounts. Dyslipidemia often involves a high level of cholesterol or triglycerides, which are types of lipids. Common forms of dyslipidemia include:  High levels of LDL cholesterol. LDL is the type of cholesterol that causes fatty deposits (plaques) to build up in the blood vessels that carry blood away from your heart (arteries).  Low levels of HDL cholesterol. HDL cholesterol is the type of cholesterol that protects against heart disease. High levels of HDL remove the LDL buildup from arteries.  High levels of triglycerides. Triglycerides are a fatty substance in the blood that is linked to a buildup of plaques in the arteries. What are the causes? Primary dyslipidemia is caused by  changes (mutations) in genes that are passed down through families (inherited). These mutations cause several types of dyslipidemia. Secondary dyslipidemia is caused by lifestyle choices and diseases that lead to dyslipidemia, such as:  Eating a diet that is high in animal fat.  Not getting enough exercise.  Having diabetes, kidney disease, liver disease, or thyroid disease.  Drinking large amounts of alcohol.  Using certain medicines. What increases the risk? You are more likely to develop this condition if you are an older man or if you are a woman who has gone through menopause. Other risk factors include:  Having a family history of dyslipidemia.  Taking certain medicines, including birth control pills, steroids, some diuretics, and beta-blockers.  Smoking cigarettes.  Eating a high-fat diet.  Having certain medical conditions such as diabetes, polycystic ovary syndrome (PCOS), kidney disease, liver disease, or hypothyroidism.  Not exercising regularly.  Being overweight or obese with too much belly fat. What are the signs or symptoms? In most cases, dyslipidemia does not usually cause any symptoms. In severe cases, very high lipid levels can cause:  Fatty bumps under the skin (xanthomas).  White or gray ring around the black center (pupil) of the eye. Very high triglyceride levels can cause inflammation of the pancreas (pancreatitis). How is this diagnosed? Your health care provider may diagnose dyslipidemia based on a routine blood test (fasting blood test). Because most people do not have symptoms of the condition, this blood testing (lipid profile) is done on adults age 62 and older and is repeated every 5 years. This test checks:  Total cholesterol. This measures the total amount of cholesterol in your blood, including LDL cholesterol, HDL cholesterol, and triglycerides. A healthy number is below 200. Yours 177  LDL cholesterol. The target number for LDL cholesterol  is different for each person, depending on individual risk factors. Ask your health care provider what your LDL cholesterol should be. Your LDL is 114. This number needs to be at least less than 100. The lower the better  HDL cholesterol. An HDL level of 60 or higher is best because it helps to protect against heart disease. A number below 62 for men or below 45 for women increases the risk for heart disease. Your HDL is 39. It has been 64 when you were exercising  Triglycerides. A healthy triglyceride number is below 150. Yours 126 but they have been as low as 46 when you were exercising If your lipid profile is abnormal, your health care provider may do other blood tests. How is this treated? Treatment depends on the type  of dyslipidemia that you have and your other risk factors for heart disease and stroke. Your health care provider will have a target range for your lipid levels based on this information. For many people, this condition may be treated by lifestyle changes, such as diet and exercise. Your health care provider may recommend that you:  Get regular exercise.  Make changes to your diet.  Quit smoking if you smoke. If diet changes and exercise do not help you reach your goals, your health care provider may also prescribe medicine to lower lipids. The most commonly prescribed type of medicine lowers your LDL cholesterol (statin drug). If you have a high triglyceride level, your provider may prescribe another type of drug (fibrate) or an omega-3 fish oil supplement, or both. Follow these instructions at home:  Eating and drinking  Follow instructions from your health care provider or dietitian about eating or drinking restrictions.  Eat a healthy diet as told by your health care provider. This can help you reach and maintain a healthy weight, lower your LDL cholesterol, and raise your HDL cholesterol. This may include: ? Limiting your calories, if you are overweight. ? Eating more  fruits, vegetables, whole grains, fish, and lean meats. ? Limiting saturated fat, trans fat, and cholesterol.  If you drink alcohol: ? Limit how much you use. ? Be aware of how much alcohol is in your drink. In the [place] one drink equals one 12 oz bottle of beer (355 mL), one 5 oz glass of wine (148 mL), or one 1 oz glass of hard liquor (44 mL).  Do not drink alcohol if: ? Your health care provider tells you not to drink. ? You are pregnant, may be pregnant, or are planning to become pregnant. Activity  Get regular exercise. Start an exercise and strength training program as told by your health care provider. Ask your health care provider what activities are safe for you. Your health care provider may recommend: ? 30 minutes of aerobic activity 4-6 days a week. Brisk walking is an example of aerobic activity. ? Strength training 2 days a week. General instructions  Do not use any products that contain nicotine or tobacco, such as cigarettes, e-cigarettes, and chewing tobacco. If you need help quitting, ask your health care provider.  Take over-the-counter and prescription medicines only as told by your health care provider. This includes supplements.  Keep all follow-up visits as told by your health care provider. Contact a health care provider if:  You are: ? Having trouble sticking to your exercise or diet plan. ? Struggling to quit smoking or control your use of alcohol. Summary  Dyslipidemia often involves a high level of cholesterol or triglycerides, which are types of lipids.  Treatment depends on the type of dyslipidemia that you have and your other risk factors for heart disease and stroke.  For many people, treatment starts with lifestyle changes, such as diet and exercise.  Your health care provider may prescribe medicine to lower lipids. This information is not intended to replace advice given to you by your health care provider. Make sure you discuss any questions you  have with your health care provider. Document Released: 05/07/2013 Document Revised: 12/25/2017 Document Reviewed: 12/01/2017 Elsevier Interactive Patient Education  [employer].

## 2018-11-05 ENCOUNTER — Other Ambulatory Visit: Payer: Self-pay | Admitting: Family Medicine

## 2018-11-05 DIAGNOSIS — I10 Essential (primary) hypertension: Secondary | ICD-10-CM

## 2018-11-06 ENCOUNTER — Ambulatory Visit: Payer: PPO | Admitting: Urology

## 2018-11-06 ENCOUNTER — Other Ambulatory Visit: Payer: Self-pay

## 2018-11-06 ENCOUNTER — Encounter: Payer: Self-pay | Admitting: Urology

## 2018-11-06 VITALS — BP 124/80 | HR 85 | Ht 64.0 in | Wt 132.0 lb

## 2018-11-06 DIAGNOSIS — N390 Urinary tract infection, site not specified: Secondary | ICD-10-CM

## 2018-11-06 DIAGNOSIS — N393 Stress incontinence (female) (male): Secondary | ICD-10-CM | POA: Diagnosis not present

## 2018-11-06 DIAGNOSIS — R300 Dysuria: Secondary | ICD-10-CM | POA: Diagnosis not present

## 2018-11-06 LAB — URINALYSIS, COMPLETE
Bilirubin, UA: NEGATIVE
Glucose, UA: NEGATIVE
Ketones, UA: NEGATIVE
Leukocytes,UA: NEGATIVE
Nitrite, UA: NEGATIVE
Protein,UA: NEGATIVE
RBC, UA: NEGATIVE
Specific Gravity, UA: 1.01 (ref 1.005–1.030)
Urobilinogen, Ur: 0.2 mg/dL (ref 0.2–1.0)
pH, UA: 7 (ref 5.0–7.5)

## 2018-11-06 LAB — MICROSCOPIC EXAMINATION
Bacteria, UA: NONE SEEN
RBC, Urine: NONE SEEN /hpf (ref 0–2)
WBC, UA: NONE SEEN /hpf (ref 0–5)

## 2018-11-06 LAB — BLADDER SCAN AMB NON-IMAGING

## 2018-11-06 NOTE — Progress Notes (Addendum)
11/06/2018 11:37 AM   [name] [birth year] [id number]  Referring provider: Arnetha Courser, MD [address] [place],  [place] [address]  Chief Complaint  Patient presents with   Recurrent UTI    HPI: 73 year old female who presents today for further evaluation of recent UTI/sepsis of urinary source.  She was recently hospitalized on 08/19/2018 with E. coli bacteremia/sepsis A. fib with RVR and acute respiratory failure.  She grew pansensitive E. coli in her blood.  Her urine culture was negative.  Urinalysis at the time showed a highly contaminated urine with multiple epithelial cells along with white blood cells or red blood cells but minimal bacteria.  She reports that she had an episode of bacteremia last year, also E. coli presumably related to urinary tract source.  This admission was on 01/2018.  UA at the time was concerning for possible infection with multiple white blood cells and many bacteria on urinalysis.  Urine culture did in fact grow E. coli resistant to Cipro, gentamicin, ampicillin and Levaquin otherwise sensitive.  Education, she had no urinary tract symptoms.  She was just having fatigue malaise and fevers.   She had no renal bladder imaging during either admission.  She denies a history of flank pain or kidney stones.  She does have a complex GU history.  She is status post hysterectomy as well as bladder suspension surgery x2 by her gynecologist in [place], Dr. Hulan Fray.  She is unsure whether or not mesh was used.  She does have a personal history of incontinence.  She primarily describes stress incontinence with leaking with activity.  She wears 1 pad per day which is generally not saturated and less its highly active day.  She was also taking Detrol which she stopped after seeing infectious disease doctor for concern for incomplete bladder emptying.  She notes no difference in her urination after stopping this medication.  She does note that she  was having dry eyes.  She is currently on hormone replacement therapy which she plans to be on indefinitely.  Is currently managed by her primary care physician.  She takes estrogen/methyltestosterone DS.  She indicates that this is for strengthening of her bladder wall.  She is aware of possible increased risk of malignancy as well as cardiac disease.  Prior to this, she denies any history of urinary tract infections.  PMH: Past Medical History:  Diagnosis Date   Arthritis    in back   Cataract 2011   remove from right eye   Dyslipidemia, goal LDL below 130 10/28/2015   Hormone replacement therapy (postmenopausal) 10/28/2015   Hyperlipidemia    Hypertension    Osteopenia    Patient is full code 12/05/2016   TMJ (dislocation of temporomandibular joint)     Surgical History: Past Surgical History:  Procedure Laterality Date    MESH SLING X 15 YRS     ANTERIOR AND POSTERIOR REPAIR     AUGMENTATION MAMMAPLASTY Bilateral    20 yrs ago , then replaced 5 yrs ago   [place]  2012   BILATERAL BREAST IMPLANT REMOVAL   ESOPHAGOGASTRODUODENOSCOPY (EGD) WITH PROPOFOL N/A 05/04/2018   Procedure: ESOPHAGOGASTRODUODENOSCOPY (EGD) WITH PROPOFOL;  Surgeon: Virgel Manifold, MD;  Location: [facility] ENDOSCOPY;  Service: Endoscopy;  Laterality: N/A;   EYE SURGERY  20114   right eye   INCONTINENCE SURGERY     NASAL SINUS SURGERY  NOSE SURGERY     VAGINAL HYSTERECTOMY     still has ovaries    Home Medications:  Allergies as of 11/06/2018      Reactions   Tizanidine Other (See Comments)   Unable to sleep.      Medication List       Accurate as of November 06, 2018 11:37 AM. If you have any questions, ask your nurse or doctor.        STOP taking these medications   Suprep Bowel Prep Kit 17.5-3.13-1.6 GM/177ML Soln Generic drug: Na Sulfate-K Sulfate-Mg Sulf Stopped by: Hollice Espy, MD   tolterodine 4  MG 24 hr capsule Commonly known as: DETROL LA Stopped by: Hollice Espy, MD     TAKE these medications   Calcium-Magnesium-Zinc 334-134-5 MG Tabs Take 1 tablet by mouth daily.   celecoxib 200 MG capsule Commonly known as: CELEBREX Take 200 mg by mouth daily.   chlorthalidone 25 MG tablet Commonly known as: HYGROTON Take 0.5-1 tablets (12.5-25 mg total) by mouth daily.   cholecalciferol 1000 units tablet Commonly known as: VITAMIN D Take 1,000 Units by mouth daily.   Est Estrogens-Methyltest DS 1.25-2.5 MG Tabs Take 1 tablet by mouth daily.   famotidine 20 MG tablet Commonly known as: Pepcid Take 1 tablet (20 mg total) by mouth daily.   FORTIFY PROBIOTIC WOMENS EX ST PO Take by mouth.   gabapentin 300 MG capsule Commonly known as: NEURONTIN Take 1 capsule (300 mg total) by mouth at bedtime.   multivitamin capsule Take 1 capsule by mouth daily.   OLIVE LEAF EXTRACT PO Take 1 tablet by mouth daily.   triamcinolone cream 0.5 % Commonly known as: KENALOG Apply 1 application topically 3 (three) times daily. As needed   Triple Omega-3-6-9 Caps Take 1 capsule by mouth daily. 1200 mg Omega daily   valACYclovir 500 MG tablet Commonly known as: VALTREX Take 1 tablet (500 mg total) by mouth 2 (two) times daily. For three days in a row during outbreaks.       Allergies:  Allergies  Allergen Reactions   Tizanidine Other (See Comments)    Unable to sleep.    Family History: Family History  Problem Relation Age of Onset   Cancer Mother        COLON AND UTERINE   Cancer Father        PROSTATE   Diabetes Father    Hypertension Father    Stroke Father    Arthritis Sister    Diabetes Son    Breast cancer Neg Hx     Social History:  reports that she has never smoked. She has never used smokeless tobacco. She reports that she does not drink alcohol or use drugs.  ROS: UROLOGY Frequent Urination?: No Hard to postpone urination?: No Burning/pain  with urination?: No Get up at night to urinate?: No Leakage of urine?: No Urine stream starts and stops?: No Trouble starting stream?: No Do you have to strain to urinate?: No Blood in urine?: No Urinary tract infection?: No Sexually transmitted disease?: No Injury to kidneys or bladder?: No Painful intercourse?: No Weak stream?: No Currently pregnant?: No Vaginal bleeding?: No Last menstrual period?: n  Gastrointestinal Nausea?: No Vomiting?: No Indigestion/heartburn?: No Diarrhea?: No Constipation?: No  Constitutional Fever: No Night sweats?: No Weight loss?: No Fatigue?: No  Skin Skin rash/lesions?: No Itching?: No  Eyes Blurred vision?: No Double vision?: No  Ears/Nose/Throat Sore throat?: No Sinus problems?: No  Hematologic/Lymphatic Swollen glands?: No  Easy bruising?: No  Cardiovascular Leg swelling?: No Chest pain?: No  Respiratory Cough?: No Shortness of breath?: No  Endocrine Excessive thirst?: No  Musculoskeletal Back pain?: No Joint pain?: No  Neurological Headaches?: No Dizziness?: No  Psychologic Depression?: No Anxiety?: No  Physical Exam: BP 124/80    Pulse 85    Ht _0  (1.626 m)    Wt 132 lb (59.9 kg)    LMP  (LMP Unknown)    BMI 22.66 kg/m   Constitutional:  Alert and oriented, No acute distress. HEENT: [place] AT, moist mucus membranes.  Trachea midline, no masses. Cardiovascular: No clubbing, cyanosis, or edema. Respiratory: Normal respiratory effort, no increased work of breathing. Skin: No rashes, bruises or suspicious lesions. Neurologic: Grossly intact, no focal deficits, moving all 4 extremities. Psychiatric: Normal mood and affect.  Laboratory Data: Lab Results  Component Value Date   WBC 4.7 09/18/2018   HGB 12.8 09/18/2018   HCT 37.7 09/18/2018   MCV 92.9 09/18/2018   PLT 204 09/18/2018    Lab Results  Component Value Date   CREATININE 0.86 09/18/2018   Urinalysis Urinalysis reviewed today,  negative  Pertinent Imaging: Results for orders placed or performed in visit on 11/06/18  Bladder Scan (Post Void Residual) in office  Result Value Ref Range   Scan Result 30m     Assessment & Plan:    1. Recurrent UTI 2 episodes of E. coli bacteremia, both presumed urinary source although most recent episode with a negative urine culture  Given that her presentation has been so profound, I recommended a urologic evaluation with upper tract imaging as well as cystoscopy to rule out any bladder anatomic pathology such as mesh erosion.  She is agreeable this plan.  We will also plan for pelvic exam at that time.  We discussed general guidelines for recurrent urinary tract infections including treatment with appropriate antibiotics, probiotics, d-mannose, cranberry tablets twice daily, hydration and hygiene amongst others.  Role for topical estrogen relatively limited given that she is on oral hormone replacement therapy.  - Urinalysis, Complete - Bladder Scan (Post Void Residual) in office - [place]; Future  2. Stress incontinence of urine Mild stress urinary continence status post surgery x2 This point time, she satisfied with her symptoms Agree with discontinuation of Detrol as this is for urge incontinence and she does not have significant urge symptoms Adequate bladder emptying today   Return in about 4 weeks (around 12/04/2018) for cysto/ pelvic exam/ f/u [facility].  AHollice Espy MD  [facility] [address] [place] [phone] I spent 45 min with this patient of which greater than 50% was spent in counseling and coordination of care with the patient.

## 2018-11-20 ENCOUNTER — Other Ambulatory Visit: Payer: Self-pay

## 2018-11-20 ENCOUNTER — Ambulatory Visit
Admission: RE | Admit: 2018-11-20 | Discharge: 2018-11-20 | Disposition: A | Discharge status: Home / Self Care | Payer: PPO | Source: Ambulatory Visit | Attending: Urology | Admitting: Urology

## 2018-11-20 DIAGNOSIS — N390 Urinary tract infection, site not specified: Secondary | ICD-10-CM | POA: Diagnosis not present

## 2018-11-21 ENCOUNTER — Ambulatory Visit: Payer: Self-pay

## 2018-11-21 ENCOUNTER — Ambulatory Visit (INDEPENDENT_AMBULATORY_CARE_PROVIDER_SITE_OTHER): Payer: PPO

## 2018-11-21 DIAGNOSIS — I10 Essential (primary) hypertension: Secondary | ICD-10-CM

## 2018-11-21 DIAGNOSIS — R197 Diarrhea, unspecified: Secondary | ICD-10-CM

## 2018-11-21 DIAGNOSIS — R799 Abnormal finding of blood chemistry, unspecified: Secondary | ICD-10-CM

## 2018-11-21 DIAGNOSIS — R7881 Bacteremia: Secondary | ICD-10-CM

## 2018-11-21 NOTE — Progress Notes (Signed)
This encounter was created in error - please disregard.

## 2018-11-21 NOTE — Chronic Care Management (AMB) (Signed)
  Chronic Care Management   Note  11/21/2018 Name: [name] MRN: [id number] DOB: February 22, 1946  Care Coordination: Incoming telephone call from [name] requesting assistance with understanding what labs were scheduled for her on Friday. Per [name], she received a MyChart notification of labs needing collecting on Friday. [name] was enquiring if RN CM placed order for labs after conversation this morning. After reviewing EMR, it is determined that [name] is scheduled for a drive up Covid test at [facility] secondary to pre-procedure screening (colonoscopy 11/28/2018)  [name] is grateful for clarification and assistance from RN CM.   Ramla Hase E. Rollene Rotunda, RN, BSN Nurse Care Coordinator [facility] Care Management  [phone]

## 2018-11-21 NOTE — Patient Instructions (Signed)
Thank you allowing the Chronic Care Management Team to be a part of your care! It was a pleasure speaking with you today!   Please take all medications as prescribed including your blood pressure medication  Continue to check your bp daily. It may be helpful for you to check a resting and an exertional pressure  Continue to follow your plan of care established by your urologist   CCM (Chronic Care Management) Team   Trish Fountain RN, BSN Nurse Care Coordinator  [phone]  Ruben Reason PharmD  Clinical Pharmacist  [phone]   [place], [place] Social Worker [phone]  Goals Addressed            This Visit's Progress   . I really don't monitor my blood pressure. Should I? (pt-stated)   On track    Current Barriers:  [place] Kitchen Knowledge Deficits related to basic understanding of hypertension and dyslipidemia (high cholesterol levels) pathophysiology and self care management  Nurse Case Manager Clinical Goal(s):  [place] Kitchen Over the next 14 days, patient will continue to show basic understanding of hypertension disease process and self health management plan as evidenced by taking medications as prescribed, checking BP daily and recording, following a low sodium, heart healthy(low cholesterol) diet, and exercising daily.  Interventions:  . Assessed for compliance with medications . Reviewed BP log . Assessed for compliance with hypertension self care management plan  Patient Self Care Activities:  . Self administers medications as prescribed . Attends all scheduled provider appointments . Calls provider office for new concerns, questions, or BP outside discussed parameters . Checks BP and records as discussed . Follows a low sodium diet/DASH diet (mediterranean diet is also good)  Please see past updates related to this goal by clicking on the "Past Updates" button in the selected goal       . I want to make sure I manage my urinary track health (pt-stated)    On track    Current Barriers:  [place] Kitchen Knowledge Deficits related to additional therapies for controlling recurrence of UTI . Chronic Disease Management support and education needs related to recent diagnosis of urosepsis secondary to urinary retention ("saggy bladder")  Nurse Case Manager Clinical Goal(s):  [place] Kitchen Over the next 60 days, patient will not experience hospital admission. Hospital Admissions in last 6 months = 1 . Over the next 60 days, the patient will demonstrate ongoing self health care management ability as evidenced by attending all provider appointments including 11/06/2018 with urologist, take medications as prescribed, empty bladder often, drink enough water for urine to be pale yellow, consider adding cranberry juice, cranberry supplement to diet (discuss with urologist at upcoming appointment)  Interventions:  . Reviewed recent appointment notes from urology consult . Assessed for patient compliance with suggested probiotics, cranberry supplements . Assessed for UTI symptoms  . Encouraged hydration with H2O   Patient Self Care Activities:  . Self administers medications as prescribed . Attends all scheduled provider appointments . Calls pharmacy for medication refills . Attends church or other social activities . Performs ADL's independently . Performs IADL's independently . Calls provider office for new concerns or questions  Initial goal documentation        The patient verbalized understanding of instructions provided today and declined a print copy of patient instruction materials.   Telephone follow up appointment with care management team member scheduled for: two weeks  SYMPTOMS OF A STROKE   You have any symptoms of stroke. "BE FAST" is an easy  way to remember the main warning signs: ? B - Balance. Signs are dizziness, sudden trouble walking, or loss of balance. ? E - Eyes. Signs are trouble seeing or a sudden change in how you see. ? F - Face. Signs are  sudden weakness or loss of feeling of the face, or the face or eyelid drooping on one side. ? A - Arms. Signs are weakness or loss of feeling in an arm. This happens suddenly and usually on one side of the body. ? S - Speech. Signs are sudden trouble speaking, slurred speech, or trouble understanding what people say. ? T - Time. Time to call emergency services. Write down what time symptoms started.  You have other signs of stroke, such as: ? A sudden, very bad headache with no known cause. ? Feeling sick to your stomach (nausea). ? Throwing up (vomiting). ? Jerky movements you cannot control (seizure).  SYMPTOMS OF A HEART ATTACK  What are the signs or symptoms? Symptoms of this condition include:  Chest pain. It may feel like: ? Crushing or squeezing. ? Tightness, pressure, fullness, or heaviness.  Pain in the arm, neck, jaw, back, or upper body.  Shortness of breath.  Heartburn.  Indigestion.  Nausea.  Cold sweats.  Feeling tired.  Sudden lightheadedness.

## 2018-11-21 NOTE — Chronic Care Management (AMB) (Signed)
Chronic Care Management   Follow Up Note   11/21/2018 Name: [name] MRN: [id number] DOB: 1945-08-25  Referred by: Patient, No Pcp Per Reason for referral : Chronic Care Management (follow up HTN/UTI)   Subjective: "I have learned so much and feel I am on a better path to caring for myself"   Objective:  BP Readings from Last 3 Encounters:  11/06/18 124/80  09/20/18 138/85  08/19/18 125/73    Assessment: [name] is a 73year old femalewho seesDr. Mikki Harbor primary care. [name]'s health planasked the CCM team to consult the patient forchronic case management and care coordination. Today RN CM followed up with [name] to assess for progression towards health goals.  Review of patient status, including review of consultants reports, relevant laboratory and other test results, and collaboration with appropriate care team members and the patient's provider was performed as part of comprehensive patient evaluation and provision of chronic care management services.    Goals Addressed            This Visit's Progress   . I really don't monitor my blood pressure. Should I? (pt-stated)   On track    [name] has purchased a new BP monitor and checks her BP daily. She makes sure she has rested for at least 5 min prior to checking. Reports readings in the high 90s and low 110s. She decided she would quit taking her BP medications because her readings were so low. Encouraged patient to check BP with standing/exertion. While on phone patient checked BP and reports 136/92. Patient able to see she needs to take her BP medications as she is very active and does not sit a lot during the day.    Current Barriers:  [place] Kitchen Knowledge Deficits related to basic understanding of hypertension and dyslipidemia (high cholesterol levels) pathophysiology and self care management  Nurse Case Manager Clinical Goal(s):  [place] Kitchen Over the next 14 days, patient will continue to show  basic understanding of hypertension disease process and self health management plan as evidenced by taking medications as prescribed, checking BP daily and recording, following a low sodium, heart healthy(low cholesterol) diet, and exercising daily.  Interventions:  . Assessed for compliance with medications . Reviewed BP log . Assessed for compliance with hypertension self care management plan  Patient Self Care Activities:  . Self administers medications as prescribed . Attends all scheduled provider appointments . Calls provider office for new concerns, questions, or BP outside discussed parameters . Checks BP and records as discussed . Follows a low sodium diet/DASH diet (mediterranean diet is also good)  Please see past updates related to this goal by clicking on the "Past Updates" button in the selected goal       . I want to make sure I manage my urinary track health (pt-stated)   On track    [name] denies onging UTI symptoms. She recently saw her urologist who has ordered additional testing to make sure there is no structural abnormalities that cause her urosepsis. She completed a renal and bladder ultrasound yesterday and has additional testing scheduled. She states she received all information needed from her urologist to understand her condition and how to manage. Current Barriers:  [place] Kitchen Knowledge Deficits related to additional therapies for controlling recurrence of UTI . Chronic Disease Management support and education needs related to recent diagnosis of urosepsis secondary to urinary retention ("saggy bladder")  Nurse Case Manager Clinical Goal(s):  [place] Kitchen Over the next 60  days, patient will not experience hospital admission. Hospital Admissions in last 6 months = 1 . Over the next 60 days, the patient will demonstrate ongoing self health care management ability as evidenced by attending all provider appointments including 11/06/2018 with urologist, take medications as prescribed,  empty bladder often, drink enough water for urine to be pale yellow, consider adding cranberry juice, cranberry supplement to diet (discuss with urologist at upcoming appointment)  Interventions:  . Reviewed recent appointment notes from urology consult . Assessed for patient compliance with suggested probiotics, cranberry supplements . Assessed for UTI symptoms  . Encouraged hydration with H2O   Patient Self Care Activities:  . Self administers medications as prescribed . Attends all scheduled provider appointments . Calls pharmacy for medication refills . Attends church or other social activities . Performs ADL's independently . Performs IADL's independently . Calls provider office for new concerns or questions  Initial goal documentation         Telephone follow up appointment with care management team member scheduled for:2 weeks to review BP    Milledge Gerding E. Rollene Rotunda, RN, BSN Nurse Care Coordinator [facility] Care Management  [phone]

## 2018-11-23 ENCOUNTER — Other Ambulatory Visit: Payer: Self-pay

## 2018-11-23 ENCOUNTER — Other Ambulatory Visit
Admission: RE | Admit: 2018-11-23 | Discharge: 2018-11-23 | Disposition: A | Discharge status: Home / Self Care | Payer: PPO | Source: Ambulatory Visit | Attending: Gastroenterology | Admitting: Gastroenterology

## 2018-11-23 DIAGNOSIS — Z1159 Encounter for screening for other viral diseases: Secondary | ICD-10-CM | POA: Diagnosis not present

## 2018-11-23 DIAGNOSIS — Z01812 Encounter for preprocedural laboratory examination: Secondary | ICD-10-CM | POA: Diagnosis not present

## 2018-11-24 LAB — SARS CORONAVIRUS 2 (TAT 6-24 HRS): SARS Coronavirus 2: NEGATIVE

## 2018-11-27 ENCOUNTER — Encounter: Payer: Self-pay | Admitting: *Deleted

## 2018-11-27 ENCOUNTER — Encounter: Payer: Self-pay | Admitting: Urology

## 2018-11-27 ENCOUNTER — Other Ambulatory Visit: Payer: Self-pay

## 2018-11-27 ENCOUNTER — Ambulatory Visit (INDEPENDENT_AMBULATORY_CARE_PROVIDER_SITE_OTHER): Payer: PPO | Admitting: Urology

## 2018-11-27 VITALS — BP 127/78 | HR 73 | Ht 64.0 in | Wt 131.0 lb

## 2018-11-27 DIAGNOSIS — N390 Urinary tract infection, site not specified: Secondary | ICD-10-CM

## 2018-11-27 MED ORDER — CIPROFLOXACIN HCL 500 MG PO TABS
500.0000 mg | ORAL_TABLET | Freq: Once | ORAL | Status: AC
  Administered 2018-11-27: 500 mg via ORAL

## 2018-11-28 ENCOUNTER — Ambulatory Visit: Payer: PPO | Admitting: Certified Registered Nurse Anesthetist

## 2018-11-28 ENCOUNTER — Ambulatory Visit
Admission: RE | Admit: 2018-11-28 | Discharge: 2018-11-28 | Disposition: A | Discharge status: Home / Self Care | Payer: PPO | Attending: Gastroenterology | Admitting: Gastroenterology

## 2018-11-28 ENCOUNTER — Encounter
Admission: RE | Disposition: A | Discharge status: Home / Self Care | Payer: Self-pay | Source: Home / Self Care | Attending: Gastroenterology

## 2018-11-28 ENCOUNTER — Encounter: Payer: Self-pay | Admitting: *Deleted

## 2018-11-28 DIAGNOSIS — K5730 Diverticulosis of large intestine without perforation or abscess without bleeding: Secondary | ICD-10-CM

## 2018-11-28 DIAGNOSIS — Z8249 Family history of ischemic heart disease and other diseases of the circulatory system: Secondary | ICD-10-CM | POA: Diagnosis not present

## 2018-11-28 DIAGNOSIS — I10 Essential (primary) hypertension: Secondary | ICD-10-CM | POA: Insufficient documentation

## 2018-11-28 DIAGNOSIS — Z833 Family history of diabetes mellitus: Secondary | ICD-10-CM | POA: Insufficient documentation

## 2018-11-28 DIAGNOSIS — Z79899 Other long term (current) drug therapy: Secondary | ICD-10-CM | POA: Insufficient documentation

## 2018-11-28 DIAGNOSIS — Z90710 Acquired absence of both cervix and uterus: Secondary | ICD-10-CM | POA: Diagnosis not present

## 2018-11-28 DIAGNOSIS — D120 Benign neoplasm of cecum: Secondary | ICD-10-CM | POA: Insufficient documentation

## 2018-11-28 DIAGNOSIS — Z791 Long term (current) use of non-steroidal anti-inflammatories (NSAID): Secondary | ICD-10-CM | POA: Insufficient documentation

## 2018-11-28 DIAGNOSIS — M4690 Unspecified inflammatory spondylopathy, site unspecified: Secondary | ICD-10-CM | POA: Insufficient documentation

## 2018-11-28 DIAGNOSIS — Z8261 Family history of arthritis: Secondary | ICD-10-CM | POA: Insufficient documentation

## 2018-11-28 DIAGNOSIS — Z888 Allergy status to other drugs, medicaments and biological substances status: Secondary | ICD-10-CM | POA: Insufficient documentation

## 2018-11-28 DIAGNOSIS — Z800 Family history of malignant neoplasm of digestive organs: Secondary | ICD-10-CM | POA: Insufficient documentation

## 2018-11-28 DIAGNOSIS — K635 Polyp of colon: Secondary | ICD-10-CM

## 2018-11-28 DIAGNOSIS — D122 Benign neoplasm of ascending colon: Secondary | ICD-10-CM | POA: Insufficient documentation

## 2018-11-28 DIAGNOSIS — Z1211 Encounter for screening for malignant neoplasm of colon: Secondary | ICD-10-CM | POA: Diagnosis not present

## 2018-11-28 DIAGNOSIS — E785 Hyperlipidemia, unspecified: Secondary | ICD-10-CM | POA: Insufficient documentation

## 2018-11-28 DIAGNOSIS — M8580 Other specified disorders of bone density and structure, unspecified site: Secondary | ICD-10-CM | POA: Diagnosis not present

## 2018-11-28 DIAGNOSIS — K5790 Diverticulosis of intestine, part unspecified, without perforation or abscess without bleeding: Secondary | ICD-10-CM | POA: Diagnosis not present

## 2018-11-28 DIAGNOSIS — K5520 Angiodysplasia of colon without hemorrhage: Secondary | ICD-10-CM | POA: Diagnosis not present

## 2018-11-28 DIAGNOSIS — K6289 Other specified diseases of anus and rectum: Secondary | ICD-10-CM

## 2018-11-28 HISTORY — PX: COLONOSCOPY WITH PROPOFOL: SHX5780

## 2018-11-28 LAB — MICROSCOPIC EXAMINATION: WBC, UA: NONE SEEN /hpf (ref 0–5)

## 2018-11-28 LAB — URINALYSIS, COMPLETE
Bilirubin, UA: NEGATIVE
Glucose, UA: NEGATIVE
Ketones, UA: NEGATIVE
Leukocytes,UA: NEGATIVE
Nitrite, UA: NEGATIVE
Protein,UA: NEGATIVE
Specific Gravity, UA: 1.01 (ref 1.005–1.030)
Urobilinogen, Ur: 0.2 mg/dL (ref 0.2–1.0)
pH, UA: 6.5 (ref 5.0–7.5)

## 2018-11-28 SURGERY — COLONOSCOPY WITH PROPOFOL
Anesthesia: General

## 2018-11-28 MED ORDER — LIDOCAINE HCL (CARDIAC) PF 100 MG/5ML IV SOSY
PREFILLED_SYRINGE | INTRAVENOUS | Status: DC | PRN
  Administered 2018-11-28: 50 mg via INTRAVENOUS

## 2018-11-28 MED ORDER — PROPOFOL 500 MG/50ML IV EMUL
INTRAVENOUS | Status: AC
  Filled 2018-11-28: qty 50

## 2018-11-28 MED ORDER — SODIUM CHLORIDE 0.9 % IV SOLN
INTRAVENOUS | Status: DC
  Administered 2018-11-28: 10:00:00 via INTRAVENOUS

## 2018-11-28 MED ORDER — LIDOCAINE HCL (PF) 2 % IJ SOLN
INTRAMUSCULAR | Status: AC
  Filled 2018-11-28: qty 10

## 2018-11-28 MED ORDER — PROPOFOL 500 MG/50ML IV EMUL
INTRAVENOUS | Status: DC | PRN
  Administered 2018-11-28: 175 ug/kg/min via INTRAVENOUS

## 2018-11-28 MED ORDER — PROPOFOL 10 MG/ML IV BOLUS
INTRAVENOUS | Status: DC | PRN
  Administered 2018-11-28 (×2): 50 mg via INTRAVENOUS

## 2018-11-28 NOTE — Anesthesia Post-op Follow-up Note (Signed)
Anesthesia QCDR form completed.        

## 2018-11-28 NOTE — H&P (Signed)
Carolyn Antigua, MD [address] [place], [place], [place], [address] [address], [place], [place], [place], [address] Phone: [phone]  Fax: [phone]  Primary Care Physician:  Arnetha Courser, MD   Pre-Procedure History & Physical: HPI:  [name] is a 73 y.o. female is here for a colonoscopy.   Past Medical History:  Diagnosis Date  . Arthritis    in back  . Cataract 2011   remove from right eye  . Dyslipidemia, goal LDL below 130 10/28/2015  . Hormone replacement therapy (postmenopausal) 10/28/2015  . Hyperlipidemia   . Hypertension   . Osteopenia   . Patient is full code 12/05/2016  . TMJ (dislocation of temporomandibular joint)     Past Surgical History:  Procedure Laterality Date  .  MESH SLING X 15 YRS    . ANTERIOR AND POSTERIOR REPAIR    . AUGMENTATION MAMMAPLASTY Bilateral    20 yrs ago , then replaced 5 yrs ago  . BLADDER SUSPENSION    . BREAST ENHANCEMENT SURGERY    . BREAST SURGERY  2012   BILATERAL BREAST IMPLANT REMOVAL  . ESOPHAGOGASTRODUODENOSCOPY (EGD) WITH PROPOFOL N/A 05/04/2018   Procedure: ESOPHAGOGASTRODUODENOSCOPY (EGD) WITH PROPOFOL;  Surgeon: Virgel Manifold, MD;  Location: [facility] ENDOSCOPY;  Service: Endoscopy;  Laterality: N/A;  . EYE SURGERY  20114   right eye  . INCONTINENCE SURGERY    . NASAL SINUS SURGERY    . NOSE SURGERY    . VAGINAL HYSTERECTOMY     still has ovaries    Prior to Admission medications   Medication Sig Start Date End Date Taking? Authorizing Provider  Calcium-Magnesium-Zinc 334-134-5 MG TABS Take 1 tablet by mouth daily.   Yes [provider]  celecoxib (CELEBREX) 200 MG capsule Take 200 mg by mouth daily.  08/12/13  Yes [provider]  chlorthalidone (HYGROTON) 25 MG tablet Take 0.5-1 tablets (12.5-25 mg total) by mouth daily. 11/05/18  Yes Hubbard Hartshorn, FNP  cholecalciferol (VITAMIN D) 1000 UNITS tablet Take 1,000 Units by mouth daily.   Yes [provider]   EST ESTROGENS-METHYLTEST DS 1.25-2.5 MG TABS Take 1 tablet by mouth daily. 05/02/18  Yes Lada, Satira Anis, MD  famotidine (PEPCID) 20 MG tablet Take 1 tablet (20 mg total) by mouth daily. 09/12/18  Yes Virgel Manifold, MD  gabapentin (NEURONTIN) 300 MG capsule Take 1 capsule (300 mg total) by mouth at bedtime. 08/02/18  Yes Lada, Satira Anis, MD  Multiple Vitamin (MULTIVITAMIN) capsule Take 1 capsule by mouth daily.   Yes [provider]  OLIVE LEAF EXTRACT PO Take 1 tablet by mouth daily.   Yes [provider]  Omega 3-6-9 Fatty Acids (TRIPLE OMEGA-3-6-9) CAPS Take 1 capsule by mouth daily. 1200 mg Omega daily   Yes [provider]  Probiotic Product (FORTIFY PROBIOTIC WOMENS EX ST PO) Take by mouth.   Yes [provider]  triamcinolone cream (KENALOG) 0.5 % Apply 1 application topically 3 (three) times daily. As needed 02/15/16  Yes Lada, Satira Anis, MD  valACYclovir (VALTREX) 500 MG tablet Take 1 tablet (500 mg total) by mouth 2 (two) times daily. For three days in a row during outbreaks. 02/06/18  Yes Arnetha Courser, MD    Allergies as of 09/13/2018 - Review Complete 08/30/2018  Allergen Reaction Noted  . Tizanidine Other (See Comments) 12/12/2017    Family History  Problem Relation Age of Onset  . Cancer Mother  COLON AND UTERINE  . Cancer Father        PROSTATE  . Diabetes Father   . Hypertension Father   . Stroke Father   . Arthritis Sister   . Diabetes Son   . Breast cancer Neg Hx     Social History   Socioeconomic History  . Marital status: Married    Spouse name: Legrand Como  . Number of children: 1  . Years of education: Not on file  . Highest education level: Bachelor's degree (e.g., BA, AB, BS)  Occupational History  . Occupation: Retired  Scientific laboratory technician  . Financial resource strain: Not hard at all  . Food insecurity    Worry: Never true    Inability: Never true  . Transportation needs    Medical: No    Non-medical: No   Tobacco Use  . Smoking status: Never Smoker  . Smokeless tobacco: Never Used  . Tobacco comment: smoking cessation materials not required  Substance and Sexual Activity  . Alcohol use: No  . Drug use: No  . Sexual activity: Not Currently    Partners: Male    Birth control/protection: Surgical  Lifestyle  . Physical activity    Days per week: 3 days    Minutes per session: 60 min  . Stress: Not at all  Relationships  . Social connections    Talks on phone: More than three times a week    Gets together: Three times a week    Attends religious service: More than 4 times per year    Active member of club or organization: Yes    Attends meetings of clubs or organizations: 1 to 4 times per year    Relationship status: Married  . Intimate partner violence    Fear of current or ex partner: No    Emotionally abused: No    Physically abused: No    Forced sexual activity: No  Other Topics Concern  . Not on file  Social History Narrative  . Not on file    Review of Systems: See HPI, otherwise negative ROS  Physical Exam: BP 110/79   Pulse 75   Temp (!) 96.5 F (35.8 C) (Tympanic)   Resp 16   Ht 5\' 4"  (1.626 m)   Wt 59 kg   LMP  (LMP Unknown)   BMI 22.31 kg/m  General:   Alert,  pleasant and cooperative in NAD Head:  Normocephalic and atraumatic. Neck:  Supple; no masses or thyromegaly. Lungs:  Clear throughout to auscultation, normal respiratory effort.    Heart:  +S1, +S2, Regular rate and rhythm, No edema. Abdomen:  Soft, nontender and nondistended. Normal bowel sounds, without guarding, and without rebound.   Neurologic:  Alert and  oriented x4;  grossly normal neurologically.  Impression/Plan: [name] is here for a colonoscopy to be performed for high risk screening.  Risks, benefits, limitations, and alternatives regarding  colonoscopy have been reviewed with the patient.  Questions have been answered.  All parties agreeable.   Virgel Manifold, MD   11/28/2018, 10:26 AM

## 2018-11-28 NOTE — Anesthesia Preprocedure Evaluation (Signed)
Anesthesia Evaluation  Patient identified by MRN, date of birth, ID band Patient awake    Reviewed: Allergy & Precautions, NPO status , Patient's Chart, lab work & pertinent test results  History of Anesthesia Complications Negative for: history of anesthetic complications  Airway Mallampati: II  TM Distance: >3 FB Neck ROM: Full    Dental no notable dental hx.    Pulmonary neg pulmonary ROS, neg sleep apnea, neg COPD,    breath sounds clear to auscultation- rhonchi (-) wheezing      Cardiovascular hypertension, (-) CAD, (-) Past MI, (-) Cardiac Stents and (-) CABG  Rhythm:Regular Rate:Normal - Systolic murmurs and - Diastolic murmurs    Neuro/Psych neg Seizures negative neurological ROS  negative psych ROS   GI/Hepatic negative GI ROS, Neg liver ROS,   Endo/Other  negative endocrine ROSneg diabetes  Renal/GU negative Renal ROS     Musculoskeletal  (+) Arthritis ,   Abdominal (+) - obese,   Peds  Hematology negative hematology ROS (+)   Anesthesia Other Findings Past Medical History: No date: Arthritis     Comment:  in back 2011: Cataract     Comment:  remove from right eye 10/28/2015: Dyslipidemia, goal LDL below 130 10/28/2015: Hormone replacement therapy (postmenopausal) No date: Hyperlipidemia No date: Hypertension No date: Osteopenia 12/05/2016: Patient is full code No date: TMJ (dislocation of temporomandibular joint)   Reproductive/Obstetrics                             Anesthesia Physical Anesthesia Plan  ASA: II  Anesthesia Plan: General   Post-op Pain Management:    Induction: Intravenous  PONV Risk Score and Plan: 2 and Propofol infusion  Airway Management Planned: Natural Airway  Additional Equipment:   Intra-op Plan:   Post-operative Plan:   Informed Consent: I have reviewed the patients History and Physical, chart, labs and discussed the procedure  including the risks, benefits and alternatives for the proposed anesthesia with the patient or authorized representative who has indicated his/her understanding and acceptance.     Dental advisory given  Plan Discussed with: CRNA and Anesthesiologist  Anesthesia Plan Comments:         Anesthesia Quick Evaluation

## 2018-11-28 NOTE — Op Note (Signed)
[facility] Patient Name: [name] Procedure Date: 11/28/2018 10:19 AM MRN: [id number] Account #: [id number] Date of Birth: [birth year] Admit Type: Outpatient Age: 73 Room: [facility] ENDO ROOM 1 Gender: Female Note Status: Finalized Procedure:            Colonoscopy Indications:          Screening in patient at increased risk: Family history                        of 1st-degree relative with colorectal cancer Providers:             B. Bonna Gains MD, MD Medicines:            Monitored Anesthesia Care Complications:        No immediate complications. Procedure:            Pre-Anesthesia Assessment:                       - Prior to the procedure, a History and Physical was                        performed, and patient medications, allergies and                        sensitivities were reviewed. The patient's tolerance of                        previous anesthesia was reviewed.                       - The risks and benefits of the procedure and the                        sedation options and risks were discussed with the                        patient. All questions were answered and informed                        consent was obtained.                       - Patient identification and proposed procedure were                        verified prior to the procedure by the physician, the                        nurse, the anesthetist and the technician. The                        procedure was verified in the pre-procedure area in the                        procedure room in the endoscopy suite.                       - ASA Grade Assessment: II - A patient with mild                        systemic  disease.                       - After reviewing the risks and benefits, the patient                        was deemed in satisfactory condition to undergo the                        procedure.                       After obtaining informed consent, the  colonoscope was                        passed under direct vision. Throughout the procedure,                        the patient's blood pressure, pulse, and oxygen                        saturations were monitored continuously. The                        Colonoscope was introduced through the anus and                        advanced to the the cecum, identified by appendiceal                        orifice and ileocecal valve. The colonoscopy was                        performed with ease. The patient tolerated the                        procedure well. The quality of the bowel preparation                        was good. Findings:      The perianal and digital rectal examinations were normal.      Two sessile polyps were found in the ascending colon and cecum. The       polyps were 3 to 4 mm in size. These polyps were removed with a cold       biopsy forceps. Resection and retrieval were complete.      Two small localized angioectasias without bleeding were found in the       ascending colon and in the cecum.      Multiple diverticula were found in the sigmoid colon.      The exam was otherwise without abnormality.      The rectum, sigmoid colon, descending colon, transverse colon, ascending       colon and cecum appeared normal.      Anal papilla(e) were hypertrophied.      The retroflexed view of the distal rectum and anal verge was normal and       showed no anal or rectal abnormalities. Impression:           - Two 3 to 4 mm polyps in the ascending colon and in  the cecum, removed with a cold biopsy forceps. Resected                        and retrieved.                       - Two non-bleeding colonic angioectasias.                       - Diverticulosis in the sigmoid colon.                       - The examination was otherwise normal.                       - The rectum, sigmoid colon, descending colon,                        transverse colon, ascending  colon and cecum are normal.                       - Anal papilla(e) were hypertrophied.                       - The distal rectum and anal verge are normal on                        retroflexion view. Recommendation:       - Await pathology results.                       - Discharge patient to home.                       - Resume previous diet.                       - Continue present medications.                       - Repeat colonoscopy in 5 years for screening purposes.                       - Return to primary care physician as previously                        scheduled.                       - The findings and recommendations were discussed with                        the patient.                       - The findings and recommendations were discussed with                        the patient's family.                       - High fiber diet. Procedure Code(s):    --- Professional ---  45380, Colonoscopy, flexible; with biopsy, single or                        multiple Diagnosis Code(s):    --- Professional ---                       Z80.0, Family history of malignant neoplasm of                        digestive organs                       K63.5, Polyp of colon                       K55.20, Angiodysplasia of colon without hemorrhage                       K62.89, Other specified diseases of anus and rectum                       K57.30, Diverticulosis of large intestine without                        perforation or abscess without bleeding CPT copyright 2019 American Medical Association. All rights reserved. The codes documented in this report are preliminary and upon coder review may  be revised to meet current compliance requirements.  Vonda Antigua, MD Margretta Sidle B. Bonna Gains MD, MD 11/28/2018 11:21:23 AM This report has been signed electronically. Number of Addenda: 0 Note Initiated On: 11/28/2018 10:19 AM Scope Withdrawal Time: 0 hours 26 minutes 8  seconds  Total Procedure Duration: 0 hours 35 minutes 44 seconds  Estimated Blood Loss: Estimated blood loss: none.      [facility]

## 2018-11-28 NOTE — Anesthesia Postprocedure Evaluation (Signed)
Anesthesia Post Note  Patient: [name]  Procedure(s) Performed: COLONOSCOPY WITH PROPOFOL (N/A )  Patient location during evaluation: PACU Anesthesia Type: General Level of consciousness: awake and alert and oriented Pain management: pain level controlled Vital Signs Assessment: post-procedure vital signs reviewed and stable Respiratory status: spontaneous breathing, nonlabored ventilation and respiratory function stable Cardiovascular status: blood pressure returned to baseline and stable Postop Assessment: no signs of nausea or vomiting Anesthetic complications: no     Last Vitals:  Vitals:   11/28/18 1136 11/28/18 1146  BP: 106/70 130/65  Pulse: 64 62  Resp: 20 15  Temp:    SpO2: 100% 99%    Last Pain:  Vitals:   11/28/18 1146  TempSrc:   PainSc: 0-No pain                 Kalid Ghan

## 2018-11-28 NOTE — Anesthesia Procedure Notes (Signed)
Date/Time: 11/28/2018 10:31 AM Performed by: Johnna Acosta, CRNA Pre-anesthesia Checklist: Emergency Drugs available, Patient identified, Suction available, Patient being monitored and Timeout performed Patient Re-evaluated:Patient Re-evaluated prior to induction Oxygen Delivery Method: Nasal cannula Preoxygenation: Pre-oxygenation with 100% oxygen Induction Type: IV induction

## 2018-11-28 NOTE — Transfer of Care (Signed)
Immediate Anesthesia Transfer of Care Note  Patient: [name]  Procedure(s) Performed: COLONOSCOPY WITH PROPOFOL (N/A )  Patient Location: PACU  Anesthesia Type:General  Level of Consciousness: awake, alert  and drowsy  Airway & Oxygen Therapy: Patient Spontanous Breathing and Patient connected to nasal cannula oxygen  Post-op Assessment: Report given to RN and Post -op Vital signs reviewed and stable  Post vital signs: Reviewed and stable  Last Vitals:  Vitals Value Taken Time  BP 102/70 11/28/18 1116  Temp 36.2 C 11/28/18 1116  Pulse 70 11/28/18 1118  Resp 13 11/28/18 1118  SpO2 100 % 11/28/18 1118  Vitals shown include unvalidated device data.  Last Pain:  Vitals:   11/28/18 1116  TempSrc: Tympanic  PainSc: Asleep         Complications: No apparent anesthesia complications

## 2018-11-29 ENCOUNTER — Encounter: Payer: Self-pay | Admitting: Gastroenterology

## 2018-11-29 NOTE — Progress Notes (Signed)
   11/27/18  CC:  Chief Complaint  Patient presents with  . Cysto    HPI: 73 year old female with recurrent severe urinary tract infections resulting in sepsis over the past year who presents today for cystoscopy to rule out any underlying bladder pathology.  She also underwent renal ultrasound on 11/20/2018 which was unremarkable without evidence of hydronephrosis.  There was mild increased echogenicity bilaterally consistent with possible medical renal disease.  Since last visit, she said no changes.  She does have mild stress baseline urinary stress incontinence.  She is currently on hormonal supplementation.  Blood pressure 127/78, pulse 73, height 5\' 4"  (1.626 m), weight 131 lb (59.4 kg). NED. A&Ox3.   No respiratory distress   Abd soft, NT, ND Normal external genitalia with patent urethral meatus, demonstrable stress urinary incontinence with Valsalva Normal vaginal vault, fairly good support without significant prolapse.  Recently well estrogenized tissue without obvious vaginitis.  Cystoscopy Procedure Note  Patient identification was confirmed, informed consent was obtained, and patient was prepped using Betadine solution.  Lidocaine jelly was administered per urethral meatus.    Procedure: - Flexible cystoscope introduced, without any difficulty.   - Thorough search of the bladder revealed:    normal urethral meatus    normal urothelium    no stones    no ulcers     no tumors    no urethral polyps    no trabeculation Bowel peristalsis readily visible  - Ureteral orifices were normal in position and appearance.  Post-Procedure: - Patient tolerated the procedure well  Assessment/ Plan:   1. Recurrent UTI Etiology of recurrent infections unclear Reviewed probiotic and cranberry tablets in addition to estrogen which she already is taking Not bothered by stress incontinence She is advised to return as needed if she has any signs or symptoms of infection in the future  Would not recommend suppressive antibiotics at this time No upper tract pathology identified on renal ultrasound Cystoscopy today is also unremarkable She was given prophylactic antibiotic x1 given her history of profound presentation for prophylaxis - Urinalysis, Complete - ciprofloxacin (CIPRO) tablet 500 mg   Hollice Espy, MD

## 2018-11-30 LAB — SURGICAL PATHOLOGY

## 2018-12-07 ENCOUNTER — Telehealth: Payer: Self-pay

## 2018-12-07 ENCOUNTER — Other Ambulatory Visit: Payer: Self-pay | Admitting: Family Medicine

## 2018-12-07 NOTE — Telephone Encounter (Signed)
Pt following up on this refill request for  EST ESTROGENS-METHYLTEST DS 1.25-2.5 MG TABS  Pt states she as not founf another pcp yet, but she should not have to see a dr to have this refilled, because she has been on for yrs. Pt requesting this be sent asap to     [facility] # [address], [place] [phone] (Phone) [phone] (Fax)

## 2018-12-10 NOTE — Telephone Encounter (Signed)
Needs routine appointment in 3 months  

## 2018-12-13 ENCOUNTER — Encounter: Payer: PPO | Admitting: Family Medicine

## 2018-12-14 ENCOUNTER — Telehealth: Payer: Self-pay

## 2018-12-14 NOTE — Telephone Encounter (Signed)
Copied from [place] [id number]. Topic: Medical Record Request - Patient ROI Request >> Dec 13, 2018 11:28 AM Burchel, Abbi R wrote:  Requestor Name/Agency: [facility] Call Back #: [phone] Information Requested: Most recent OV notes/labs/imaging   Route to [place] for [place] clinics. For all other clinics, route to the clinic's PEC Pool.   I called [facility], spoke to [place], to find out what this is regarding since we did not refer this patient and have not seen her since May and she was unaware of why we got this call as well. She did not see where this person was a patient at that clinic. I told her thanks and that I will disregard.

## 2018-12-14 NOTE — Telephone Encounter (Signed)
Copied from [place] [id number]. Topic: Medical Record Request - Patient ROI Request >> Dec 13, 2018 11:28 AM Burchel, Abbi R wrote:  Requestor Name/Agency: [facility] Call Back #: [phone] Information Requested: Most recent OV notes/labs/imaging   Route to [place] for [place] clinics. For all other clinics, route to the clinic's PEC Pool.

## 2018-12-17 ENCOUNTER — Other Ambulatory Visit: Payer: Self-pay | Admitting: Family Medicine

## 2018-12-18 NOTE — Telephone Encounter (Signed)
Pt calling to check on this.  States that she is getting ready to go to [facility] and needs refill.

## 2018-12-18 NOTE — Telephone Encounter (Signed)
Called pt back to explain to her about Care Everywhere

## 2018-12-18 NOTE — Telephone Encounter (Signed)
Pt called back to check on this.  She is trying to establish care with [facility] and needs her records sent prior to them accepting her care. Pt needs to speak with someone at this office to let her know what is going on. Pt can be reached at [phone]

## 2018-12-19 NOTE — Telephone Encounter (Signed)
I do not see anything documented as to whom she saw for this and who is prescribing this for her. This should be virtual if needed. If her trip is before we have availability please let me know.

## 2018-12-19 NOTE — Telephone Encounter (Signed)
Per elizabeth patient needs appt for med refill before her trip

## 2018-12-19 NOTE — Telephone Encounter (Signed)
appt scheduled with Benjamine Mola

## 2018-12-25 ENCOUNTER — Encounter: Payer: PPO | Admitting: Nurse Practitioner

## 2019-01-01 ENCOUNTER — Encounter: Payer: Self-pay | Admitting: Gastroenterology

## 2019-01-01 ENCOUNTER — Ambulatory Visit: Payer: PPO

## 2019-01-01 ENCOUNTER — Ambulatory Visit (INDEPENDENT_AMBULATORY_CARE_PROVIDER_SITE_OTHER): Payer: PPO | Admitting: Nurse Practitioner

## 2019-01-01 ENCOUNTER — Other Ambulatory Visit: Payer: Self-pay

## 2019-01-01 ENCOUNTER — Encounter: Payer: Self-pay | Admitting: Nurse Practitioner

## 2019-01-01 VITALS — BP 134/72 | HR 80 | Temp 97.1°F | Resp 16 | Ht 64.0 in | Wt 134.8 lb

## 2019-01-01 DIAGNOSIS — M8580 Other specified disorders of bone density and structure, unspecified site: Secondary | ICD-10-CM | POA: Diagnosis not present

## 2019-01-01 DIAGNOSIS — E785 Hyperlipidemia, unspecified: Secondary | ICD-10-CM

## 2019-01-01 DIAGNOSIS — R12 Heartburn: Secondary | ICD-10-CM | POA: Diagnosis not present

## 2019-01-01 DIAGNOSIS — Z298 Encounter for other specified prophylactic measures: Secondary | ICD-10-CM | POA: Diagnosis not present

## 2019-01-01 DIAGNOSIS — I10 Essential (primary) hypertension: Secondary | ICD-10-CM

## 2019-01-01 MED ORDER — CHLOROQUINE PHOSPHATE 250 MG PO TABS: 250.0000 mg | ORAL_TABLET | Freq: Every day | ORAL | 5 refills | Status: DC

## 2019-01-01 NOTE — Patient Instructions (Signed)
Good cholesterol, also called high-density lipoprotein (HDL) removes extra cholesterol and plaque buildup in your arteries and then sends it to your liver to get rid of and helps reduce your risk of heart disease, heart attack, and stroke.Foods that increase HDL: beans and legumes, whole grains, high-fiber fruits:prunes, apples, and pears; fatty fish- salmon, tuna, sardines; nuts, olive oil

## 2019-01-01 NOTE — Progress Notes (Signed)
Name: [name]   MRN: [id number]    DOB: Nov 21, 1945   Date:01/01/2019       Progress Note  Subjective  Chief Complaint  Chief Complaint  Patient presents with  . Hypertension    Denies any syptoms  . Follow-up    HPI States she is going to [place] for mission trip, has been going three times a year for the past 10 years. Usually goes for about 2 weeks.   Hypertension Patient is on chlorothalidone 25mg  daily.  Takes medications as prescribed with no missed doses a month.  She is compliant with low-salt diet.  Denies chest pain, headaches, blurry vision.  Hyperlipidemia Diet: eats vegetables daily, eats fried foods occasionally. Red meats once a week.   Lab Results  Component Value Date   CHOL 177 08/31/2018   HDL 39 (L) 08/31/2018   LDLCALC 114 (H) 08/31/2018   TRIG 126 08/31/2018   CHOLHDL 4.5 08/31/2018   GERD Takes pepcid daily; states heartburn is resolved now.  Treated for H.pylori earlier this year.   Osteopenia Takes vitamin D and calcium supplementation Is active.   PHQ2/9: Depression screen [facility] 2/9 01/01/2019 09/17/2018 05/24/2018 02/06/2018 01/02/2018  Decreased Interest 0 0 0 0 0  Down, Depressed, Hopeless 0 0 0 0 0  PHQ - 2 Score 0 0 0 0 0  Altered sleeping 0 0 0 0 0  Tired, decreased energy 0 0 0 0 0  Change in appetite 0 0 0 0 0  Feeling bad or failure about yourself  0 0 0 0 0  Trouble concentrating 0 0 0 0 0  Moving slowly or fidgety/restless 0 0 0 0 0  Suicidal thoughts 0 0 0 0 0  PHQ-9 Score 0 0 0 0 0  Difficult doing work/chores Not difficult at all Not difficult at all Not difficult at all Not difficult at all Not difficult at all    PHQ reviewed. Negative  Patient Active Problem List   Diagnosis Date Noted  . Polyp of ascending colon   . Angiodysplasia of intestinal tract   . Hypertrophy of anal papillae   . Diverticulosis of large intestine without diverticulitis   . Essential hypertension, benign 05/24/2018  . CLE (columnar  lined esophagus)   . Heartburn   . Osteopenia 12/12/2017  . Insomnia 04/10/2016  . Hypertensive retinopathy 02/15/2016  . Urine incontinence 02/15/2016  . Dyslipidemia, goal LDL below 100 10/28/2015  . Hormone replacement therapy (postmenopausal) 10/28/2015  . Postzoster neuralgia 05/15/2015    Past Medical History:  Diagnosis Date  . Arthritis    in back  . Cataract 2011   remove from right eye  . Dyslipidemia, goal LDL below 130 10/28/2015  . Hormone replacement therapy (postmenopausal) 10/28/2015  . Hyperlipidemia   . Hypertension   . Osteopenia   . Patient is full code 12/05/2016  . TMJ (dislocation of temporomandibular joint)     Past Surgical History:  Procedure Laterality Date  .  MESH SLING X 15 YRS    . ANTERIOR AND POSTERIOR REPAIR    . AUGMENTATION MAMMAPLASTY Bilateral    20 yrs ago , then replaced 5 yrs ago  . BLADDER SUSPENSION    . BREAST ENHANCEMENT SURGERY    . BREAST SURGERY  2012   BILATERAL BREAST IMPLANT REMOVAL  . COLONOSCOPY WITH PROPOFOL N/A 11/28/2018   Procedure: COLONOSCOPY WITH PROPOFOL;  Surgeon: Virgel Manifold, MD;  Location: [facility] END[facility]COPY;  Service: Endoscopy;  Laterality: N/A;  .  ESOPHAGOGASTRODUODENOSCOPY (EGD) WITH PROPOFOL N/A 05/04/2018   Procedure: ESOPHAGOGASTRODUODENOSCOPY (EGD) WITH PROPOFOL;  Surgeon: Virgel Manifold, MD;  Location: [facility] ENDOSCOPY;  Service: Endoscopy;  Laterality: N/A;  . EYE SURGERY  20114   right eye  . INCONTINENCE SURGERY    . NASAL SINUS SURGERY    . NOSE SURGERY    . VAGINAL HYSTERECTOMY     still has ovaries    Social History   Tobacco Use  . Smoking status: Never Smoker  . Smokeless tobacco: Never Used  . Tobacco comment: smoking cessation materials not required  Substance Use Topics  . Alcohol use: No     Current Outpatient Medications:  .  Calcium-Magnesium-Zinc 334-134-5 MG TABS, Take 1 tablet by mouth daily., Disp: , Rfl:  .  celecoxib (CELEBREX) 200 MG capsule, Take 200 mg by  mouth daily. , Disp: , Rfl:  .  chlorthalidone (HYGROTON) 25 MG tablet, Take 0.5-1 tablets (12.5-25 mg total) by mouth daily., Disp: 30 tablet, Rfl: 1 .  cholecalciferol (VITAMIN D) 1000 UNITS tablet, Take 1,000 Units by mouth daily., Disp: , Rfl:  .  Cranberry 500 MG CAPS, Take 1 capsule by mouth 2 (two) times daily., Disp: , Rfl:  .  D-Mannose POWD, Take 1 each by mouth daily. 1 teaspoon daily, Disp: , Rfl:  .  Est Estrogens-Methyltest DS 1.25-2.5 MG TABS, Take 1 tablet by mouth daily., Disp: 90 tablet, Rfl: 1 .  famotidine (PEPCID) 20 MG tablet, Take 1 tablet (20 mg total) by mouth daily., Disp: 90 tablet, Rfl: 0 .  gabapentin (NEURONTIN) 300 MG capsule, Take 1 capsule (300 mg total) by mouth at bedtime., Disp: 90 capsule, Rfl: 1 .  moxifloxacin (VIGAMOX) 0.5 % ophthalmic solution, , Disp: , Rfl:  .  Multiple Vitamin (MULTIVITAMIN) capsule, Take 1 capsule by mouth daily., Disp: , Rfl:  .  OLIVE LEAF EXTRACT PO, Take 1 tablet by mouth daily., Disp: , Rfl:  .  Omega 3-6-9 Fatty Acids (TRIPLE OMEGA-3-6-9) CAPS, Take 1 capsule by mouth daily. 1200 mg Omega daily, Disp: , Rfl:  .  Probiotic Product (PROBIOTIC PEARLS PO), Take 1 each by mouth daily. Softgel for women's vaginal and urinary tract health, Disp: , Rfl:  .  triamcinolone cream (KENALOG) 0.5 %, Apply 1 application topically 3 (three) times daily. As needed, Disp: 30 g, Rfl: 0 .  valACYclovir (VALTREX) 500 MG tablet, Take 1 tablet (500 mg total) by mouth 2 (two) times daily. For three days in a row during outbreaks., Disp: 20 tablet, Rfl: 0 .  chloroquine (ARALEN) 250 MG tablet, Take 1 tablet (250 mg total) by mouth daily., Disp: 30 tablet, Rfl: 5  Allergies  Allergen Reactions  . Tizanidine Other (See Comments)    Unable to sleep.    ROS    No other specific complaints in a complete review of systems (except as listed in HPI above).  Objective  Vitals:   01/01/19 1600  BP: 134/72  Pulse: 80  Resp: 16  Temp: (!) 97.1 F  (36.2 C)  TempSrc: Temporal  SpO2: 99%  Weight: 134 lb 12.8 oz (61.1 kg)  Height: 5\' 4"  (1.626 m)     Body mass index is 23.14 kg/m.  Nursing Note and Vital Signs reviewed.  Physical Exam Constitutional:      Appearance: Normal appearance. She is well-developed.  HENT:     Head: Normocephalic and atraumatic.     Right Ear: Hearing normal.     Left Ear: Hearing normal.  Eyes:     Conjunctiva/sclera: Conjunctivae normal.  Cardiovascular:     Rate and Rhythm: Normal rate and regular rhythm.     Heart sounds: Normal heart sounds.  Pulmonary:     Effort: Pulmonary effort is normal.     Breath sounds: Normal breath sounds.  Musculoskeletal: Normal range of motion.  Neurological:     Mental Status: She is alert and oriented to person, place, and time.  Psychiatric:        Speech: Speech normal.        Behavior: Behavior normal. Behavior is cooperative.        Thought Content: Thought content normal.        Judgment: Judgment normal.        No results found for this or any previous visit (from the past 48 hour(s)).  Assessment & Plan  1. Essential hypertension, benign stable  2. Dyslipidemia, goal LDL below 100 Discussed diet   3. Need for malaria prophylaxis - chloroquine (ARALEN) 250 MG tablet; Take 1 tablet (250 mg total) by mouth daily.  Dispense: 30 tablet; Refill: 5  4. Heartburn Controlled on famotidine.   5. Osteopenia, unspecified location Continue supplementation and weight bearing exercises

## 2019-01-02 DIAGNOSIS — R12 Heartburn: Secondary | ICD-10-CM | POA: Diagnosis not present

## 2019-01-02 DIAGNOSIS — M8580 Other specified disorders of bone density and structure, unspecified site: Secondary | ICD-10-CM | POA: Diagnosis not present

## 2019-01-02 DIAGNOSIS — E785 Hyperlipidemia, unspecified: Secondary | ICD-10-CM | POA: Diagnosis not present

## 2019-01-02 DIAGNOSIS — Z1321 Encounter for screening for nutritional disorder: Secondary | ICD-10-CM | POA: Diagnosis not present

## 2019-01-02 DIAGNOSIS — K5730 Diverticulosis of large intestine without perforation or abscess without bleeding: Secondary | ICD-10-CM | POA: Diagnosis not present

## 2019-01-02 DIAGNOSIS — K228 Other specified diseases of esophagus: Secondary | ICD-10-CM | POA: Diagnosis not present

## 2019-01-02 DIAGNOSIS — Z7189 Other specified counseling: Secondary | ICD-10-CM | POA: Diagnosis not present

## 2019-01-02 DIAGNOSIS — K635 Polyp of colon: Secondary | ICD-10-CM | POA: Diagnosis not present

## 2019-01-02 DIAGNOSIS — N289 Disorder of kidney and ureter, unspecified: Secondary | ICD-10-CM | POA: Diagnosis not present

## 2019-01-02 DIAGNOSIS — I10 Essential (primary) hypertension: Secondary | ICD-10-CM | POA: Diagnosis not present

## 2019-01-02 DIAGNOSIS — B0229 Other postherpetic nervous system involvement: Secondary | ICD-10-CM | POA: Diagnosis not present

## 2019-01-02 DIAGNOSIS — K6289 Other specified diseases of anus and rectum: Secondary | ICD-10-CM | POA: Diagnosis not present

## 2019-01-02 DIAGNOSIS — Z0000 Encounter for general adult medical examination without abnormal findings: Secondary | ICD-10-CM | POA: Diagnosis not present

## 2019-01-02 DIAGNOSIS — Z7689 Persons encountering health services in other specified circumstances: Secondary | ICD-10-CM | POA: Diagnosis not present

## 2019-01-02 DIAGNOSIS — Z131 Encounter for screening for diabetes mellitus: Secondary | ICD-10-CM | POA: Diagnosis not present

## 2019-01-02 DIAGNOSIS — D72829 Elevated white blood cell count, unspecified: Secondary | ICD-10-CM | POA: Diagnosis not present

## 2019-01-02 DIAGNOSIS — Z79890 Hormone replacement therapy (postmenopausal): Secondary | ICD-10-CM | POA: Diagnosis not present

## 2019-01-02 DIAGNOSIS — K5520 Angiodysplasia of colon without hemorrhage: Secondary | ICD-10-CM | POA: Diagnosis not present

## 2019-01-22 ENCOUNTER — Other Ambulatory Visit: Payer: Self-pay | Admitting: Gerontology

## 2019-01-22 DIAGNOSIS — Z1231 Encounter for screening mammogram for malignant neoplasm of breast: Secondary | ICD-10-CM

## 2019-02-06 ENCOUNTER — Ambulatory Visit
Admission: RE | Admit: 2019-02-06 | Discharge: 2019-02-06 | Disposition: A | Discharge status: Home / Self Care | Payer: PPO | Source: Ambulatory Visit | Attending: Gerontology | Admitting: Gerontology

## 2019-02-06 ENCOUNTER — Other Ambulatory Visit: Payer: Self-pay

## 2019-02-06 DIAGNOSIS — Z1231 Encounter for screening mammogram for malignant neoplasm of breast: Secondary | ICD-10-CM | POA: Insufficient documentation

## 2019-03-06 ENCOUNTER — Ambulatory Visit: Payer: PPO | Admitting: Internal Medicine

## 2019-04-10 DIAGNOSIS — Z79890 Hormone replacement therapy (postmenopausal): Secondary | ICD-10-CM | POA: Diagnosis not present

## 2019-04-10 DIAGNOSIS — R32 Unspecified urinary incontinence: Secondary | ICD-10-CM | POA: Diagnosis not present

## 2019-04-10 DIAGNOSIS — I10 Essential (primary) hypertension: Secondary | ICD-10-CM | POA: Diagnosis not present

## 2019-04-10 DIAGNOSIS — R918 Other nonspecific abnormal finding of lung field: Secondary | ICD-10-CM | POA: Diagnosis not present

## 2019-04-12 DIAGNOSIS — R918 Other nonspecific abnormal finding of lung field: Secondary | ICD-10-CM | POA: Insufficient documentation

## 2019-06-24 DIAGNOSIS — R918 Other nonspecific abnormal finding of lung field: Secondary | ICD-10-CM | POA: Diagnosis not present

## 2019-06-24 DIAGNOSIS — N3281 Overactive bladder: Secondary | ICD-10-CM | POA: Insufficient documentation

## 2019-06-24 DIAGNOSIS — R32 Unspecified urinary incontinence: Secondary | ICD-10-CM | POA: Diagnosis not present

## 2019-06-24 DIAGNOSIS — E785 Hyperlipidemia, unspecified: Secondary | ICD-10-CM | POA: Diagnosis not present

## 2019-06-24 DIAGNOSIS — R12 Heartburn: Secondary | ICD-10-CM | POA: Diagnosis not present

## 2019-06-24 DIAGNOSIS — I10 Essential (primary) hypertension: Secondary | ICD-10-CM | POA: Diagnosis not present

## 2019-06-24 DIAGNOSIS — B0229 Other postherpetic nervous system involvement: Secondary | ICD-10-CM | POA: Diagnosis not present

## 2019-06-28 DIAGNOSIS — E042 Nontoxic multinodular goiter: Secondary | ICD-10-CM | POA: Diagnosis not present

## 2019-06-28 DIAGNOSIS — R918 Other nonspecific abnormal finding of lung field: Secondary | ICD-10-CM | POA: Diagnosis not present

## 2019-07-12 DIAGNOSIS — E042 Nontoxic multinodular goiter: Secondary | ICD-10-CM | POA: Diagnosis not present

## 2019-07-22 DIAGNOSIS — D0462 Carcinoma in situ of skin of left upper limb, including shoulder: Secondary | ICD-10-CM | POA: Diagnosis not present

## 2019-07-22 DIAGNOSIS — D485 Neoplasm of uncertain behavior of skin: Secondary | ICD-10-CM | POA: Diagnosis not present

## 2019-07-22 DIAGNOSIS — X32XXXA Exposure to sunlight, initial encounter: Secondary | ICD-10-CM | POA: Diagnosis not present

## 2019-07-22 DIAGNOSIS — L814 Other melanin hyperpigmentation: Secondary | ICD-10-CM | POA: Diagnosis not present

## 2019-07-22 DIAGNOSIS — Z85828 Personal history of other malignant neoplasm of skin: Secondary | ICD-10-CM | POA: Diagnosis not present

## 2019-07-22 DIAGNOSIS — D2262 Melanocytic nevi of left upper limb, including shoulder: Secondary | ICD-10-CM | POA: Diagnosis not present

## 2019-07-22 DIAGNOSIS — D2261 Melanocytic nevi of right upper limb, including shoulder: Secondary | ICD-10-CM | POA: Diagnosis not present

## 2019-07-22 DIAGNOSIS — L570 Actinic keratosis: Secondary | ICD-10-CM | POA: Diagnosis not present

## 2019-07-22 DIAGNOSIS — D2272 Melanocytic nevi of left lower limb, including hip: Secondary | ICD-10-CM | POA: Diagnosis not present

## 2019-08-01 DIAGNOSIS — E042 Nontoxic multinodular goiter: Secondary | ICD-10-CM | POA: Diagnosis not present

## 2019-08-01 DIAGNOSIS — Z79899 Other long term (current) drug therapy: Secondary | ICD-10-CM | POA: Diagnosis not present

## 2019-08-01 DIAGNOSIS — I10 Essential (primary) hypertension: Secondary | ICD-10-CM | POA: Diagnosis not present

## 2019-08-05 DIAGNOSIS — E042 Nontoxic multinodular goiter: Secondary | ICD-10-CM | POA: Diagnosis not present

## 2019-08-13 DIAGNOSIS — H401131 Primary open-angle glaucoma, bilateral, mild stage: Secondary | ICD-10-CM | POA: Diagnosis not present

## 2019-08-13 DIAGNOSIS — H1045 Other chronic allergic conjunctivitis: Secondary | ICD-10-CM | POA: Diagnosis not present

## 2019-08-13 DIAGNOSIS — H26491 Other secondary cataract, right eye: Secondary | ICD-10-CM | POA: Diagnosis not present

## 2019-08-13 DIAGNOSIS — Z961 Presence of intraocular lens: Secondary | ICD-10-CM | POA: Diagnosis not present

## 2019-08-13 DIAGNOSIS — H35039 Hypertensive retinopathy, unspecified eye: Secondary | ICD-10-CM | POA: Diagnosis not present

## 2019-08-13 DIAGNOSIS — H04129 Dry eye syndrome of unspecified lacrimal gland: Secondary | ICD-10-CM | POA: Diagnosis not present

## 2019-08-15 DIAGNOSIS — E042 Nontoxic multinodular goiter: Secondary | ICD-10-CM | POA: Diagnosis not present

## 2019-08-28 DIAGNOSIS — D0462 Carcinoma in situ of skin of left upper limb, including shoulder: Secondary | ICD-10-CM | POA: Diagnosis not present

## 2019-10-08 DIAGNOSIS — R7303 Prediabetes: Secondary | ICD-10-CM | POA: Diagnosis not present

## 2019-10-08 DIAGNOSIS — R519 Headache, unspecified: Secondary | ICD-10-CM | POA: Diagnosis not present

## 2019-10-08 DIAGNOSIS — R918 Other nonspecific abnormal finding of lung field: Secondary | ICD-10-CM | POA: Diagnosis not present

## 2019-10-08 DIAGNOSIS — E042 Nontoxic multinodular goiter: Secondary | ICD-10-CM | POA: Insufficient documentation

## 2019-10-08 DIAGNOSIS — I10 Essential (primary) hypertension: Secondary | ICD-10-CM | POA: Diagnosis not present

## 2019-10-17 DIAGNOSIS — R7303 Prediabetes: Secondary | ICD-10-CM | POA: Diagnosis not present

## 2019-10-17 DIAGNOSIS — E042 Nontoxic multinodular goiter: Secondary | ICD-10-CM | POA: Diagnosis not present

## 2019-10-17 DIAGNOSIS — I10 Essential (primary) hypertension: Secondary | ICD-10-CM | POA: Diagnosis not present

## 2019-12-18 DIAGNOSIS — Z0000 Encounter for general adult medical examination without abnormal findings: Secondary | ICD-10-CM | POA: Diagnosis not present

## 2019-12-30 DIAGNOSIS — D485 Neoplasm of uncertain behavior of skin: Secondary | ICD-10-CM | POA: Diagnosis not present

## 2019-12-30 DIAGNOSIS — D225 Melanocytic nevi of trunk: Secondary | ICD-10-CM | POA: Diagnosis not present

## 2019-12-30 DIAGNOSIS — X32XXXA Exposure to sunlight, initial encounter: Secondary | ICD-10-CM | POA: Diagnosis not present

## 2019-12-30 DIAGNOSIS — Z85828 Personal history of other malignant neoplasm of skin: Secondary | ICD-10-CM | POA: Diagnosis not present

## 2019-12-30 DIAGNOSIS — D2261 Melanocytic nevi of right upper limb, including shoulder: Secondary | ICD-10-CM | POA: Diagnosis not present

## 2019-12-30 DIAGNOSIS — L820 Inflamed seborrheic keratosis: Secondary | ICD-10-CM | POA: Diagnosis not present

## 2019-12-30 DIAGNOSIS — L570 Actinic keratosis: Secondary | ICD-10-CM | POA: Diagnosis not present

## 2019-12-30 DIAGNOSIS — D2262 Melanocytic nevi of left upper limb, including shoulder: Secondary | ICD-10-CM | POA: Diagnosis not present

## 2019-12-30 DIAGNOSIS — L538 Other specified erythematous conditions: Secondary | ICD-10-CM | POA: Diagnosis not present

## 2019-12-30 DIAGNOSIS — D2272 Melanocytic nevi of left lower limb, including hip: Secondary | ICD-10-CM | POA: Diagnosis not present

## 2020-01-21 ENCOUNTER — Other Ambulatory Visit: Payer: Self-pay | Admitting: Gerontology

## 2020-01-21 ENCOUNTER — Other Ambulatory Visit: Payer: Self-pay | Admitting: Pediatrics

## 2020-01-21 DIAGNOSIS — E042 Nontoxic multinodular goiter: Secondary | ICD-10-CM | POA: Diagnosis not present

## 2020-02-19 DIAGNOSIS — Z1239 Encounter for other screening for malignant neoplasm of breast: Secondary | ICD-10-CM | POA: Diagnosis not present

## 2020-02-19 DIAGNOSIS — Z1231 Encounter for screening mammogram for malignant neoplasm of breast: Secondary | ICD-10-CM | POA: Diagnosis not present

## 2020-03-10 DIAGNOSIS — Z23 Encounter for immunization: Secondary | ICD-10-CM | POA: Diagnosis not present

## 2020-05-26 IMAGING — MG DIGITAL SCREENING BILATERAL MAMMOGRAM WITH IMPLANTS, CAD AND TOM
9 of 17 series · 9 of 33 positions shown · non-contrast
Comparison: Previous exam(s).

CLINICAL DATA: Screening.

EXAM:
DIGITAL SCREENING BILATERAL MAMMOGRAM WITH IMPLANTS, CAD AND TOMO
The patient has retropectoral implants. Standard and implant
displaced views were performed.

[R MLO]
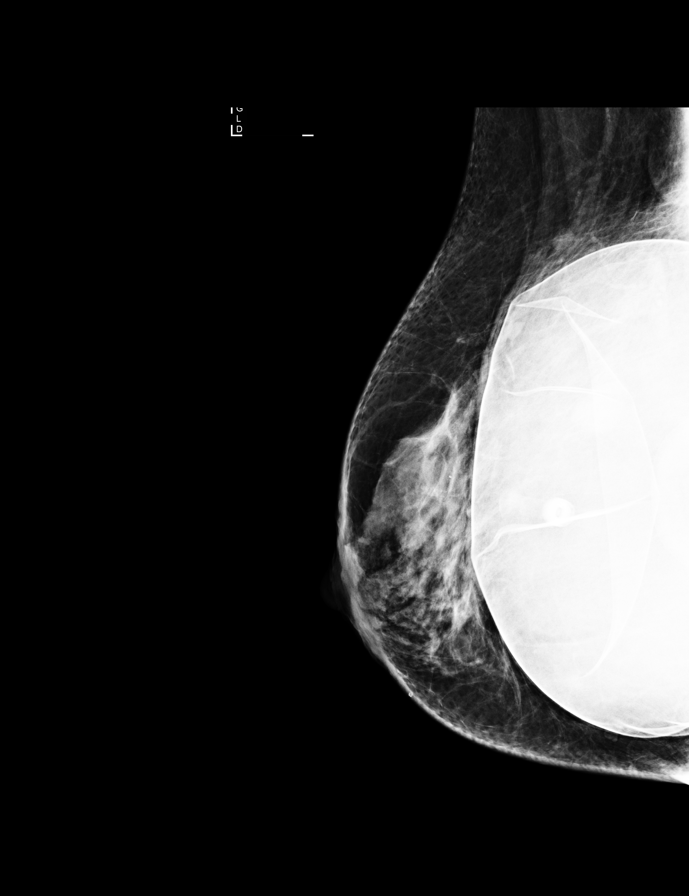

[L CC (1 of 3)]
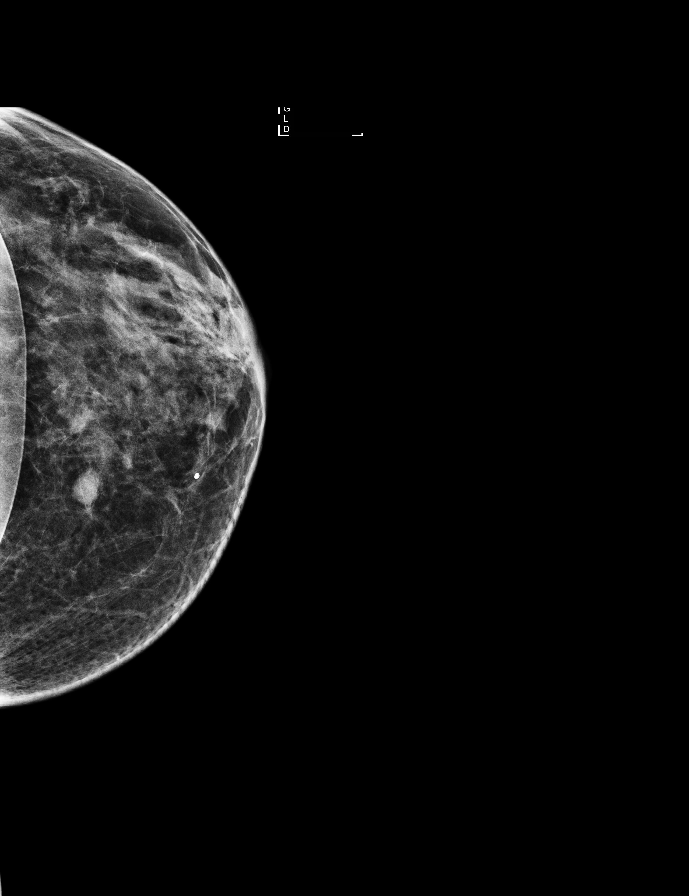

[R CC]
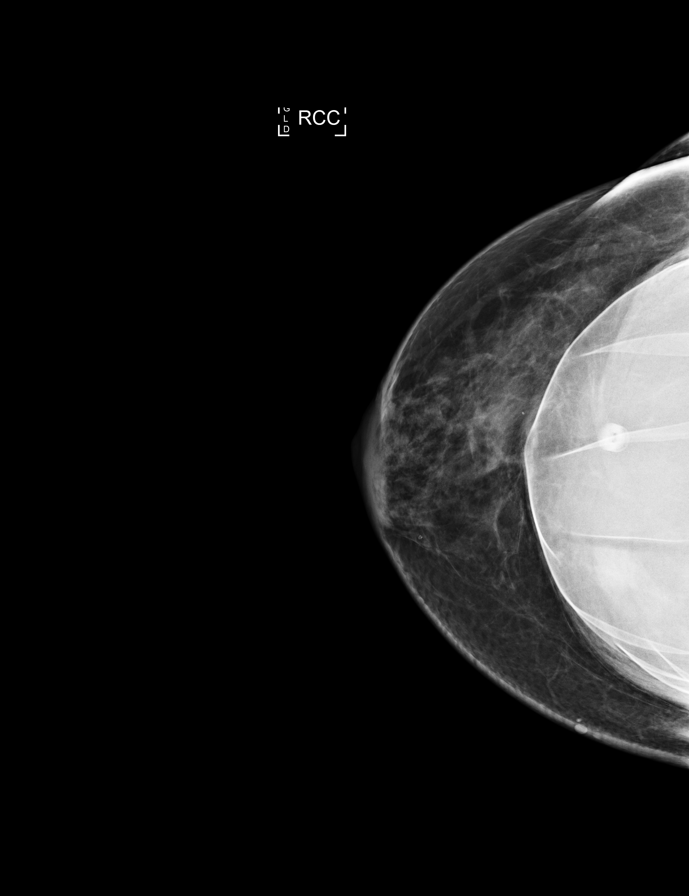

[L CC (2 of 3)]
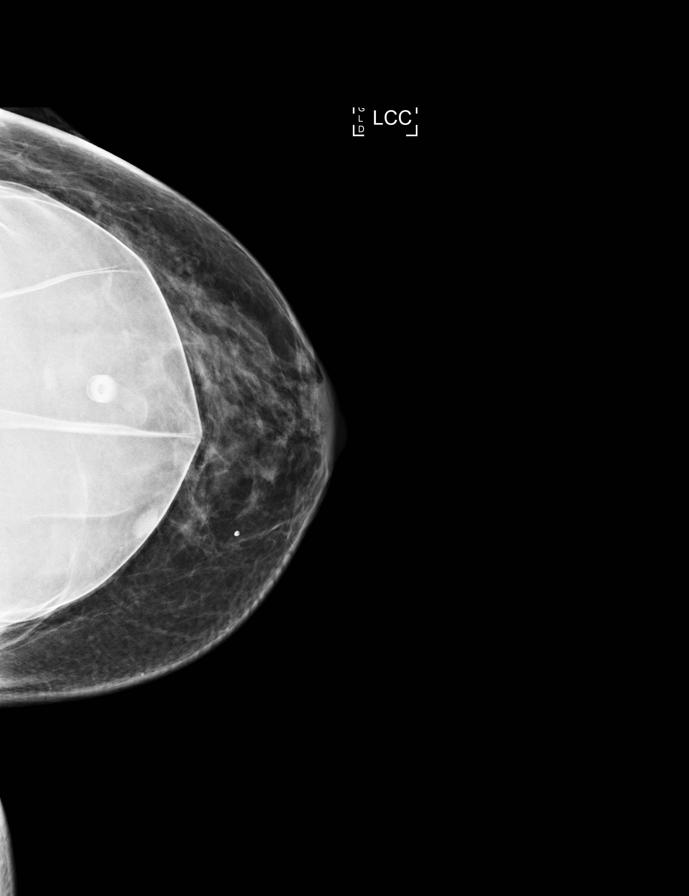

[L MLO (1 of 2)]
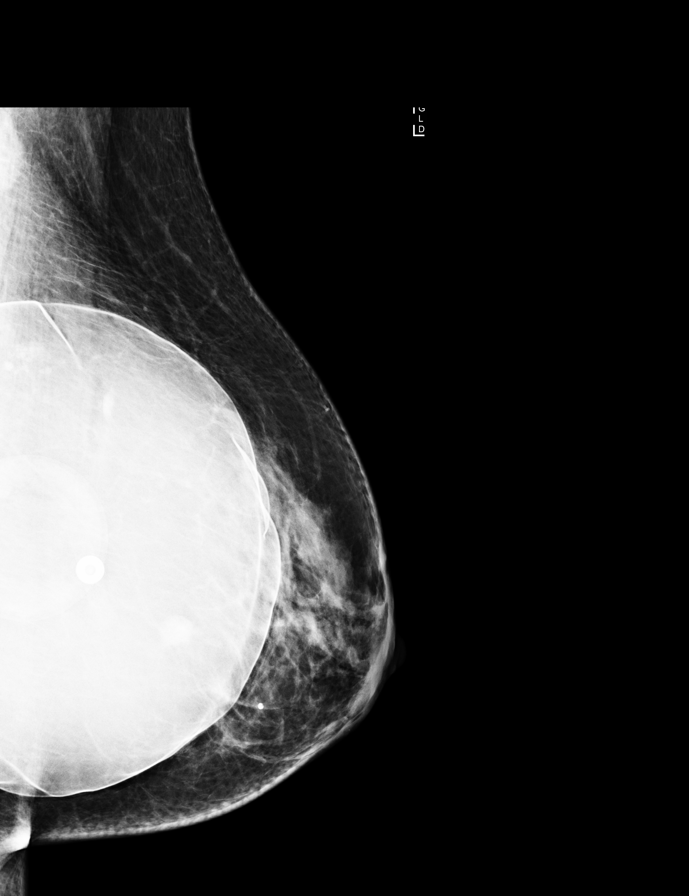

[R CC synth-2D]
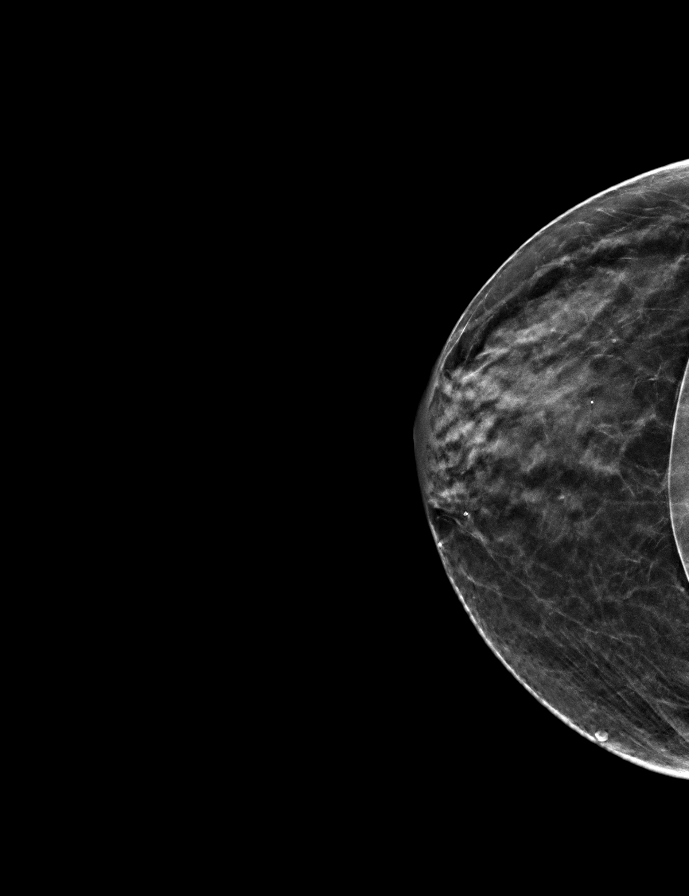

[L MLO synth-2D]
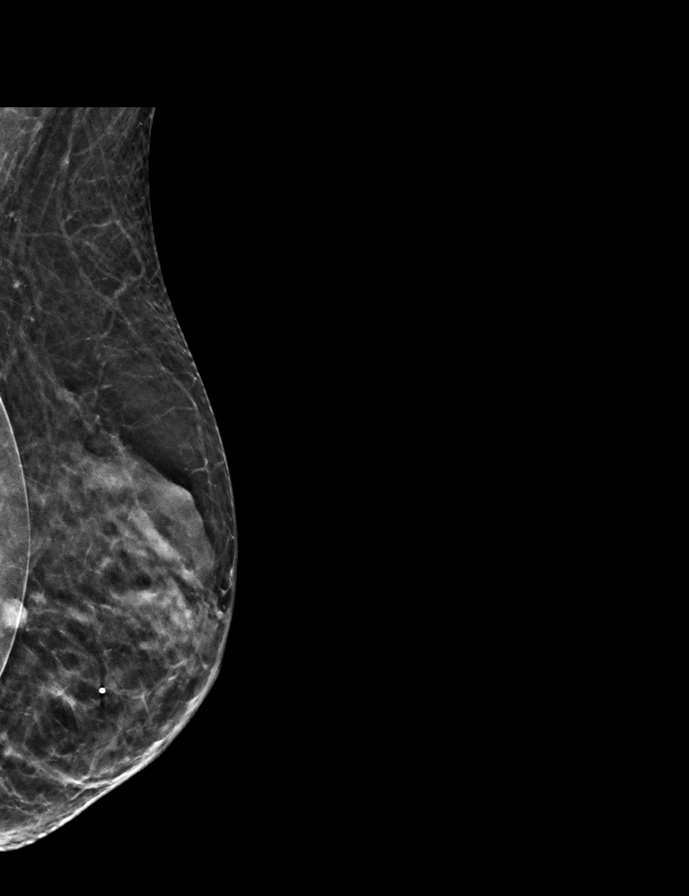

[L MLO (2 of 2)]
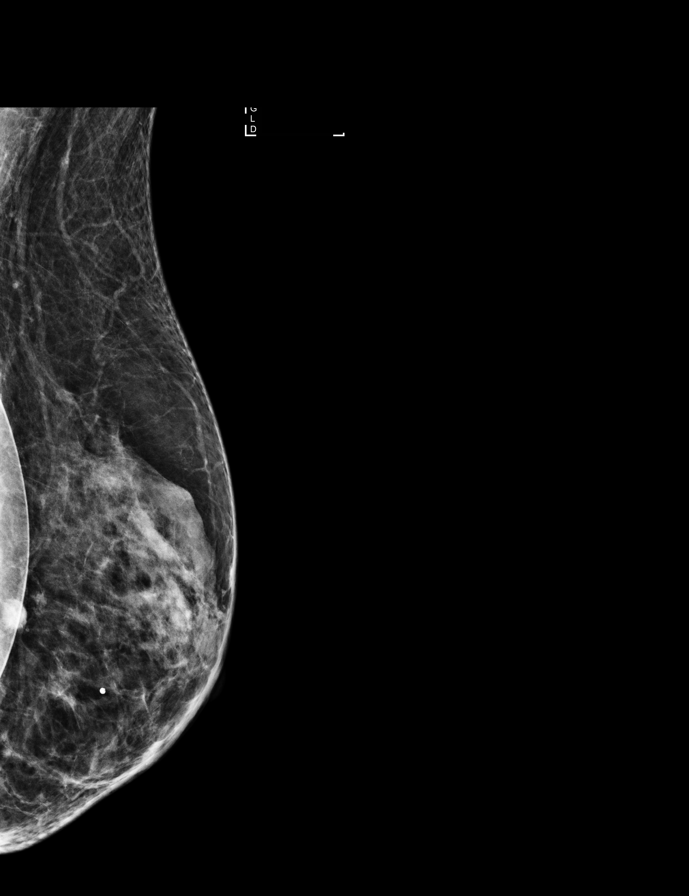

[L CC (3 of 3)]
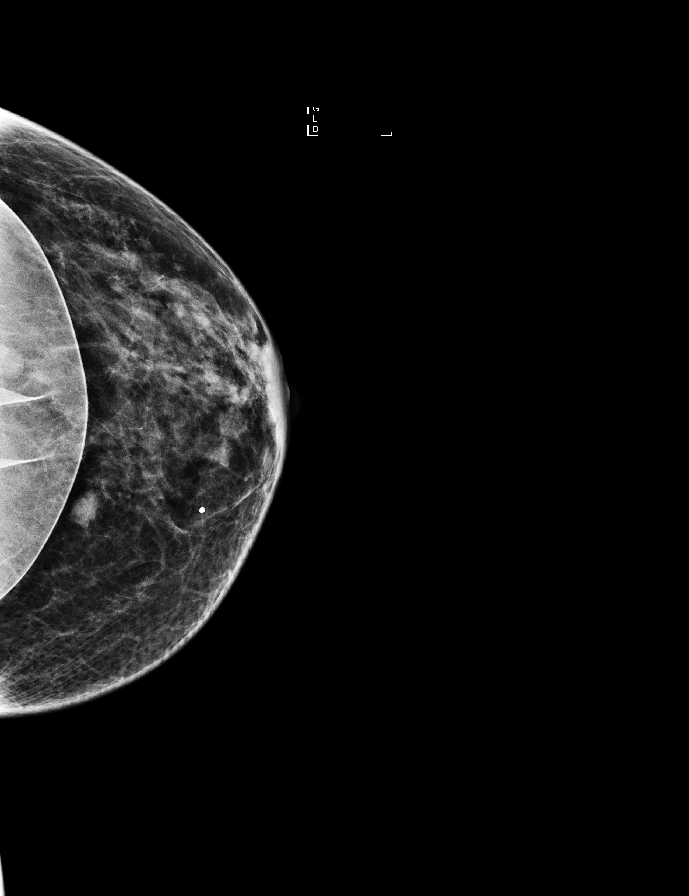

[9 of 33 positions shown; findings below may reference images not displayed]

ACR Breast Density Category c: The breast tissue is heterogeneously
dense, which may obscure small masses.
FINDINGS: There are no findings suspicious for malignancy. Images were
processed with CAD.
IMPRESSION: No mammographic evidence of malignancy. A result letter of this
screening mammogram will be mailed directly to the patient.

RECOMMENDATION:
Screening mammogram in one year. (Code:49-X-OQ9)

BI-RADS CATEGORY  1:  Negative.

## 2020-08-05 DIAGNOSIS — I10 Essential (primary) hypertension: Secondary | ICD-10-CM | POA: Diagnosis not present

## 2020-08-05 DIAGNOSIS — Z0000 Encounter for general adult medical examination without abnormal findings: Secondary | ICD-10-CM | POA: Diagnosis not present

## 2020-08-05 DIAGNOSIS — E042 Nontoxic multinodular goiter: Secondary | ICD-10-CM | POA: Diagnosis not present

## 2020-08-05 DIAGNOSIS — N3281 Overactive bladder: Secondary | ICD-10-CM | POA: Diagnosis not present

## 2020-08-05 DIAGNOSIS — K219 Gastro-esophageal reflux disease without esophagitis: Secondary | ICD-10-CM | POA: Diagnosis not present

## 2020-08-05 DIAGNOSIS — M8580 Other specified disorders of bone density and structure, unspecified site: Secondary | ICD-10-CM | POA: Diagnosis not present

## 2020-08-05 DIAGNOSIS — G4700 Insomnia, unspecified: Secondary | ICD-10-CM | POA: Diagnosis not present

## 2020-08-05 DIAGNOSIS — E785 Hyperlipidemia, unspecified: Secondary | ICD-10-CM | POA: Diagnosis not present

## 2020-08-05 DIAGNOSIS — Z780 Asymptomatic menopausal state: Secondary | ICD-10-CM | POA: Diagnosis not present

## 2020-08-05 DIAGNOSIS — M2550 Pain in unspecified joint: Secondary | ICD-10-CM | POA: Insufficient documentation

## 2020-08-05 DIAGNOSIS — B0229 Other postherpetic nervous system involvement: Secondary | ICD-10-CM | POA: Diagnosis not present

## 2020-08-05 DIAGNOSIS — F419 Anxiety disorder, unspecified: Secondary | ICD-10-CM | POA: Insufficient documentation

## 2020-08-12 DIAGNOSIS — H35039 Hypertensive retinopathy, unspecified eye: Secondary | ICD-10-CM | POA: Diagnosis not present

## 2020-08-12 DIAGNOSIS — Z961 Presence of intraocular lens: Secondary | ICD-10-CM | POA: Diagnosis not present

## 2020-08-12 DIAGNOSIS — H1045 Other chronic allergic conjunctivitis: Secondary | ICD-10-CM | POA: Diagnosis not present

## 2020-08-12 DIAGNOSIS — H04129 Dry eye syndrome of unspecified lacrimal gland: Secondary | ICD-10-CM | POA: Diagnosis not present

## 2020-08-12 DIAGNOSIS — H40013 Open angle with borderline findings, low risk, bilateral: Secondary | ICD-10-CM | POA: Diagnosis not present

## 2020-08-12 DIAGNOSIS — H26491 Other secondary cataract, right eye: Secondary | ICD-10-CM | POA: Diagnosis not present

## 2020-09-15 DIAGNOSIS — D225 Melanocytic nevi of trunk: Secondary | ICD-10-CM | POA: Diagnosis not present

## 2020-09-15 DIAGNOSIS — Z85828 Personal history of other malignant neoplasm of skin: Secondary | ICD-10-CM | POA: Diagnosis not present

## 2020-09-15 DIAGNOSIS — L570 Actinic keratosis: Secondary | ICD-10-CM | POA: Diagnosis not present

## 2020-09-15 DIAGNOSIS — D2261 Melanocytic nevi of right upper limb, including shoulder: Secondary | ICD-10-CM | POA: Diagnosis not present

## 2020-09-15 DIAGNOSIS — X32XXXA Exposure to sunlight, initial encounter: Secondary | ICD-10-CM | POA: Diagnosis not present

## 2020-09-15 DIAGNOSIS — D2262 Melanocytic nevi of left upper limb, including shoulder: Secondary | ICD-10-CM | POA: Diagnosis not present

## 2020-09-15 DIAGNOSIS — D485 Neoplasm of uncertain behavior of skin: Secondary | ICD-10-CM | POA: Diagnosis not present

## 2020-09-15 DIAGNOSIS — B078 Other viral warts: Secondary | ICD-10-CM | POA: Diagnosis not present

## 2020-09-15 DIAGNOSIS — D2271 Melanocytic nevi of right lower limb, including hip: Secondary | ICD-10-CM | POA: Diagnosis not present

## 2020-10-19 DIAGNOSIS — H40013 Open angle with borderline findings, low risk, bilateral: Secondary | ICD-10-CM | POA: Diagnosis not present

## 2020-10-19 DIAGNOSIS — H26491 Other secondary cataract, right eye: Secondary | ICD-10-CM | POA: Diagnosis not present

## 2020-10-19 DIAGNOSIS — H04123 Dry eye syndrome of bilateral lacrimal glands: Secondary | ICD-10-CM | POA: Diagnosis not present

## 2020-10-19 DIAGNOSIS — H35033 Hypertensive retinopathy, bilateral: Secondary | ICD-10-CM | POA: Diagnosis not present

## 2020-11-05 DIAGNOSIS — R8290 Unspecified abnormal findings in urine: Secondary | ICD-10-CM | POA: Diagnosis not present

## 2020-11-05 DIAGNOSIS — I10 Essential (primary) hypertension: Secondary | ICD-10-CM | POA: Diagnosis not present

## 2020-11-05 DIAGNOSIS — E785 Hyperlipidemia, unspecified: Secondary | ICD-10-CM | POA: Diagnosis not present

## 2020-11-05 DIAGNOSIS — F32A Depression, unspecified: Secondary | ICD-10-CM | POA: Insufficient documentation

## 2020-11-05 DIAGNOSIS — B359 Dermatophytosis, unspecified: Secondary | ICD-10-CM | POA: Diagnosis not present

## 2021-01-08 DIAGNOSIS — R55 Syncope and collapse: Secondary | ICD-10-CM | POA: Diagnosis not present

## 2021-01-08 DIAGNOSIS — R5383 Other fatigue: Secondary | ICD-10-CM | POA: Insufficient documentation

## 2021-01-08 DIAGNOSIS — F419 Anxiety disorder, unspecified: Secondary | ICD-10-CM | POA: Diagnosis not present

## 2021-01-13 DIAGNOSIS — Z0000 Encounter for general adult medical examination without abnormal findings: Secondary | ICD-10-CM | POA: Diagnosis not present

## 2021-01-20 DIAGNOSIS — R5383 Other fatigue: Secondary | ICD-10-CM | POA: Diagnosis not present

## 2021-01-20 DIAGNOSIS — R55 Syncope and collapse: Secondary | ICD-10-CM | POA: Diagnosis not present

## 2021-02-11 DIAGNOSIS — R55 Syncope and collapse: Secondary | ICD-10-CM | POA: Diagnosis not present

## 2021-02-23 DIAGNOSIS — M170 Bilateral primary osteoarthritis of knee: Secondary | ICD-10-CM | POA: Diagnosis not present

## 2021-03-01 DIAGNOSIS — M25561 Pain in right knee: Secondary | ICD-10-CM | POA: Insufficient documentation

## 2021-03-03 DIAGNOSIS — I3139 Other pericardial effusion (noninflammatory): Secondary | ICD-10-CM | POA: Diagnosis not present

## 2021-03-03 DIAGNOSIS — H539 Unspecified visual disturbance: Secondary | ICD-10-CM | POA: Diagnosis not present

## 2021-03-03 DIAGNOSIS — I471 Supraventricular tachycardia: Secondary | ICD-10-CM | POA: Diagnosis not present

## 2021-03-03 DIAGNOSIS — I440 Atrioventricular block, first degree: Secondary | ICD-10-CM | POA: Diagnosis not present

## 2021-03-03 DIAGNOSIS — E785 Hyperlipidemia, unspecified: Secondary | ICD-10-CM | POA: Diagnosis not present

## 2021-03-03 DIAGNOSIS — R55 Syncope and collapse: Secondary | ICD-10-CM | POA: Diagnosis not present

## 2021-03-03 DIAGNOSIS — I10 Essential (primary) hypertension: Secondary | ICD-10-CM | POA: Diagnosis not present

## 2021-03-03 DIAGNOSIS — R002 Palpitations: Secondary | ICD-10-CM | POA: Diagnosis not present

## 2021-04-06 DIAGNOSIS — M25561 Pain in right knee: Secondary | ICD-10-CM | POA: Diagnosis not present

## 2021-04-19 DIAGNOSIS — M8580 Other specified disorders of bone density and structure, unspecified site: Secondary | ICD-10-CM | POA: Diagnosis not present

## 2021-04-19 DIAGNOSIS — I10 Essential (primary) hypertension: Secondary | ICD-10-CM | POA: Diagnosis not present

## 2021-04-19 DIAGNOSIS — M1990 Unspecified osteoarthritis, unspecified site: Secondary | ICD-10-CM | POA: Insufficient documentation

## 2021-04-19 DIAGNOSIS — F32A Depression, unspecified: Secondary | ICD-10-CM | POA: Diagnosis not present

## 2021-04-19 DIAGNOSIS — F419 Anxiety disorder, unspecified: Secondary | ICD-10-CM | POA: Diagnosis not present

## 2021-04-19 DIAGNOSIS — L602 Onychogryphosis: Secondary | ICD-10-CM | POA: Diagnosis not present

## 2021-04-19 DIAGNOSIS — B0229 Other postherpetic nervous system involvement: Secondary | ICD-10-CM | POA: Diagnosis not present

## 2021-04-19 DIAGNOSIS — E785 Hyperlipidemia, unspecified: Secondary | ICD-10-CM | POA: Diagnosis not present

## 2021-04-19 DIAGNOSIS — Z79890 Hormone replacement therapy (postmenopausal): Secondary | ICD-10-CM | POA: Diagnosis not present

## 2022-09-22 DIAGNOSIS — S92322A Displaced fracture of second metatarsal bone, left foot, initial encounter for closed fracture: Secondary | ICD-10-CM | POA: Insufficient documentation

## 2022-09-27 DIAGNOSIS — R600 Localized edema: Secondary | ICD-10-CM | POA: Insufficient documentation

## 2022-09-27 DIAGNOSIS — S93629A Sprain of tarsometatarsal ligament of unspecified foot, initial encounter: Secondary | ICD-10-CM | POA: Insufficient documentation

## 2022-09-27 DIAGNOSIS — S9032XA Contusion of left foot, initial encounter: Secondary | ICD-10-CM | POA: Insufficient documentation

## 2022-09-27 DIAGNOSIS — S92243A Displaced fracture of medial cuneiform of unspecified foot, initial encounter for closed fracture: Secondary | ICD-10-CM | POA: Insufficient documentation

## 2022-10-25 DIAGNOSIS — R600 Localized edema: Secondary | ICD-10-CM | POA: Insufficient documentation

## 2023-01-23 DIAGNOSIS — N393 Stress incontinence (female) (male): Secondary | ICD-10-CM | POA: Insufficient documentation

## 2023-02-20 ENCOUNTER — Ambulatory Visit: Payer: PPO | Admitting: Internal Medicine

## 2023-04-11 DIAGNOSIS — M79672 Pain in left foot: Secondary | ICD-10-CM | POA: Insufficient documentation

## 2023-04-25 DIAGNOSIS — M7742 Metatarsalgia, left foot: Secondary | ICD-10-CM | POA: Insufficient documentation

## 2023-04-27 ENCOUNTER — Ambulatory Visit (INDEPENDENT_AMBULATORY_CARE_PROVIDER_SITE_OTHER): Payer: PPO | Admitting: Internal Medicine

## 2023-04-27 ENCOUNTER — Encounter: Payer: Self-pay | Admitting: Internal Medicine

## 2023-04-27 VITALS — BP 136/80 | HR 59 | Temp 98.0°F | Resp 16 | Ht 64.0 in | Wt 143.5 lb

## 2023-04-27 DIAGNOSIS — R7989 Other specified abnormal findings of blood chemistry: Secondary | ICD-10-CM | POA: Diagnosis not present

## 2023-04-27 DIAGNOSIS — R0982 Postnasal drip: Secondary | ICD-10-CM | POA: Insufficient documentation

## 2023-04-27 DIAGNOSIS — I491 Atrial premature depolarization: Secondary | ICD-10-CM

## 2023-04-27 DIAGNOSIS — Z1231 Encounter for screening mammogram for malignant neoplasm of breast: Secondary | ICD-10-CM

## 2023-04-27 DIAGNOSIS — G4700 Insomnia, unspecified: Secondary | ICD-10-CM

## 2023-04-27 DIAGNOSIS — E782 Mixed hyperlipidemia: Secondary | ICD-10-CM

## 2023-04-27 DIAGNOSIS — R052 Subacute cough: Secondary | ICD-10-CM

## 2023-04-27 DIAGNOSIS — K219 Gastro-esophageal reflux disease without esophagitis: Secondary | ICD-10-CM

## 2023-04-27 DIAGNOSIS — Z8673 Personal history of transient ischemic attack (TIA), and cerebral infarction without residual deficits: Secondary | ICD-10-CM | POA: Insufficient documentation

## 2023-04-27 MED ORDER — AZITHROMYCIN 250 MG PO TABS: ORAL_TABLET | ORAL | 0 refills | Status: AC

## 2023-04-27 MED ORDER — GABAPENTIN 100 MG PO CAPS: ORAL_CAPSULE | ORAL | 0 refills | Status: DC

## 2023-04-27 MED ORDER — FLUTICASONE PROPIONATE 50 MCG/ACT NA SUSP: 2.0000 | Freq: Two times a day (BID) | NASAL | 0 refills | Status: DC

## 2023-04-27 NOTE — Assessment & Plan Note (Signed)
Recommend treatment with nasal steroids, advised to use 2 sprays on each side twice a day to reduce inflammation in sinuses, will recheck at follow up.

## 2023-04-27 NOTE — Assessment & Plan Note (Signed)
Following with cardiology, currently wearing Zio patch. Symptoms could be related to thyroid.

## 2023-04-27 NOTE — Progress Notes (Signed)
New Patient Office Visit  Subjective    Patient ID: [name], female    DOB: April 02, 1946  Age: 77 y.o. MRN: [id number]  CC:  Chief Complaint  Patient presents with   Establish Care    Specialist: cardio currently on zio heart monitor, ortho,derm, urology   chest congestion    Several months, cough, tickly throat. Had aspiration pneumonia last yr and is concerend   Medical Management of Chronic Issues    HPI [name] presents to establish care. Had been a patient previously year ago.   Patient has been dealing with postnasal drip and productive cough for several weeks now.  She does have a history of aspiration pneumonia in January 2024 after aspirating a capsule while taking medication.  She was treated with an antibiotic at that time but since then she has been having more sinus drainage.  Usually her sinus drainage and postnasal drip is clear, however for the last 6 weeks it has been green with thick mucus.  She states she will have coughing fits that will cause shortness of breath but denies wheezing.  No fevers or upper respiratory symptoms.  She is taking DayQuil and Mucinex for her symptoms.  Hx of Hypertension/Atrial Ectopy: -Following with Cardiology, last seen 04/24/23 -Medications: nothing currently -Had been on Amlodipine but discontinued recently after blood pressure had been normal at home for a few weeks -Patient is compliant with above medications and reports no side effects. -Denies any SOB, CP, vision changes, LE edema or symptoms of hypotension -Will have episodes of dissociation as well as tiredness and weakness - will only last a few seconds but is currently wearing a zio monitor for evaluation -Had echo 10/24 EF >55% -CTA 10/24 negative for coronary artery disease  HLD/Hx of TIA: -Medications: Nothing -Failed Medications: Lipitor, discontinued to myalgias  -Last lipid panel: Lipid Panel     Component Value Date/Time   CHOL 177 08/31/2018 0915    CHOL 219 (H) 10/29/2015 0805   TRIG 126 08/31/2018 0915   HDL 39 (L) 08/31/2018 0915   HDL 71 10/29/2015 0805   CHOLHDL 4.5 08/31/2018 0915   VLDL 11 12/09/2016 0823   LDLCALC 114 (H) 08/31/2018 0915   LABVLDL 11 10/29/2015 0805  -Hx of TIA in 2022   Low TSH: -TSH in 9/24 low at 0.535, had been diagnosed with subclinical thyroiditis at the time -Had a Thyroid US 9/21, which showed heterogeneous thyroid and unchanged right inferior TI-RADS 4 nodule which was biopsied 08/05/2019 which showed atypia of undetermined significance.  There is also a stable right superior TI-RADS 3 and left mid TI-RADS 2 nodule that did not require further workup.  GERD: -Currently on Famotidine 20 mg, taking PRN (about every other day) -Denies heartburn symptoms, abdominal pain, nausea or vomiting  History of urinary incontinence: -The patient just underwent a transurethral bulking injection on 03/16/2023 -She was seen postoperatively by her urogynecologist on 04/25/2023 and so far doing really well  Patient had been given a prescription for Gabapentin for postherpetic neuralgia after having shingles but she continues to take it to help her sleep. She wants to discuss a tapering plan.   Health Maintenance: -Blood work due -Mammogram due -Colonoscopy 11/2018 - repeat in 5 year  Outpatient Encounter Medications as of 04/27/2023  Medication Sig   azithromycin (ZITHROMAX) 250 MG tablet Take 2 tablets on day 1, then 1 tablet daily on days 2 through 5   Calcium-Magnesium-Zinc 334-134-5 MG TABS Take 1 tablet  by mouth daily.   celecoxib (CELEBREX) 200 MG capsule Take 200 mg by mouth daily.    Cranberry 500 MG CAPS Take 1 capsule by mouth 2 (two) times daily.   D-Mannose POWD Take 1 each by mouth daily. 1 teaspoon daily   Est Estrogens-Methyltest DS 1.25-2.5 MG TABS Take 1 tablet by mouth daily.   famotidine (PEPCID) 20 MG tablet Take 1 tablet (20 mg total) by mouth daily.   fluticasone (FLONASE) 50 MCG/ACT  nasal spray Place 2 sprays into both nostrils 2 (two) times daily.   gabapentin (NEURONTIN) 100 MG capsule Take 200 mg at night for 2 weeks, then 100 mg at night for 2 weeks, then 100 mg every other night for 2 weeks   Omega 3-6-9 Fatty Acids (TRIPLE OMEGA-3-6-9) CAPS Take 1 capsule by mouth daily. 1200 mg Omega daily   Probiotic Product (PROBIOTIC PEARLS PO) Take 1 each by mouth daily. Softgel for women's vaginal and urinary tract health   [DISCONTINUED] gabapentin (NEURONTIN) 300 MG capsule Take 1 capsule (300 mg total) by mouth at bedtime.   [DISCONTINUED] valACYclovir (VALTREX) 500 MG tablet Take 1 tablet (500 mg total) by mouth 2 (two) times daily. For three days in a row during outbreaks.   [DISCONTINUED] chloroquine (ARALEN) 250 MG tablet Take 1 tablet (250 mg total) by mouth daily.   [DISCONTINUED] chlorthalidone (HYGROTON) 25 MG tablet Take 0.5-1 tablets (12.5-25 mg total) by mouth daily.   [DISCONTINUED] cholecalciferol (VITAMIN D) 1000 UNITS tablet Take 1,000 Units by mouth daily.   [DISCONTINUED] moxifloxacin (VIGAMOX) 0.5 % ophthalmic solution    [DISCONTINUED] Multiple Vitamin (MULTIVITAMIN) capsule Take 1 capsule by mouth daily.   [DISCONTINUED] OLIVE LEAF EXTRACT PO Take 1 tablet by mouth daily.   [DISCONTINUED] triamcinolone cream (KENALOG) 0.5 % Apply 1 application topically 3 (three) times daily. As needed   No facility-administered encounter medications on file as of 04/27/2023.    Past Medical History:  Diagnosis Date   Arthritis    in back   Cataract 2011   remove from right eye   Hormone replacement therapy (postmenopausal) 10/28/2015   Mixed hyperlipidemia 10/28/2015   Osteopenia    Patient is full code 12/05/2016   TMJ (dislocation of temporomandibular joint)     Past Surgical History:  Procedure Laterality Date    MESH SLING X 15 YRS     ANTERIOR AND POSTERIOR REPAIR     AUGMENTATION MAMMAPLASTY Bilateral    20 yrs ago , then replaced 5 yrs ago   BLADDER  SUSPENSION     BREAST ENHANCEMENT SURGERY     BREAST SURGERY  2012   BILATERAL BREAST IMPLANT REMOVAL   COLONOSCOPY WITH PROPOFOL N/A 11/28/2018   Procedure: COLONOSCOPY WITH PROPOFOL;  Surgeon: Pasty Spillers, MD;  Location: [facility] END[facility]COPY;  Service: Endoscopy;  Laterality: N/A;   ESOPHAGOGASTRODUODENOSCOPY (EGD) WITH PROPOFOL N/A 05/04/2018   Procedure: ESOPHAGOGASTRODUODENOSCOPY (EGD) WITH PROPOFOL;  Surgeon: Pasty Spillers, MD;  Location: [facility] END[facility]COPY;  Service: Endoscopy;  Laterality: N/A;   EYE SURGERY  20114   right eye   INCONTINENCE SURGERY     NASAL SINUS SURGERY     NOSE SURGERY     VAGINAL HYSTERECTOMY     still has ovaries    Family History  Problem Relation Age of Onset   Cancer Mother        COLON AND UTERINE   Cancer Father        PROSTATE   Diabetes Father    Hypertension  Father    Stroke Father    Arthritis Sister    Diabetes Son    Breast cancer Neg Hx     Social History   Socioeconomic History   Marital status: Married    Spouse name: Casimiro Needle   Number of children: 1   Years of education: Not on file   Highest education level: Bachelor's degree (e.g., BA, AB, BS)  Occupational History   Occupation: Retired  Tobacco Use   Smoking status: Never   Smokeless tobacco: Never   Tobacco comments:    smoking cessation materials not required  Vaping Use   Vaping status: Never Used  Substance and Sexual Activity   Alcohol use: No   Drug use: No   Sexual activity: Not Currently    Partners: Male    Birth control/protection: Surgical  Other Topics Concern   Not on file  Social History Narrative   Not on file   Social Drivers of Health   Financial Resource Strain: Low Risk  (06/20/2022)   Received from [facility]   Overall Financial Resource Strain (CARDIA)    Difficulty of Paying Living Expenses: Not hard at all  Food Insecurity: No Food Insecurity (06/20/2022)   Received from [facility]   Hunger Vital Sign    Worried About  Running Out of Food in the Last Year: Never true    Ran Out of Food in the Last Year: Never true  Transportation Needs: Unmet Transportation Needs (11/03/2022)   Received from [facility]   PRAPARE - Transportation    Lack of Transportation (Medical): Yes    Lack of Transportation (Non-Medical): Yes  Physical Activity: Insufficiently Active (03/02/2022)   Received from [facility]   Exercise Vital Sign    Days of Exercise per Week: 3 days    Minutes of Exercise per Session: 40 min  Stress: No Stress Concern Present (03/02/2022)   Received from [facility] - Occupational Stress Questionnaire    Feeling of Stress : Not at all  Social Connections: Moderately Integrated (03/02/2022)   Received from [facility]   Social Connection and Isolation Panel [NHANES]    Frequency of Communication with Friends and Family: More than three times a week    Frequency of Social Gatherings with Friends and Family: More than three times a week    Attends Religious Services: More than 4 times per year    Active Member of [employer] or Organizations: Yes    Attends Banker Meetings: More than 4 times per year    Marital Status: Widowed  Intimate Partner Violence: Not At Risk (03/02/2022)   Received from [facility]   Humiliation, Afraid, Rape, and Kick questionnaire    Fear of Current or Ex-Partner: No    Emotionally Abused: No    Physically Abused: No    Sexually Abused: No    Review of Systems  Constitutional:  Negative for chills and fever.  HENT:  Positive for sore throat. Negative for congestion, ear pain and sinus pain.   Respiratory:  Positive for cough, sputum production and shortness of breath. Negative for wheezing.   Cardiovascular:  Negative for chest pain.  Gastrointestinal:  Negative for abdominal pain, heartburn, nausea and vomiting.  Genitourinary:  Negative for dysuria.        Objective    BP 136/80    Pulse (!) 59   Temp 98 F (36.7 C)  Resp 16   Ht 5\' 4"  (1.626 m)   Wt 143 lb 8 oz (65.1 kg)   LMP  (LMP Unknown)   SpO2 97%   BMI 24.63 kg/m   Physical Exam Constitutional:      Appearance: Normal appearance.  HENT:     Head: Normocephalic and atraumatic.     Right Ear: Tympanic membrane, ear canal and external ear normal.     Left Ear: Tympanic membrane, ear canal and external ear normal.     Nose: Nose normal.     Mouth/Throat:     Mouth: Mucous membranes are moist.     Comments: Mild PND Eyes:     Conjunctiva/sclera: Conjunctivae normal.  Cardiovascular:     Rate and Rhythm: Normal rate and regular rhythm.  Pulmonary:     Effort: Pulmonary effort is normal.     Breath sounds: Normal breath sounds. No wheezing, rhonchi or rales.     Comments: Decreased air sounds throughout Skin:    General: Skin is warm and dry.  Neurological:     General: No focal deficit present.     Mental Status: She is alert. Mental status is at baseline.  Psychiatric:        Mood and Affect: Mood normal.        Behavior: Behavior normal.         Assessment & Plan:  Subacute cough -     Azithromycin; Take 2 tablets on day 1, then 1 tablet daily on days 2 through 5  Dispense: 6 tablet; Refill: 0  Post-nasal drip Assessment & Plan: Recommend treatment with nasal steroids, advised to use 2 sprays on each side twice a day to reduce inflammation in sinuses, will recheck at follow up.   Orders: -     Fluticasone Propionate; Place 2 sprays into both nostrils 2 (two) times daily.  Dispense: 16 g; Refill: 0 -     Azithromycin; Take 2 tablets on day 1, then 1 tablet daily on days 2 through 5  Dispense: 6 tablet; Refill: 0  Low TSH level Assessment & Plan: Previously diagnosed with subacute thyroiditis, will recheck TSH with thyroid panel and check thyroid receptor antibodies to rule out Graves' disease.  Reviewed thyroid ultrasound from 2021, consider repeating.  Orders: -     Thyroid Panel  With TSH -     TRAb (TSH Receptor Binding Antibody)  Atrial ectopy Assessment & Plan: Following with cardiology, currently wearing Zio patch. Symptoms could be related to thyroid.   Mixed hyperlipidemia Assessment & Plan: Reviewed last lipid panel, patient not currently on medication as she cannot tolerate statins due to myalgias.   History of TIA (transient ischemic attack)  Gastroesophageal reflux disease, unspecified whether esophagitis present Assessment & Plan: Stable on famotidine 20 mg.   Insomnia, unspecified type Assessment & Plan: Currently taking gabapentin 300 mg at night for insomnia, wrote out a tapering plan for her to be off the medication in 6 weeks.  This was printed for the patient.  Orders: -     Gabapentin; Take 200 mg at night for 2 weeks, then 100 mg at night for 2 weeks, then 100 mg every other night for 2 weeks  Dispense: 49 capsule; Refill: 0  Encounter for screening mammogram for malignant neoplasm of breast -     3D Screening Mammogram, Left and Right; Future    Return in 3 months (on 07/26/2023).   Margarita Mail, DO

## 2023-04-27 NOTE — Patient Instructions (Addendum)
It was great seeing you today!  Plan discussed at today's visit: -Blood work ordered today, results will be uploaded to MyChart.  -Discussed with Cardiologist thoughts on primary prevention of heart attack and stroke with daily aspirin due to your history of TIA -Will decrease to 200 mg for 2 weeks, then decreased 100 mg for 2 weeks, then 100 mg every other night for 2 weeks, go off  -Treat post nasal drip with Flonase 2 sprays on each twice a day -Antibiotic prescribed for cough -Mammogram ordered, please call number on card to schedule  Follow up in: 3 months   Take care and let us know if you have any questions or concerns prior to your next visit.  Dr. Caralee Ates

## 2023-04-27 NOTE — Assessment & Plan Note (Signed)
Previously diagnosed with subacute thyroiditis, will recheck TSH with thyroid panel and check thyroid receptor antibodies to rule out Graves' disease.  Reviewed thyroid ultrasound from 2021, consider repeating.

## 2023-04-27 NOTE — Assessment & Plan Note (Signed)
Currently taking gabapentin 300 mg at night for insomnia, wrote out a tapering plan for her to be off the medication in 6 weeks.  This was printed for the patient.

## 2023-04-27 NOTE — Assessment & Plan Note (Signed)
Reviewed last lipid panel, patient not currently on medication as she cannot tolerate statins due to myalgias.

## 2023-04-27 NOTE — Assessment & Plan Note (Signed)
Stable on famotidine 20 mg.

## 2023-04-29 LAB — THYROID PANEL WITH TSH
Free Thyroxine Index: 1.8 (ref 1.4–3.8)
T3 Uptake: 32 % (ref 22–35)
T4, Total: 5.5 ug/dL (ref 5.1–11.9)
TSH: 0.57 m[IU]/L (ref 0.40–4.50)

## 2023-04-29 LAB — TRAB (TSH RECEPTOR BINDING ANTIBODY): TRAB: <1 [IU]/L (ref <=2.00)

## 2023-05-02 ENCOUNTER — Encounter: Payer: Self-pay | Admitting: Family Medicine

## 2023-05-02 ENCOUNTER — Ambulatory Visit
Admission: RE | Admit: 2023-05-02 | Discharge: 2023-05-02 | Disposition: A | Discharge status: Home / Self Care | Payer: PPO | Source: Ambulatory Visit | Attending: Family Medicine | Admitting: Family Medicine

## 2023-05-02 ENCOUNTER — Ambulatory Visit (INDEPENDENT_AMBULATORY_CARE_PROVIDER_SITE_OTHER): Payer: PPO | Admitting: Family Medicine

## 2023-05-02 ENCOUNTER — Ambulatory Visit
Admission: RE | Admit: 2023-05-02 | Discharge: 2023-05-02 | Disposition: A | Discharge status: Home / Self Care | Payer: PPO | Attending: Family Medicine | Admitting: Family Medicine

## 2023-05-02 VITALS — BP 104/64 | HR 80 | Temp 98.9°F | Resp 16 | Ht 64.0 in | Wt 143.5 lb

## 2023-05-02 DIAGNOSIS — R058 Other specified cough: Secondary | ICD-10-CM | POA: Insufficient documentation

## 2023-05-02 DIAGNOSIS — R6883 Chills (without fever): Secondary | ICD-10-CM | POA: Insufficient documentation

## 2023-05-02 DIAGNOSIS — Z8619 Personal history of other infectious and parasitic diseases: Secondary | ICD-10-CM

## 2023-05-02 DIAGNOSIS — J069 Acute upper respiratory infection, unspecified: Secondary | ICD-10-CM | POA: Diagnosis not present

## 2023-05-02 MED ORDER — CEFTRIAXONE SODIUM 1 G IJ SOLR: 1.0000 g | Freq: Once | INTRAMUSCULAR | Status: DC

## 2023-05-02 MED ORDER — LIDOCAINE HCL 1 % IJ SOLN
4.0000 mL | Freq: Once | INTRAMUSCULAR | Status: AC
  Administered 2023-05-02: 4 mL

## 2023-05-02 MED ORDER — CEFTRIAXONE SODIUM 500 MG IJ SOLR
500.0000 mg | Freq: Once | INTRAMUSCULAR | Status: AC
  Administered 2023-05-02: 1000 mg via INTRAMUSCULAR

## 2023-05-02 NOTE — Progress Notes (Signed)
Name: [name]   MRN: [id number]    DOB: Mar 10, 1946   Date:05/02/2023       Progress Note  Subjective  Chief Complaint  Chief Complaint  Patient presents with   Chills    Pt believes has UTI, has been taking AZO but no sx. Pt states a while back had a UTI with no sx an ended up at the clinic with sepsis.    HPI Discussed the use of AI scribe software for clinical note transcription with the patient, who gave verbal consent to proceed.  Discussed the use of AI scribe software for clinical note transcription with the patient, who gave verbal consent to proceed.  History of Present Illness   The patient, recently established with Dr. Caralee Ates, initially presented with an upper respiratory infection characterized by a persistent cough and phlegm production. The symptoms had been ongoing for several weeks, with post-nasal drip persisting throughout the year. The condition worsened following a cold, with the phlegm turning yellow and occasionally green, accompanied by convulsive coughing. The patient was prescribed a Z-Pak last week but just started taking medication yesterday   However, the patient has since developed chills, a symptom not typically experienced. The patient also reports feeling unusually tired and experiencing body aches. There is no change in appetite, no nausea, and no vomiting. The patient has a history of sepsis due to a UTI in 2019, which resulted in hospitalization.  Approximately six weeks prior to the current consultation, the patient underwent a bulking procedure on the bladder to address severe incontinence. Since the procedure, the patient has noticed a slower urine flow. There have been no UTIs since the sepsis episode in 2019. The patient's heart occasionally skips beats, a condition already under the care of a cardiologist.         Patient Active Problem List   Diagnosis Date Noted   Post-nasal drip 04/27/2023   Low TSH level 04/27/2023   Atrial ectopy  04/27/2023   History of TIA (transient ischemic attack) 04/27/2023   Gastroesophageal reflux disease 04/27/2023   Polyp of ascending colon    Angiodysplasia of intestinal tract    Hypertrophy of anal papillae    Diverticulosis of large intestine without diverticulitis    Essential hypertension, benign 05/24/2018   CLE (columnar lined esophagus)    Heartburn    Osteopenia 12/12/2017   Insomnia 04/10/2016   Hypertensive retinopathy 02/15/2016   Urine incontinence 02/15/2016   Mixed hyperlipidemia 10/28/2015   Hormone replacement therapy (postmenopausal) 10/28/2015   Postzoster neuralgia 05/15/2015    Past Surgical History:  Procedure Laterality Date    MESH SLING X 15 YRS     ANTERIOR AND POSTERIOR REPAIR     AUGMENTATION MAMMAPLASTY Bilateral    20 yrs ago , then replaced 5 yrs ago   BLADDER SUSPENSION     BREAST ENHANCEMENT SURGERY     BREAST SURGERY  2012   BILATERAL BREAST IMPLANT REMOVAL   COLONOSCOPY WITH PROPOFOL N/A 11/28/2018   Procedure: COLONOSCOPY WITH PROPOFOL;  Surgeon: Pasty Spillers, MD;  Location: [facility];  Service: Endoscopy;  Laterality: N/A;   ESOPHAGOGASTRODUODENOSCOPY (EGD) WITH PROPOFOL N/A 05/04/2018   Procedure: ESOPHAGOGASTRODUODENOSCOPY (EGD) WITH PROPOFOL;  Surgeon: Pasty Spillers, MD;  Location: [facility] END[facility]COPY;  Service: Endoscopy;  Laterality: N/A;   EYE SURGERY  20114   right eye   INCONTINENCE SURGERY     NASAL SINUS SURGERY     NOSE SURGERY     VAGINAL HYSTERECTOMY  still has ovaries    Family History  Problem Relation Age of Onset   Cancer Mother        COLON AND UTERINE   Cancer Father        PROSTATE   Diabetes Father    Hypertension Father    Stroke Father    Arthritis Sister    Diabetes Son    Breast cancer Neg Hx     Social History   Tobacco Use   Smoking status: Never   Smokeless tobacco: Never   Tobacco comments:    smoking cessation materials not required  Substance Use Topics   Alcohol use:  No     Current Outpatient Medications:    azithromycin (ZITHROMAX) 250 MG tablet, Take 2 tablets on day 1, then 1 tablet daily on days 2 through 5, Disp: 6 tablet, Rfl: 0   Calcium-Magnesium-Zinc 334-134-5 MG TABS, Take 1 tablet by mouth daily., Disp: , Rfl:    celecoxib (CELEBREX) 200 MG capsule, Take 200 mg by mouth daily. , Disp: , Rfl:    Cranberry 500 MG CAPS, Take 1 capsule by mouth 2 (two) times daily., Disp: , Rfl:    D-Mannose POWD, Take 1 each by mouth daily. 1 teaspoon daily, Disp: , Rfl:    Est Estrogens-Methyltest DS 1.25-2.5 MG TABS, Take 1 tablet by mouth daily., Disp: 90 tablet, Rfl: 1   famotidine (PEPCID) 20 MG tablet, Take 1 tablet (20 mg total) by mouth daily., Disp: 90 tablet, Rfl: 0   fluticasone (FLONASE) 50 MCG/ACT nasal spray, Place 2 sprays into both nostrils 2 (two) times daily., Disp: 16 g, Rfl: 0   gabapentin (NEURONTIN) 100 MG capsule, Take 200 mg at night for 2 weeks, then 100 mg at night for 2 weeks, then 100 mg every other night for 2 weeks, Disp: 49 capsule, Rfl: 0   Omega 3-6-9 Fatty Acids (TRIPLE OMEGA-3-6-9) CAPS, Take 1 capsule by mouth daily. 1200 mg Omega daily, Disp: , Rfl:    Probiotic Product (PROBIOTIC PEARLS PO), Take 1 each by mouth daily. Softgel for women's vaginal and urinary tract health, Disp: , Rfl:   Allergies  Allergen Reactions   Tizanidine Other (See Comments)    Unable to sleep.    I personally reviewed active problem list, medication list, allergies, family history with the patient/caregiver today.   ROS  Ten systems reviewed and is negative except as mentioned in HPI    Objective  Vitals:   05/02/23 1354  BP: 104/64  Pulse: 80  Resp: 16  Temp: 98.9 F (37.2 C)  TempSrc: Oral  SpO2: 93%  Weight: 143 lb 8 oz (65.1 kg)  Height: 5\' 4"  (1.626 m)    Body mass index is 24.63 kg/m.  Physical Exam  Constitutional: Patient appears well-developed and well-nourished.  No distress.  HEENT: head atraumatic, normocephalic,  pupils equal and reactive to light, neck supple Cardiovascular: Normal rate, regular rhythm and normal heart sounds.  No murmur heard. No BLE edema. Pulmonary/Chest: Effort normal and breath sounds normal. No respiratory distress. Abdominal: Soft.  There is no tenderness. Negative CVA Psychiatric: Patient has a normal mood and affect. behavior is normal. Judgment and thought content normal.   Recent Results (from the past 2160 hours)  Thyroid Panel With TSH     Status: None   Collection Time: 04/27/23  1:50 PM  Result Value Ref Range   T3 Uptake 32 22 - 35 %   T4, Total 5.5 5.1 - 11.9 mcg/dL  Free Thyroxine Index 1.8 1.4 - 3.8   TSH 0.57 0.40 - 4.50 mIU/L  TRAb (TSH Receptor Binding Antibody)     Status: None   Collection Time: 04/27/23  1:50 PM  Result Value Ref Range   TRAB <1.00 <=2.00 IU/L    Comment: . This test was performed using the TRAb Antibody ELISA method which is standardized against the 1st International Standard 90/672 and is reported in International Units (IU/L). The reference range reported was established specifically for this test method. .     PHQ2/9:    05/02/2023    1:52 PM 04/27/2023   12:49 PM 01/01/2019    4:05 PM 09/17/2018   10:22 AM 05/24/2018   11:13 AM  Depression screen PHQ 2/9  Decreased Interest 0 0 0 0 0  Down, Depressed, Hopeless 0 0 0 0 0  PHQ - 2 Score 0 0 0 0 0  Altered sleeping 0 0 0 0 0  Tired, decreased energy 0 0 0 0 0  Change in appetite 0 0 0 0 0  Feeling bad or failure about yourself  0 0 0 0 0  Trouble concentrating 0 0 0 0 0  Moving slowly or fidgety/restless 0 0 0 0 0  Suicidal thoughts 0 0 0 0 0  PHQ-9 Score 0 0 0 0 0  Difficult doing work/chores Not difficult at all Not difficult at all Not difficult at all Not difficult at all Not difficult at all    phq 9 is negative   Fall Risk:    04/27/2023   12:49 PM 01/01/2019    4:04 PM 09/17/2018   10:22 AM 08/30/2018    1:21 PM 05/24/2018   11:13 AM  Fall Risk   Falls in  the past year? 1 0 1 0 0  Number falls in past yr: 0 0 0  0  Injury with Fall? 1 0 0  0     Assessment & Plan  Assessment and Plan    Upper Respiratory Infection Persistent cough with yellow-green phlegm, recently started on Azithromycin (Z-Pak). New onset of chills and general malaise, despite starting antibiotics. -Continue Azithromycin as prescribed. -Order chest x-ray to rule out pneumonia. -Order CBC and comprehensive panel to assess overall health and potential infection. -Rocephin dose today of 1 gm  Possible Urinary Tract Infection History of sepsis secondary to UTI in 2019. Recent bladder bulking procedure for incontinence. Current symptoms of low urine output and potential UTI. -Collect urine sample for culture. -Administer Rocephin 1g IM today to cover potential UTI and community-acquired pneumonia. -Order urine culture to confirm UTI.  Follow-up in one week to assess response to treatment. Contact patient sooner if condition worsens.

## 2023-05-04 ENCOUNTER — Other Ambulatory Visit: Payer: Self-pay | Admitting: Family Medicine

## 2023-05-04 ENCOUNTER — Telehealth: Payer: Self-pay | Admitting: Internal Medicine

## 2023-05-04 DIAGNOSIS — D72829 Elevated white blood cell count, unspecified: Secondary | ICD-10-CM

## 2023-05-04 LAB — COMPLETE METABOLIC PANEL WITH GFR
AG Ratio: 1.6 (calc) (ref 1.0–2.5)
ALT: 13 U/L (ref 6–29)
AST: 20 U/L (ref 10–35)
Albumin: 3.8 g/dL (ref 3.6–5.1)
Alkaline phosphatase (APISO): 55 U/L (ref 37–153)
BUN/Creatinine Ratio: 28 (calc) — ABNORMAL HIGH (ref 6–22)
BUN: 27 mg/dL — ABNORMAL HIGH (ref 7–25)
CO2: 29 mmol/L (ref 20–32)
Calcium: 9 mg/dL (ref 8.6–10.4)
Chloride: 99 mmol/L (ref 98–110)
Creat: 0.96 mg/dL (ref 0.60–1.00)
Globulin: 2.4 g/dL (ref 1.9–3.7)
Glucose, Bld: 106 mg/dL — ABNORMAL HIGH (ref 65–99)
Potassium: 3.7 mmol/L (ref 3.5–5.3)
Sodium: 136 mmol/L (ref 135–146)
Total Bilirubin: 0.5 mg/dL (ref 0.2–1.2)
Total Protein: 6.2 g/dL (ref 6.1–8.1)
eGFR: 61 mL/min/{1.73_m2} (ref >=60)

## 2023-05-04 LAB — CBC WITH DIFFERENTIAL/PLATELET
Absolute Lymphocytes: 1346 {cells}/uL (ref 850–3900)
Absolute Monocytes: 1170 {cells}/uL — ABNORMAL HIGH (ref 200–950)
Basophils Absolute: 35 {cells}/uL (ref 0–200)
Basophils Relative: 0.3 %
Eosinophils Absolute: 47 {cells}/uL (ref 15–500)
Eosinophils Relative: 0.4 %
HCT: 40.9 % (ref 35.0–45.0)
Hemoglobin: 14.2 g/dL (ref 11.7–15.5)
MCH: 32.5 pg (ref 27.0–33.0)
MCHC: 34.7 g/dL (ref 32.0–36.0)
MCV: 93.6 fL (ref 80.0–100.0)
MPV: 11.3 fL (ref 7.5–12.5)
Monocytes Relative: 10 %
Neutro Abs: 9103 {cells}/uL — ABNORMAL HIGH (ref 1500–7800)
Neutrophils Relative %: 77.8 %
Platelets: 164 10*3/uL (ref 140–400)
RBC: 4.37 10*6/uL (ref 3.80–5.10)
RDW: 11.9 % (ref 11.0–15.0)
Total Lymphocyte: 11.5 %
WBC: 11.7 10*3/uL — ABNORMAL HIGH (ref 3.8–10.8)

## 2023-05-04 MED ORDER — CIPROFLOXACIN HCL 250 MG PO TABS: 250.0000 mg | ORAL_TABLET | Freq: Two times a day (BID) | ORAL | 0 refills | Status: AC

## 2023-05-04 NOTE — Telephone Encounter (Signed)
Copied from CRM [id number]. Topic: General - Other >> May 04, 2023  9:17 AM Franchot Heidelberg wrote: Reason for CRM: Pt called to discuss lab results

## 2023-05-04 NOTE — Telephone Encounter (Signed)
Please review labs. 

## 2023-05-05 LAB — CBC WITH DIFFERENTIAL/PLATELET
Absolute Lymphocytes: 1090 {cells}/uL (ref 850–3900)
Absolute Monocytes: 696 {cells}/uL (ref 200–950)
Basophils Absolute: 53 {cells}/uL (ref 0–200)
Basophils Relative: 1.1 %
Eosinophils Absolute: 120 {cells}/uL (ref 15–500)
Eosinophils Relative: 2.5 %
HCT: 39.9 % (ref 35.0–45.0)
Hemoglobin: 13.2 g/dL (ref 11.7–15.5)
MCH: 31.4 pg (ref 27.0–33.0)
MCHC: 33.1 g/dL (ref 32.0–36.0)
MCV: 94.8 fL (ref 80.0–100.0)
MPV: 11.4 fL (ref 7.5–12.5)
Monocytes Relative: 14.5 %
Neutro Abs: 2842 {cells}/uL (ref 1500–7800)
Neutrophils Relative %: 59.2 %
Platelets: 182 10*3/uL (ref 140–400)
RBC: 4.21 10*6/uL (ref 3.80–5.10)
RDW: 11.8 % (ref 11.0–15.0)
Total Lymphocyte: 22.7 %
WBC: 4.8 10*3/uL (ref 3.8–10.8)

## 2023-05-05 LAB — CULTURE, URINE COMPREHENSIVE
MICRO NUMBER:: 15865747
SPECIMEN QUALITY:: ADEQUATE

## 2023-05-06 ENCOUNTER — Encounter: Payer: Self-pay | Admitting: Family Medicine

## 2023-05-08 ENCOUNTER — Other Ambulatory Visit: Payer: Self-pay | Admitting: Family Medicine

## 2023-05-08 ENCOUNTER — Encounter: Payer: Self-pay | Admitting: Internal Medicine

## 2023-05-08 DIAGNOSIS — B379 Candidiasis, unspecified: Secondary | ICD-10-CM

## 2023-05-08 MED ORDER — FLUCONAZOLE 150 MG PO TABS: 150.0000 mg | ORAL_TABLET | ORAL | 0 refills | Status: DC

## 2023-05-11 ENCOUNTER — Other Ambulatory Visit (HOSPITAL_COMMUNITY)
Admission: RE | Admit: 2023-05-11 | Discharge: 2023-05-11 | Disposition: A | Discharge status: Home / Self Care | Payer: PPO | Source: Ambulatory Visit | Attending: Internal Medicine | Admitting: Internal Medicine

## 2023-05-11 ENCOUNTER — Ambulatory Visit (INDEPENDENT_AMBULATORY_CARE_PROVIDER_SITE_OTHER): Payer: PPO | Admitting: Internal Medicine

## 2023-05-11 ENCOUNTER — Encounter: Payer: Self-pay | Admitting: Internal Medicine

## 2023-05-11 ENCOUNTER — Other Ambulatory Visit: Payer: Self-pay

## 2023-05-11 VITALS — BP 110/70 | HR 94 | Temp 97.7°F | Resp 16 | Ht 64.0 in | Wt 144.1 lb

## 2023-05-11 DIAGNOSIS — R0982 Postnasal drip: Secondary | ICD-10-CM | POA: Diagnosis not present

## 2023-05-11 DIAGNOSIS — N898 Other specified noninflammatory disorders of vagina: Secondary | ICD-10-CM | POA: Diagnosis present

## 2023-05-11 LAB — POCT URINALYSIS DIPSTICK
Bilirubin, UA: NEGATIVE
Blood, UA: NEGATIVE
Glucose, UA: NEGATIVE
Ketones, UA: NEGATIVE
Leukocytes, UA: NEGATIVE
Nitrite, UA: NEGATIVE
Protein, UA: NEGATIVE
Spec Grav, UA: 1.02 (ref 1.010–1.025)
Urobilinogen, UA: 0.2 U/dL
pH, UA: 5 (ref 5.0–8.0)

## 2023-05-11 MED ORDER — FLUTICASONE PROPIONATE 50 MCG/ACT NA SUSP: 2.0000 | Freq: Two times a day (BID) | NASAL | 1 refills | Status: AC

## 2023-05-11 MED ORDER — FEXOFENADINE HCL 180 MG PO TABS: 180.0000 mg | ORAL_TABLET | Freq: Every day | ORAL | 1 refills | Status: DC

## 2023-05-11 NOTE — Progress Notes (Signed)
Acute Office Visit  Subjective:     Patient ID: [name], female    DOB: 03/01/1946, 77 y.o.   MRN: [id number]  Chief Complaint  Patient presents with   Follow-up    1 week recheck    HPI Patient is in today for recheck of UTI. She was here last week and was found to have an e.coli UTI. She was given an initial dose of Rocephin and then was prescribed Cipro for 3 days which she finished without any issue. Symptoms have resolved but she is having some vaginal itching, will check for yeast. Had 2 bad UTI's previously, one in 2019, 2020 both of which required hospitalization due to sepsis. This time she presented with fatigue, low grade fever and chills but these symptoms have resolved.   She's still dealing with post-nasal drip and dry cough. Was treated with Azithromycin earlier in the month but did not change symptoms. We discussed starting Flonase which has not appeared to change symptoms either.   Review of Systems  Constitutional:  Negative for chills, fever and malaise/fatigue.  HENT:  Negative for congestion, sinus pain and sore throat.   Respiratory:  Positive for cough. Negative for sputum production, shortness of breath and wheezing.   Genitourinary:  Negative for dysuria, frequency and urgency.        Objective:    BP 110/70   Pulse 94   Temp 97.7 F (36.5 C) (Oral)   Resp 16   Ht 5\' 4"  (1.626 m)   Wt 144 lb 1.6 oz (65.4 kg)   LMP  (LMP Unknown)   SpO2 97%   BMI 24.73 kg/m  BP Readings from Last 3 Encounters:  05/11/23 110/70  05/02/23 104/64  04/27/23 136/80   Wt Readings from Last 3 Encounters:  05/11/23 144 lb 1.6 oz (65.4 kg)  05/02/23 143 lb 8 oz (65.1 kg)  04/27/23 143 lb 8 oz (65.1 kg)      Physical Exam Constitutional:      Appearance: Normal appearance.  HENT:     Head: Normocephalic and atraumatic.     Nose: Nose normal.     Mouth/Throat:     Mouth: Mucous membranes are moist.     Comments: Mild PND Eyes:      Conjunctiva/sclera: Conjunctivae normal.  Cardiovascular:     Rate and Rhythm: Normal rate and regular rhythm.  Pulmonary:     Effort: Pulmonary effort is normal.     Breath sounds: Normal breath sounds. No wheezing, rhonchi or rales.  Skin:    General: Skin is warm and dry.  Neurological:     General: No focal deficit present.     Mental Status: She is alert. Mental status is at baseline.  Psychiatric:        Mood and Affect: Mood normal.        Behavior: Behavior normal.     Results for orders placed or performed in visit on 05/11/23  POCT Urinalysis Dipstick  Result Value Ref Range   Color, UA yellow    Clarity, UA clear    Glucose, UA Negative Negative   Bilirubin, UA negative    Ketones, UA negative    Spec Grav, UA 1.020 1.010 - 1.025   Blood, UA negative    pH, UA 5.0 5.0 - 8.0   Protein, UA Negative Negative   Urobilinogen, UA 0.2 0.2 or 1.0 E.U./dL   Nitrite, UA negative    Leukocytes, UA Negative Negative   Appearance  clear    Odor none         Assessment & Plan:   1. Post-nasal drip (Primary): Patient had a chest x-ray last week but no official Radiology read available. Clear to me and she's been on several rounds of antibiotics recently. Discussed how common causes of cough include post nasal drip and acid reflux. Will continue to treat PND with nasal steroids and will start an oral antihistamine. If she continues to have symptoms at follow up she will need to be evaluated by ENT.  - fexofenadine (ALLEGRA) 180 MG tablet; Take 1 tablet (180 mg total) by mouth daily.  Dispense: 90 tablet; Refill: 1 - fluticasone (FLONASE) 50 MCG/ACT nasal spray; Place 2 sprays into both nostrils 2 (two) times daily.  Dispense: 16 g; Refill: 1  2. Vaginal itching: Re-test urine, vaginal swab today as well. UTI symptoms have improved, finished antibiotic.   - POCT Urinalysis Dipstick - Cervicovaginal ancillary only   Return for already scheduled.  Margarita Mail,  DO

## 2023-05-12 LAB — CERVICOVAGINAL ANCILLARY ONLY
Bacterial Vaginitis (gardnerella): NEGATIVE
Candida Glabrata: NEGATIVE
Candida Vaginitis: POSITIVE — AB
Comment: NEGATIVE
Comment: NEGATIVE
Comment: NEGATIVE
Comment: NEGATIVE
Trichomonas: NEGATIVE

## 2023-05-23 DIAGNOSIS — S92309A Fracture of unspecified metatarsal bone(s), unspecified foot, initial encounter for closed fracture: Secondary | ICD-10-CM | POA: Insufficient documentation

## 2023-05-24 ENCOUNTER — Encounter: Payer: Self-pay | Admitting: Family Medicine

## 2023-06-06 ENCOUNTER — Ambulatory Visit
Admission: RE | Admit: 2023-06-06 | Discharge: 2023-06-06 | Disposition: A | Discharge status: Home / Self Care | Payer: PPO | Source: Ambulatory Visit | Attending: Internal Medicine | Admitting: Internal Medicine

## 2023-06-06 ENCOUNTER — Other Ambulatory Visit: Payer: Self-pay | Admitting: Internal Medicine

## 2023-06-06 DIAGNOSIS — Z8673 Personal history of transient ischemic attack (TIA), and cerebral infarction without residual deficits: Secondary | ICD-10-CM

## 2023-06-06 DIAGNOSIS — R0982 Postnasal drip: Secondary | ICD-10-CM

## 2023-06-06 DIAGNOSIS — I491 Atrial premature depolarization: Secondary | ICD-10-CM

## 2023-06-06 DIAGNOSIS — Z1231 Encounter for screening mammogram for malignant neoplasm of breast: Secondary | ICD-10-CM | POA: Insufficient documentation

## 2023-06-06 DIAGNOSIS — R7989 Other specified abnormal findings of blood chemistry: Secondary | ICD-10-CM

## 2023-06-06 DIAGNOSIS — G4700 Insomnia, unspecified: Secondary | ICD-10-CM

## 2023-06-06 DIAGNOSIS — E782 Mixed hyperlipidemia: Secondary | ICD-10-CM

## 2023-06-06 DIAGNOSIS — K219 Gastro-esophageal reflux disease without esophagitis: Secondary | ICD-10-CM

## 2023-06-06 DIAGNOSIS — R052 Subacute cough: Secondary | ICD-10-CM

## 2023-06-07 ENCOUNTER — Inpatient Hospital Stay
Admission: RE | Admit: 2023-06-07 | Discharge: 2023-06-07 | Disposition: A | Discharge status: Home / Self Care | Payer: Self-pay | Source: Ambulatory Visit | Attending: Internal Medicine | Admitting: Internal Medicine

## 2023-06-07 ENCOUNTER — Encounter: Payer: Self-pay | Admitting: Internal Medicine

## 2023-06-07 ENCOUNTER — Other Ambulatory Visit: Payer: Self-pay | Admitting: *Deleted

## 2023-06-07 DIAGNOSIS — Z1231 Encounter for screening mammogram for malignant neoplasm of breast: Secondary | ICD-10-CM

## 2023-07-26 ENCOUNTER — Encounter: Payer: Self-pay | Admitting: Internal Medicine

## 2023-07-26 ENCOUNTER — Other Ambulatory Visit: Payer: Self-pay

## 2023-07-26 ENCOUNTER — Ambulatory Visit (INDEPENDENT_AMBULATORY_CARE_PROVIDER_SITE_OTHER): Payer: PPO | Admitting: Internal Medicine

## 2023-07-26 VITALS — BP 136/84 | HR 81 | Resp 16 | Ht 64.0 in | Wt 141.1 lb

## 2023-07-26 DIAGNOSIS — G4700 Insomnia, unspecified: Secondary | ICD-10-CM

## 2023-07-26 DIAGNOSIS — I493 Ventricular premature depolarization: Secondary | ICD-10-CM | POA: Insufficient documentation

## 2023-07-26 DIAGNOSIS — R7989 Other specified abnormal findings of blood chemistry: Secondary | ICD-10-CM | POA: Diagnosis not present

## 2023-07-26 DIAGNOSIS — R062 Wheezing: Secondary | ICD-10-CM

## 2023-07-26 DIAGNOSIS — R0982 Postnasal drip: Secondary | ICD-10-CM

## 2023-07-26 MED ORDER — ALBUTEROL SULFATE HFA 108 (90 BASE) MCG/ACT IN AERS: 2.0000 | INHALATION_SPRAY | Freq: Four times a day (QID) | RESPIRATORY_TRACT | 2 refills | Status: DC | PRN

## 2023-07-26 NOTE — Assessment & Plan Note (Signed)
 Continue Flonase and Allegra as needed.

## 2023-07-26 NOTE — Assessment & Plan Note (Signed)
 Heart rate appropriate here today, cardiac exam normal.  Patient had been on metoprolol but had some side effects with this.  Discussed decreasing caffeine intake.

## 2023-07-26 NOTE — Assessment & Plan Note (Signed)
 Patient not completely off the gabapentin.  Discussed taking over-the-counter extended release melatonin to help her stay asleep at night.

## 2023-07-26 NOTE — Progress Notes (Signed)
 Established Patient Office Visit  Subjective   Patient ID: [name], female    DOB: 02-Dec-1945  Age: 78 y.o. MRN: [id number]  Chief Complaint  Patient presents with   Medical Management of Chronic Issues    3 month follow up    HPI  Patient is here for follow-up on chronic medical conditions.  Hx of Hypertension/Atrial Ectopy: -Following with Cardiology, last seen 04/24/23 -Medications: nothing currently -Had been on Amlodipine but discontinued recently after blood pressure had been normal at home for a few weeks.  Was recently tried on metoprolol for PVCs but patient states she had increased heart rate and is no longer taking this medication -Patient is compliant with above medications and reports no side effects. -Denies any SOB, CP, vision changes, LE edema or symptoms of hypotension -Will have episodes of dissociation as well as tiredness and weakness - will only last a few seconds but is currently wearing a zio monitor for evaluation -Had echo 10/24 EF >55% -CTA 10/24 negative for coronary artery disease -Recently had ZIO monitor showing 14% PVCs  HLD/Hx of TIA: -Medications: Nothing -Failed Medications: Lipitor, discontinued to myalgias  -Last lipid panel: Lipid Panel     Component Value Date/Time   CHOL 177 08/31/2018 0915   CHOL 219 (H) 10/29/2015 0805   TRIG 126 08/31/2018 0915   HDL 39 (L) 08/31/2018 0915   HDL 71 10/29/2015 0805   CHOLHDL 4.5 08/31/2018 0915   VLDL 11 12/09/2016 0823   LDLCALC 114 (H) 08/31/2018 0915   LABVLDL 11 10/29/2015 0805   -Hx of TIA in 2022   Low TSH: -TSH in 9/24 low at 0.535, had been diagnosed with subclinical thyroiditis at the time.  Thyroid panel checked 12/24 with TSH 0.45 and normal T3 and T4, thyroid receptor antibody negative -Had a Thyroid US 9/21, which showed heterogeneous thyroid and unchanged right inferior TI-RADS 4 nodule which was biopsied 08/05/2019 which showed atypia of undetermined significance.  There  is also a stable right superior TI-RADS 3 and left mid TI-RADS 2 nodule that did not require further workup.  GERD: -Currently on Famotidine 20 mg, taking PRN (about every other day) -Denies heartburn symptoms, abdominal pain, nausea or vomiting  History of urinary incontinence: -The patient just underwent a transurethral bulking injection on 03/16/2023 -She was seen postoperatively by her urogynecologist on 04/25/2023 and so far doing really well  Insomnia: -Patient had been treated with gabapentin for postherpetic neuralgia but then was using it for sleep.  At her last office visit a taper plan was written out for her which she followed with no issues.  She is no longer on the gabapentin.  Sinus drainage: -She was started on Allegra and Flonase at her last office visit, she is now taking Allegra as needed but states it does help her sinus drainage symptoms. -She does note occasional shortness of breath, especially while she is at her singing group.  She feels like she cannot take a full inhalation  Patient did mention today that she has a history of a hairline fracture on her left midfoot after stepping on her cat.  She does have an orthopedic through [facility] who recently diagnosed a second hairline fracture.  Per the patient she did have a bone scan which was negative for osteoporosis.  Health Maintenance: -Blood work UTD -Mammogram 1/25, BI-RADS 1 -Colonoscopy 11/2018 - repeat in 5 year  Patient Active Problem List   Diagnosis Date Noted   PVC's (premature ventricular contractions) 07/26/2023  Post-nasal drip 04/27/2023   Low TSH level 04/27/2023   Atrial ectopy 04/27/2023   History of TIA (transient ischemic attack) 04/27/2023   Gastroesophageal reflux disease 04/27/2023   Polyp of ascending colon    Angiodysplasia of intestinal tract    Hypertrophy of anal papillae    Diverticulosis of large intestine without diverticulitis    Essential hypertension, benign 05/24/2018   CLE  (columnar lined esophagus)    Heartburn    Osteopenia 12/12/2017   Insomnia 04/10/2016   Hypertensive retinopathy 02/15/2016   Urine incontinence 02/15/2016   Mixed hyperlipidemia 10/28/2015   Hormone replacement therapy (postmenopausal) 10/28/2015   Postzoster neuralgia 05/15/2015   Past Medical History:  Diagnosis Date   Arthritis    in back   Cataract 2011   remove from right eye   Hormone replacement therapy (postmenopausal) 10/28/2015   Mixed hyperlipidemia 10/28/2015   Osteopenia    Patient is full code 12/05/2016   TMJ (dislocation of temporomandibular joint)    Past Surgical History:  Procedure Laterality Date    MESH SLING X 15 YRS     ANTERIOR AND POSTERIOR REPAIR     AUGMENTATION MAMMAPLASTY Bilateral    20 yrs ago , then replaced 5 yrs ago   BLADDER SUSPENSION     BREAST ENHANCEMENT SURGERY     BREAST SURGERY  2012   BILATERAL BREAST IMPLANT REMOVAL   COLONOSCOPY WITH PROPOFOL N/A 11/28/2018   Procedure: COLONOSCOPY WITH PROPOFOL;  Surgeon: Pasty Spillers, MD;  Location: [facility] ENDOSCOPY;  Service: Endoscopy;  Laterality: N/A;   ESOPHAGOGASTRODUODENOSCOPY (EGD) WITH PROPOFOL N/A 05/04/2018   Procedure: ESOPHAGOGASTRODUODENOSCOPY (EGD) WITH PROPOFOL;  Surgeon: Pasty Spillers, MD;  Location: [facility] ENDOSCOPY;  Service: Endoscopy;  Laterality: N/A;   EYE SURGERY  20114   right eye   INCONTINENCE SURGERY     NASAL SINUS SURGERY     NOSE SURGERY     VAGINAL HYSTERECTOMY     still has ovaries   Social History   Tobacco Use   Smoking status: Never   Smokeless tobacco: Never   Tobacco comments:    smoking cessation materials not required  Vaping Use   Vaping status: Never Used  Substance Use Topics   Alcohol use: No   Drug use: No   Social History   Socioeconomic History   Marital status: Married    Spouse name: Casimiro Needle   Number of children: 1   Years of education: Not on file   Highest education level: Bachelor's degree (e.g., BA, AB, BS)   Occupational History   Occupation: Retired  Tobacco Use   Smoking status: Never   Smokeless tobacco: Never   Tobacco comments:    smoking cessation materials not required  Vaping Use   Vaping status: Never Used  Substance and Sexual Activity   Alcohol use: No   Drug use: No   Sexual activity: Not Currently    Partners: Male    Birth control/protection: Surgical  Other Topics Concern   Not on file  Social History Narrative   Not on file   Social Drivers of Health   Financial Resource Strain: Low Risk  (06/20/2022)   Received from [facility]   Overall Financial Resource Strain (CARDIA)    Difficulty of Paying Living Expenses: Not hard at all  Food Insecurity: No Food Insecurity (06/20/2022)   Received from [facility]   Hunger Vital Sign    Worried About Running Out of Food in the Last Year:  Never true    Ran Out of Food in the Last Year: Never true  Transportation Needs: Unmet Transportation Needs (11/03/2022)   Received from [facility] - Transportation    Lack of Transportation (Medical): Yes    Lack of Transportation (Non-Medical): Yes  Physical Activity: Insufficiently Active (03/02/2022)   Received from [facility]   Exercise Vital Sign    Days of Exercise per Week: 3 days    Minutes of Exercise per Session: 40 min  Stress: No Stress Concern Present (03/02/2022)   Received from [facility] of Occupational [facility] - Occupational Stress Questionnaire    Feeling of Stress : Not at all  Social Connections: Moderately Integrated (03/02/2022)   Received from [facility]   Social Connection and Isolation Panel [NHANES]    Frequency of Communication with Friends and Family: More than three times a week    Frequency of Social Gatherings with Friends and Family: More than three times a week    Attends Religious Services: More than 4 times per year    Active Member of [employer] or Organizations: Yes    Attends Tax inspector Meetings: More than 4 times per year    Marital Status: Widowed  Intimate Partner Violence: Not At Risk (03/02/2022)   Received from [facility]   Humiliation, Afraid, Rape, and Kick questionnaire    Fear of Current or Ex-Partner: No    Emotionally Abused: No    Physically Abused: No    Sexually Abused: No   Family Status  Relation Name Status   Mother  Deceased   Father  Deceased   Sister  Alive   Son Sherrill Raring Alive   MGM  Deceased   MGF  Deceased   PGM  Deceased   PGF  Deceased   Neg Hx  (Not Specified)  No partnership data on file   Family History  Problem Relation Age of Onset   Cancer Mother        COLON AND UTERINE   Cancer Father        PROSTATE   Diabetes Father    Hypertension Father    Stroke Father    Arthritis Sister    Diabetes Son    Breast cancer Neg Hx    Allergies  Allergen Reactions   Tizanidine Other (See Comments)    Unable to sleep.      Review of Systems  Respiratory:  Positive for shortness of breath.       Objective:     BP 136/84 (Cuff Size: Normal)   Pulse 81   Resp 16   Ht 5\' 4"  (1.626 m)   Wt 141 lb 1.6 oz (64 kg)   LMP  (LMP Unknown)   SpO2 95%   BMI 24.22 kg/m  BP Readings from Last 3 Encounters:  07/26/23 136/84  05/11/23 110/70  05/02/23 104/64   Wt Readings from Last 3 Encounters:  07/26/23 141 lb 1.6 oz (64 kg)  05/11/23 144 lb 1.6 oz (65.4 kg)  05/02/23 143 lb 8 oz (65.1 kg)      Physical Exam Constitutional:      Appearance: Normal appearance.  HENT:     Head: Normocephalic and atraumatic.     Mouth/Throat:     Mouth: Mucous membranes are moist.     Comments: Mild PND Eyes:     Extraocular Movements: Extraocular movements intact.     Conjunctiva/sclera: Conjunctivae normal.  Pupils: Pupils are equal, round, and reactive to light.  Cardiovascular:     Rate and Rhythm: Normal rate and regular rhythm.     Heart sounds: Normal heart sounds.  Pulmonary:     Effort: Pulmonary effort  is normal.     Comments: Inspiratory wheezing present Skin:    General: Skin is warm and dry.  Neurological:     General: No focal deficit present.     Mental Status: She is alert. Mental status is at baseline.  Psychiatric:        Mood and Affect: Mood normal.        Behavior: Behavior normal.      No results found for any visits on 07/26/23.  Last CBC Lab Results  Component Value Date   WBC 4.8 05/05/2023   HGB 13.2 05/05/2023   HCT 39.9 05/05/2023   MCV 94.8 05/05/2023   MCH 31.4 05/05/2023   RDW 11.8 05/05/2023   PLT 182 05/05/2023   Last metabolic panel Lab Results  Component Value Date   GLUCOSE 106 (H) 05/03/2023   NA 136 05/03/2023   K 3.7 05/03/2023   CL 99 05/03/2023   CO2 29 05/03/2023   BUN 27 (H) 05/03/2023   CREATININE 0.96 05/03/2023   EGFR 61 05/03/2023   CALCIUM 9.0 05/03/2023   PHOS 3.2 08/31/2018   PROT 6.2 05/03/2023   ALBUMIN 3.7 08/16/2018   LABGLOB 2.1 10/27/2014   AGRATIO 2.0 10/27/2014   BILITOT 0.5 05/03/2023   ALKPHOS 42 08/16/2018   AST 20 05/03/2023   ALT 13 05/03/2023   ANIONGAP 7 08/18/2018   Last lipids Lab Results  Component Value Date   CHOL 177 08/31/2018   HDL 39 (L) 08/31/2018   LDLCALC 114 (H) 08/31/2018   TRIG 126 08/31/2018   CHOLHDL 4.5 08/31/2018   Last hemoglobin A1c No results found for: "HGBA1C" Last thyroid functions Lab Results  Component Value Date   TSH 0.57 04/27/2023   T4TOTAL 5.5 04/27/2023   Last vitamin D Lab Results  Component Value Date   VD25OH 44 08/31/2018   Last vitamin B12 and Folate No results found for: "VITAMINB12", "FOLATE"    The 10-year ASCVD risk score (Arnett DK, et al., 2019) is: 21.7%    Assessment & Plan:  PVC's (premature ventricular contractions) Assessment & Plan: Heart rate appropriate here today, cardiac exam normal.  Patient had been on metoprolol but had some side effects with this.  Discussed decreasing caffeine intake.   Low TSH level Assessment &  Plan: Most recent thyroid panel in December normal normal thyroid hormones.  Thyroid receptor antibody normal.  Plan to continue monitoring yearly.   Insomnia, unspecified type Assessment & Plan: Patient not completely off the gabapentin.  Discussed taking over-the-counter extended release melatonin to help her stay asleep at night.   Post-nasal drip Assessment & Plan: Continue Flonase and Allegra as needed.   Wheezing -     Albuterol Sulfate HFA; Inhale 2 puffs into the lungs every 6 (six) hours as needed for wheezing or shortness of breath.  Dispense: 8 g; Refill: 2  Mild amount of wheezing auscultated on respiratory exam, will prescribe albuterol to use as needed for shortness of breath/wheezing.  Return in about 6 months (around 01/26/2024).    Margarita Mail, DO

## 2023-07-26 NOTE — Assessment & Plan Note (Signed)
 Most recent thyroid panel in December normal normal thyroid hormones.  Thyroid receptor antibody normal.  Plan to continue monitoring yearly.

## 2023-08-15 ENCOUNTER — Encounter: Payer: Self-pay | Admitting: Internal Medicine

## 2023-10-21 ENCOUNTER — Encounter: Payer: Self-pay | Admitting: Internal Medicine

## 2023-10-23 ENCOUNTER — Other Ambulatory Visit: Payer: Self-pay | Admitting: Internal Medicine

## 2023-10-23 DIAGNOSIS — R0982 Postnasal drip: Secondary | ICD-10-CM

## 2023-10-23 MED ORDER — FEXOFENADINE HCL 180 MG PO TABS
180.0000 mg | ORAL_TABLET | Freq: Every day | ORAL | 1 refills | Status: DC
Start: 2023-10-23 — End: 2024-01-29

## 2023-10-24 ENCOUNTER — Other Ambulatory Visit: Payer: Self-pay | Admitting: Internal Medicine

## 2023-10-24 DIAGNOSIS — Z79890 Hormone replacement therapy (postmenopausal): Secondary | ICD-10-CM

## 2023-10-24 MED ORDER — EST ESTROGENS-METHYLTEST DS 1.25-2.5 MG PO TABS: 1.0000 | ORAL_TABLET | Freq: Every day | ORAL | 0 refills | Status: DC

## 2023-10-26 ENCOUNTER — Other Ambulatory Visit: Payer: Self-pay

## 2023-10-26 DIAGNOSIS — Z79890 Hormone replacement therapy (postmenopausal): Secondary | ICD-10-CM

## 2023-10-26 MED ORDER — EST ESTROGENS-METHYLTEST DS 1.25-2.5 MG PO TABS: 1.0000 | ORAL_TABLET | Freq: Every day | ORAL | 0 refills | Status: DC

## 2023-10-27 ENCOUNTER — Other Ambulatory Visit: Payer: Self-pay | Admitting: Emergency Medicine

## 2023-10-27 DIAGNOSIS — Z79890 Hormone replacement therapy (postmenopausal): Secondary | ICD-10-CM

## 2023-10-27 MED ORDER — EST ESTROGENS-METHYLTEST DS 1.25-2.5 MG PO TABS
1.0000 | ORAL_TABLET | Freq: Every day | ORAL | 0 refills | Status: AC
Start: 2023-10-27 — End: ?

## 2023-10-30 ENCOUNTER — Other Ambulatory Visit: Payer: Self-pay | Admitting: Internal Medicine

## 2023-10-30 DIAGNOSIS — Z79890 Hormone replacement therapy (postmenopausal): Secondary | ICD-10-CM

## 2023-10-30 MED ORDER — EST ESTROGENS-METHYLTEST DS 1.25-2.5 MG PO TABS: 1.0000 | ORAL_TABLET | Freq: Every day | ORAL | 0 refills | Status: DC

## 2023-11-13 ENCOUNTER — Telehealth: Payer: Self-pay

## 2023-11-13 NOTE — Telephone Encounter (Signed)
 Pt is asking for a hard copy of this RX Est Estrogens -Methyltest DS 1.25-2.5 MG TABS . Please call pt when she can pick it up. [phone]

## 2023-11-13 NOTE — Telephone Encounter (Signed)
 Spoke to patient and she stated pharmacist never received script that was sent on 6/16. Would like a paper copy to pick up and take to pharmacy

## 2023-11-14 ENCOUNTER — Other Ambulatory Visit: Payer: Self-pay | Admitting: Internal Medicine

## 2023-11-14 ENCOUNTER — Telehealth: Payer: Self-pay | Admitting: Internal Medicine

## 2023-11-14 DIAGNOSIS — Z79890 Hormone replacement therapy (postmenopausal): Secondary | ICD-10-CM

## 2023-11-14 MED ORDER — EST ESTROGENS-METHYLTEST DS 1.25-2.5 MG PO TABS: 1.0000 | ORAL_TABLET | Freq: Every day | ORAL | 0 refills | Status: DC

## 2023-11-14 NOTE — Telephone Encounter (Unsigned)
 Copied from CRM [id number]. Topic: Clinical - Prescription Issue >> Nov 14, 2023 10:26 AM Donee H wrote: Reason for CRM: Pt is asking for a hard copy of this RX Est Estrogens -Methyltest DS 1.25-2.5 MG TABS Patient is stating really need this to be done today and that there has been several request. She will be going out of town this weekend and need medication filled before she leaves. [phone]

## 2023-11-14 NOTE — Telephone Encounter (Signed)
Patient notified for pick up.

## 2023-11-15 NOTE — Telephone Encounter (Signed)
 Requested medication (s) are due for refill today: Yes  Requested medication (s) are on the active medication list: No  Last refill:  10/30/23  Future visit scheduled: Yes  Notes to clinic:  Unable to refill per protocol, cannot delegate. Patient requesting a paper script.     Requested Prescriptions  Pending Prescriptions Disp Refills   Est Estrogens -Methyltest DS 1.25-2.5 MG TABS 90 tablet 0    Sig: Take 1 tablet by mouth daily.     Not Delegated - OB/GYN:  Hormone Combinations - Controlled Failed - 11/15/2023  9:29 PM      Failed - This refill cannot be delegated      Passed - Mammogram is up-to-date per Health Maintenance      Passed - Last BP in normal range    BP Readings from Last 1 Encounters:  07/26/23 136/84         Passed - Valid encounter within last 12 months    Recent Outpatient Visits           3 months ago PVC's (premature ventricular contractions)   [facility] Bernardo Fend, DO       Future Appointments             In 2 months Bernardo Fend, DO [facility]

## 2023-12-04 NOTE — Progress Notes (Addendum)
 Bernardo Fend, DO   Chief Complaint  Patient presents with   Establish Care    For future refills of Est Estrogens -Methyltest DS 1.25-2.5 MG TABS    HPI:      [name] is a 78 y.o. G1P1001 whose LMP was No LMP recorded (lmp unknown). Patient has had a hysterectomy., presents today for NP HRT RF. Pt is s/p vag hyst in past for FH uterine cancer in her mom. No hx of abn paps prior to hyst. No PMB. Started on prempro back in the day, then changed ultimately to estratest. Pt still takes 1.25/2.5 mg dose daily. Tried to go off it many yrs ago with VS sx. No VS sx on current dose. She is not sexually active, no vag sx.  Had episode of near syncope shortly after husband passed away with neg cardiac eval. Cardiologist still sees pt for PVCs, no change in HRT at that time. PMH in [facility] chart says TIA/stroke but pt states she wasn't told this dx.  Pt doesn't want to stop hormones.  Neg colonoscopy 2020.  Neg mammo with PCP 1/25. Doesn't do SBE.   Patient Active Problem List   Diagnosis Date Noted   PVC's (premature ventricular contractions) 07/26/2023   Post-nasal drip 04/27/2023   Low TSH level 04/27/2023   Atrial ectopy 04/27/2023   History of TIA (transient ischemic attack) 04/27/2023   Gastroesophageal reflux disease 04/27/2023   Polyp of ascending colon    Angiodysplasia of intestinal tract    Hypertrophy of anal papillae    Diverticulosis of large intestine without diverticulitis    Essential hypertension, benign 05/24/2018   CLE (columnar lined esophagus)    Heartburn    Osteopenia 12/12/2017   Insomnia 04/10/2016   Hypertensive retinopathy 02/15/2016   Urine incontinence 02/15/2016   Mixed hyperlipidemia 10/28/2015   Hormone replacement therapy (postmenopausal) 10/28/2015   Postzoster neuralgia 05/15/2015    Past Surgical History:  Procedure Laterality Date    MESH SLING X 15 YRS     ANTERIOR AND POSTERIOR REPAIR     AUGMENTATION MAMMAPLASTY Bilateral     20 yrs ago , then replaced 5 yrs ago   BLADDER SUSPENSION     BREAST ENHANCEMENT SURGERY     BREAST SURGERY  2012   BILATERAL BREAST IMPLANT REMOVAL   COLONOSCOPY WITH PROPOFOL  N/A 11/28/2018   Procedure: COLONOSCOPY WITH PROPOFOL ;  Surgeon: Janalyn Keene NOVAK, MD;  Location: [facility] END[facility];  Service: Endoscopy;  Laterality: N/A;   ESOPHAGOGASTRODUODENOSCOPY (EGD) WITH PROPOFOL  N/A 05/04/2018   Procedure: ESOPHAGOGASTRODUODENOSCOPY (EGD) WITH PROPOFOL ;  Surgeon: Janalyn Keene NOVAK, MD;  Location: [facility] ENDOSCOPY;  Service: Endoscopy;  Laterality: N/A;   EYE SURGERY  20114   right eye   INCONTINENCE SURGERY     NASAL SINUS SURGERY     NOSE SURGERY     VAGINAL HYSTERECTOMY     still has ovaries    Family History  Problem Relation Age of Onset   Cancer Mother 51       COLON AND UTERINE   Cancer Father        PROSTATE   Diabetes Father    Hypertension Father    Stroke Father    Arthritis Sister    Diabetes Son    Breast cancer Neg Hx     Social History   Socioeconomic History   Marital status: Married    Spouse name: Ozell   Number of children: 1   Years of education: Not  on file   Highest education level: Bachelor's degree (e.g., BA, AB, BS)  Occupational History   Occupation: Retired  Tobacco Use   Smoking status: Never   Smokeless tobacco: Never   Tobacco comments:    smoking cessation materials not required  Vaping Use   Vaping status: Never Used  Substance and Sexual Activity   Alcohol use: No   Drug use: No   Sexual activity: Not Currently    Partners: Male    Birth control/protection: Surgical    Comment: Hysterectomy  Other Topics Concern   Not on file  Social History Narrative   Not on file   Social Drivers of Health   Financial Resource Strain: Low Risk  (06/20/2022)   Received from [facility]   Overall Financial Resource Strain (CARDIA)    Difficulty of Paying Living Expenses: Not hard at all  Food Insecurity: No Food Insecurity  (06/20/2022)   Received from [facility]   Hunger Vital Sign    Within the past 12 months, you worried that your food would run out before you got the money to buy more.: Never true    Within the past 12 months, the food you bought just didn't last and you didn't have money to get more.: Never true  Transportation Needs: Unmet Transportation Needs (11/03/2022)   Received from [facility]   PRAPARE - Transportation    Lack of Transportation (Medical): Yes    Lack of Transportation (Non-Medical): Yes  Physical Activity: Insufficiently Active (03/02/2022)   Received from [facility]   Exercise Vital Sign    On average, how many days per week do you engage in moderate to strenuous exercise (like a brisk walk)?: 3 days    On average, how many minutes do you engage in exercise at this level?: 40 min  Stress: No Stress Concern Present (03/02/2022)   Received from [facility] - Occupational Stress Questionnaire    Feeling of Stress : Not at all  Social Connections: Moderately Integrated (03/02/2022)   Received from [facility]   Social Connection and Isolation Panel    In a typical week, how many times do you talk on the phone with family, friends, or neighbors?: More than three times a week    How often do you get together with friends or relatives?: More than three times a week    How often do you attend church or religious services?: More than 4 times per year    Do you belong to any clubs or organizations such as church groups, unions, fraternal or athletic groups, or school groups?: Yes    How often do you attend meetings of the clubs or organizations you belong to?: More than 4 times per year    Are you married, widowed, divorced, separated, never married, or living with a partner?: Widowed  Intimate Partner Violence: Not At Risk (03/02/2022)   Received from [facility]   Humiliation, Afraid, Rape, and Kick questionnaire     Within the last year, have you been afraid of your partner or ex-partner?: No    Within the last year, have you been humiliated or emotionally abused in other ways by your partner or ex-partner?: No    Within the last year, have you been kicked, hit, slapped, or otherwise physically hurt by your partner or ex-partner?: No    Within the last year, have you been raped or forced to  have any kind of sexual activity by your partner or ex-partner?: No    Outpatient Medications Prior to Visit  Medication Sig Dispense Refill   albuterol  (VENTOLIN  HFA) 108 (90 Base) MCG/ACT inhaler Inhale 2 puffs into the lungs every 6 (six) hours as needed for wheezing or shortness of breath. 8 g 2   Calcium -Magnesium -Zinc 334-134-5 MG TABS Take 1 tablet by mouth daily.     celecoxib (CELEBREX) 200 MG capsule Take 200 mg by mouth daily.      Cranberry 500 MG CAPS Take 1 capsule by mouth 2 (two) times daily.     D-Mannose POWD Take 1 each by mouth daily. 1 teaspoon daily     famotidine  (PEPCID ) 20 MG tablet Take 1 tablet (20 mg total) by mouth daily. 90 tablet 0   fexofenadine  (ALLEGRA ) 180 MG tablet Take 1 tablet (180 mg total) by mouth daily. 90 tablet 1   fluticasone  (FLONASE ) 50 MCG/ACT nasal spray Place 2 sprays into both nostrils 2 (two) times daily. 16 g 1   Omega 3-6-9 Fatty Acids (TRIPLE OMEGA-3-6-9) CAPS Take 1 capsule by mouth daily. 1200 mg Omega daily     Probiotic Product (PROBIOTIC PEARLS PO) Take 1 each by mouth daily. Softgel for women's vaginal and urinary tract health     Est Estrogens -Methyltest DS 1.25-2.5 MG TABS Take 1 tablet by mouth daily. 90 tablet 0   No facility-administered medications prior to visit.      ROS:  Review of Systems  Constitutional:  Negative for fever.  Gastrointestinal:  Negative for blood in stool, constipation, diarrhea, nausea and vomiting.  Genitourinary:  Negative for dyspareunia, dysuria, flank pain, frequency, hematuria, urgency, vaginal bleeding, vaginal  discharge and vaginal pain.  Musculoskeletal:  Negative for back pain.  Skin:  Negative for rash.   BREAST: No symptoms   OBJECTIVE:   Vitals:  BP 130/75   Pulse 68   Ht 5' 4 (1.626 m)   Wt 141 lb (64 kg)   LMP  (LMP Unknown)   BMI 24.20 kg/m  Repeat 130/75  Physical Exam Vitals reviewed.  Constitutional:      Appearance: She is well-developed.  Pulmonary:     Effort: Pulmonary effort is normal.  Musculoskeletal:        General: Normal range of motion.     Cervical back: Normal range of motion.  Skin:    General: Skin is warm and dry.  Neurological:     General: No focal deficit present.     Mental Status: She is alert and oriented to person, place, and time.     Cranial Nerves: No cranial nerve deficit.  Psychiatric:        Mood and Affect: Mood normal.        Behavior: Behavior normal.        Thought Content: Thought content normal.        Judgment: Judgment normal.     Assessment/Plan: Hormone replacement therapy (HRT) - Plan: estrogen-methylTESTOSTERone 0.625-1.25 MG tablet; discussed risks>benefits of HRT at her age including breast cancer/DVTs/stroke/MI. Pt doesn't want to stop HRT. Pt will call cardiologist to clarify her dx and if she can still take ERT. If gets cardio approval, already plan to lower pt's dose and then hopefully she can decrease even further to 1/2 tab of estradiol  0.625 mg.    Meds ordered this encounter  Medications   estrogen-methylTESTOSTERone 0.625-1.25 MG tablet    Sig: Take 1 tablet by mouth daily.    Dispense:  90 tablet    Refill:  0    Supervising Provider:   LEIGH SOBER [[id number]]      Return in about 1 year (around 12/04/2024).  [name] B. Traniyah Hallett, PA-C 12/05/2023 3:09 PM  ADDENDUM: 12/12/23. Spoke with pt. She messaged cardiologist but hasn't heard back yet. Will f/u with me once she does.   ADDENDUM: 12/14/23 Pt sent me message with cardiologist recommendations. Basically he said his notes did not mention Tia or stroke  and that you may have been looking at some other note, and that from a cardiology standpoint, he has no reason for me not to take the supplements.  Pt to try 0.0625 mg estratest dose, Rx eRxd for 6 months. F/u prn.

## 2023-12-05 ENCOUNTER — Ambulatory Visit: Admitting: Obstetrics and Gynecology

## 2023-12-05 ENCOUNTER — Encounter: Payer: Self-pay | Admitting: Obstetrics and Gynecology

## 2023-12-05 VITALS — BP 130/75 | HR 68 | Ht 64.0 in | Wt 141.0 lb

## 2023-12-05 DIAGNOSIS — Z79890 Hormone replacement therapy (postmenopausal): Secondary | ICD-10-CM | POA: Diagnosis not present

## 2023-12-05 MED ORDER — EST ESTROGENS-METHYLTEST 0.625-1.25 MG PO TABS: 1.0000 | ORAL_TABLET | Freq: Every day | ORAL | 1 refills | Status: DC

## 2023-12-05 NOTE — Patient Instructions (Signed)
 I value your feedback and you entrusting [place] with your care. If you get a [place] patient survey, I would appreciate you taking the time to let us know about your experience today. Thank you! ? ? ?

## 2023-12-12 ENCOUNTER — Encounter: Payer: Self-pay | Admitting: Obstetrics and Gynecology

## 2023-12-14 ENCOUNTER — Other Ambulatory Visit: Payer: Self-pay | Admitting: Obstetrics and Gynecology

## 2023-12-14 DIAGNOSIS — Z79890 Hormone replacement therapy (postmenopausal): Secondary | ICD-10-CM

## 2023-12-14 MED ORDER — EST ESTROGENS-METHYLTEST 0.625-1.25 MG PO TABS: 1.0000 | ORAL_TABLET | Freq: Every day | ORAL | 1 refills | Status: AC

## 2023-12-14 NOTE — Progress Notes (Signed)
 Rx RF estratest since has cardio approval.

## 2024-01-09 DIAGNOSIS — M2040 Other hammer toe(s) (acquired), unspecified foot: Secondary | ICD-10-CM | POA: Insufficient documentation

## 2024-01-09 DIAGNOSIS — M205X9 Other deformities of toe(s) (acquired), unspecified foot: Secondary | ICD-10-CM | POA: Insufficient documentation

## 2024-01-29 ENCOUNTER — Ambulatory Visit: Admitting: Internal Medicine

## 2024-01-29 ENCOUNTER — Other Ambulatory Visit: Payer: Self-pay

## 2024-01-29 ENCOUNTER — Encounter: Payer: Self-pay | Admitting: Internal Medicine

## 2024-01-29 VITALS — BP 110/72 | HR 86 | Temp 98.1°F | Resp 16 | Ht 64.0 in | Wt 136.5 lb

## 2024-01-29 DIAGNOSIS — R7989 Other specified abnormal findings of blood chemistry: Secondary | ICD-10-CM

## 2024-01-29 DIAGNOSIS — Z79818 Long term (current) use of other agents affecting estrogen receptors and estrogen levels: Secondary | ICD-10-CM

## 2024-01-29 DIAGNOSIS — M170 Bilateral primary osteoarthritis of knee: Secondary | ICD-10-CM | POA: Diagnosis not present

## 2024-01-29 DIAGNOSIS — Z13820 Encounter for screening for osteoporosis: Secondary | ICD-10-CM

## 2024-01-29 DIAGNOSIS — Z13220 Encounter for screening for lipoid disorders: Secondary | ICD-10-CM

## 2024-01-29 DIAGNOSIS — Z1231 Encounter for screening mammogram for malignant neoplasm of breast: Secondary | ICD-10-CM

## 2024-01-29 DIAGNOSIS — R0982 Postnasal drip: Secondary | ICD-10-CM

## 2024-01-29 DIAGNOSIS — E559 Vitamin D deficiency, unspecified: Secondary | ICD-10-CM | POA: Diagnosis not present

## 2024-01-29 DIAGNOSIS — Z23 Encounter for immunization: Secondary | ICD-10-CM | POA: Diagnosis not present

## 2024-01-29 DIAGNOSIS — Z8673 Personal history of transient ischemic attack (TIA), and cerebral infarction without residual deficits: Secondary | ICD-10-CM

## 2024-01-29 DIAGNOSIS — Z1211 Encounter for screening for malignant neoplasm of colon: Secondary | ICD-10-CM

## 2024-01-29 DIAGNOSIS — M79645 Pain in left finger(s): Secondary | ICD-10-CM

## 2024-01-29 DIAGNOSIS — E2839 Other primary ovarian failure: Secondary | ICD-10-CM

## 2024-01-29 MED ORDER — CELECOXIB 200 MG PO CAPS: 200.0000 mg | ORAL_CAPSULE | Freq: Every day | ORAL | 1 refills | Status: AC | PRN

## 2024-01-29 NOTE — Patient Instructions (Addendum)
 It was great seeing you today!  Plan discussed at today's visit: -Recommend using nasal saline - [place], [place] and if symptoms continue recommend Flonase  (nasal steroid)  -Referral placed to GI to discuss colon cancer screening -Refills of Celebrex  placed -Prevnar 20 administered today -Mammogram and bone scan ordered, please call to schedule it after 06/07/24  Follow up in: 6 months  Take care and let us  know if you have any questions or concerns prior to your next visit.  Dr. Bernardo

## 2024-01-29 NOTE — Progress Notes (Signed)
 Established Patient Office Visit  Subjective   Patient ID: [name], female    DOB: 01/18/1946  Age: 78 y.o. MRN: [id number]  Chief Complaint  Patient presents with   Medical Management of Chronic Issues    6 month recheck    HPI  Patient is here for follow-up on chronic medical conditions.  Discussed the use of AI scribe software for clinical note transcription with the patient, who gave verbal consent to proceed.  History of Present Illness [name] is a 78 year old female who presents for a follow-up on cancer screenings and sinus drainage.  She has a history of colon polyps and a family history of colon cancer. Her last colonoscopy was in 2020, with removal of several polyps, including two tubular adenomas. She has not had a colonoscopy in the past year.  She experiences sinus drainage and has tried Allegra  without significant relief. She is considering other options like warm beverages and has not used Flonase  recently.  She takes estrogen intermittently and uses Celebrex  as needed for knee pain during exercise. She reports hand pain, likely due to tendinitis from phone use, and applies topical treatments for relief.  She has osteopenia and is considering a bone scan with her upcoming mammogram. She received a pneumonia vaccine in 2017 and is considering an updated version. She exercises regularly at the [facility], participates in chair yoga, and has lost weight recently through diet and exercise.   Hx of Hypertension/Atrial Ectopy: -Following with Cardiology -Medications: nothing currently -Had been on Amlodipine but discontinued recently after blood pressure had been normal at home for a few weeks.  Was recently tried on metoprolol  for PVCs but patient states she had increased heart rate and is no longer taking this medication -Patient is compliant with above medications and reports no side effects. -Denies any SOB, CP, vision changes, LE edema or symptoms of  hypotension -Will have episodes of dissociation as well as tiredness and weakness - will only last a few seconds but is currently wearing a zio monitor for evaluation -Had echo 10/24 EF >55% -CTA 10/24 negative for coronary artery disease -Recently had ZIO monitor showing 14% PVCs  HLD/Hx of TIA: -Medications: Nothing -Failed Medications: Lipitor, discontinued to myalgias  -Last lipid panel: Lipid Panel     Component Value Date/Time   CHOL 177 08/31/2018 0915   CHOL 219 (H) 10/29/2015 0805   TRIG 126 08/31/2018 0915   HDL 39 (L) 08/31/2018 0915   HDL 71 10/29/2015 0805   CHOLHDL 4.5 08/31/2018 0915   VLDL 11 12/09/2016 0823   LDLCALC 114 (H) 08/31/2018 0915   LABVLDL 11 10/29/2015 0805   -Hx of TIA in 2022   Low TSH: -TSH in 9/24 low at 0.535, had been diagnosed with subclinical thyroiditis at the time.  Thyroid  panel checked 12/24 with TSH 0.45 and normal T3 and T4, thyroid  receptor antibody negative -Had a Thyroid  US  9/21, which showed heterogeneous thyroid  and unchanged right inferior TI-RADS 4 nodule which was biopsied 08/05/2019 which showed atypia of undetermined significance.  There is also a stable right superior TI-RADS 3 and left mid TI-RADS 2 nodule that did not require further workup.  GERD: -Currently on Famotidine  20 mg, taking PRN (about every other day) -Denies heartburn symptoms, abdominal pain, nausea or vomiting  History of urinary incontinence: -The patient underwent a transurethral bulking injection on 03/16/2023  Sinus drainage: -She was started on Allegra  and Flonase  at her last office visit, now states she  stopped both because they were not working. -She does note occasional shortness of breath, especially while she is at her singing group.  She feels like she cannot take a full inhalation  Health Maintenance: -Blood work UTD -Mammogram 1/25, BI-RADS 1 -Colonoscopy 11/2018 - repeat in 5 years -Prevnar 20 due  Patient Active Problem List   Diagnosis  Date Noted   PVC's (premature ventricular contractions) 07/26/2023   Post-nasal drip 04/27/2023   Low TSH level 04/27/2023   Atrial ectopy 04/27/2023   History of TIA (transient ischemic attack) 04/27/2023   Gastroesophageal reflux disease 04/27/2023   Polyp of ascending colon    Angiodysplasia of intestinal tract    Hypertrophy of anal papillae    Diverticulosis of large intestine without diverticulitis    Essential hypertension, benign 05/24/2018   CLE (columnar lined esophagus)    Heartburn    Osteopenia 12/12/2017   Insomnia 04/10/2016   Hypertensive retinopathy 02/15/2016   Urine incontinence 02/15/2016   Mixed hyperlipidemia 10/28/2015   Hormone replacement therapy (postmenopausal) 10/28/2015   Postzoster neuralgia 05/15/2015   Past Medical History:  Diagnosis Date   Arthritis    in back   Cataract 2011   remove from right eye   Hormone replacement therapy (postmenopausal) 10/28/2015   Mixed hyperlipidemia 10/28/2015   Osteopenia    Patient is full code 12/05/2016   TMJ (dislocation of temporomandibular joint)    Past Surgical History:  Procedure Laterality Date    MESH SLING X 15 YRS     ANTERIOR AND POSTERIOR REPAIR     AUGMENTATION MAMMAPLASTY Bilateral    20 yrs ago , then replaced 5 yrs ago   BLADDER SUSPENSION     BREAST ENHANCEMENT SURGERY     BREAST SURGERY  2012   BILATERAL BREAST IMPLANT REMOVAL   COLONOSCOPY WITH PROPOFOL  N/A 11/28/2018   Procedure: COLONOSCOPY WITH PROPOFOL ;  Surgeon: Janalyn Keene NOVAK, MD;  Location: [facility] ENDOSCOPY;  Service: Endoscopy;  Laterality: N/A;   ESOPHAGOGASTRODUODENOSCOPY (EGD) WITH PROPOFOL  N/A 05/04/2018   Procedure: ESOPHAGOGASTRODUODENOSCOPY (EGD) WITH PROPOFOL ;  Surgeon: Janalyn Keene NOVAK, MD;  Location: [facility] ENDOSCOPY;  Service: Endoscopy;  Laterality: N/A;   EYE SURGERY  20114   right eye   INCONTINENCE SURGERY     NASAL SINUS SURGERY     NOSE SURGERY     VAGINAL HYSTERECTOMY     still has ovaries    Social History   Tobacco Use   Smoking status: Never   Smokeless tobacco: Never   Tobacco comments:    smoking cessation materials not required  Vaping Use   Vaping status: Never Used  Substance Use Topics   Alcohol use: No   Drug use: No   Social History   Socioeconomic History   Marital status: Married    Spouse name: Ozell   Number of children: 1   Years of education: Not on file   Highest education level: Bachelor's degree (e.g., BA, AB, BS)  Occupational History   Occupation: Retired  Tobacco Use   Smoking status: Never   Smokeless tobacco: Never   Tobacco comments:    smoking cessation materials not required  Vaping Use   Vaping status: Never Used  Substance and Sexual Activity   Alcohol use: No   Drug use: No   Sexual activity: Not Currently    Partners: Male    Birth control/protection: Surgical    Comment: Hysterectomy  Other Topics Concern   Not on file  Social History Narrative  Not on file   Social Drivers of Health   Financial Resource Strain: Low Risk  (06/20/2022)   Received from [facility]   Overall Financial Resource Strain (CARDIA)    Difficulty of Paying Living Expenses: Not hard at all  Food Insecurity: No Food Insecurity (06/20/2022)   Received from [facility]   Hunger Vital Sign    Within the past 12 months, you worried that your food would run out before you got the money to buy more.: Never true    Within the past 12 months, the food you bought just didn't last and you didn't have money to get more.: Never true  Transportation Needs: Unmet Transportation Needs (11/03/2022)   Received from [facility] - Transportation    Lack of Transportation (Medical): Yes    Lack of Transportation (Non-Medical): Yes  Physical Activity: Insufficiently Active (03/02/2022)   Received from [facility]   Exercise Vital Sign    On average, how many days per week do you engage in moderate to strenuous exercise (like a brisk  walk)?: 3 days    On average, how many minutes do you engage in exercise at this level?: 40 min  Stress: No Stress Concern Present (03/02/2022)   Received from [facility] - Occupational Stress Questionnaire    Feeling of Stress : Not at all  Social Connections: Moderately Integrated (03/02/2022)   Received from [facility]   Social Connection and Isolation Panel    In a typical week, how many times do you talk on the phone with family, friends, or neighbors?: More than three times a week    How often do you get together with friends or relatives?: More than three times a week    How often do you attend church or religious services?: More than 4 times per year    Do you belong to any clubs or organizations such as church groups, unions, fraternal or athletic groups, or school groups?: Yes    How often do you attend meetings of the clubs or organizations you belong to?: More than 4 times per year    Are you married, widowed, divorced, separated, never married, or living with a partner?: Widowed  Intimate Partner Violence: Not At Risk (03/02/2022)   Received from [facility]   Humiliation, Afraid, Rape, and Kick questionnaire    Within the last year, have you been afraid of your partner or ex-partner?: No    Within the last year, have you been humiliated or emotionally abused in other ways by your partner or ex-partner?: No    Within the last year, have you been kicked, hit, slapped, or otherwise physically hurt by your partner or ex-partner?: No    Within the last year, have you been raped or forced to have any kind of sexual activity by your partner or ex-partner?: No   Family Status  Relation Name Status   Mother  Deceased   Father  Deceased   Sister  Alive   MGM  Deceased   MGF  Deceased   PGM  Deceased   PGF  Deceased   Son Eligha Alive   Neg Hx  (Not Specified)  No partnership data on file   Family History  Problem Relation  Age of Onset   Cancer Mother 28       COLON AND UTERINE   Cancer Father  PROSTATE   Diabetes Father    Hypertension Father    Stroke Father    Arthritis Sister    Diabetes Son    Breast cancer Neg Hx    Allergies  Allergen Reactions   Tizanidine  Other (See Comments)    Unable to sleep.  tizanidine    Amoxicillin -Pot Clavulanate Other (See Comments)    Severe yeast infection  amoxicillin  / clavulanate      Review of Systems  All other systems reviewed and are negative.     Objective:     BP 110/72 (Cuff Size: Normal)   Pulse 86   Temp 98.1 F (36.7 C) (Oral)   Resp 16   Ht 5' 4 (1.626 m)   Wt 136 lb 8 oz (61.9 kg)   LMP  (LMP Unknown)   SpO2 97%   BMI 23.43 kg/m  BP Readings from Last 3 Encounters:  01/29/24 110/72  12/05/23 130/75  07/26/23 136/84   Wt Readings from Last 3 Encounters:  01/29/24 136 lb 8 oz (61.9 kg)  12/05/23 141 lb (64 kg)  07/26/23 141 lb 1.6 oz (64 kg)      Physical Exam Constitutional:      Appearance: Normal appearance.  HENT:     Head: Normocephalic and atraumatic.     Mouth/Throat:     Mouth: Mucous membranes are moist.     Comments: Mild PND Eyes:     Conjunctiva/sclera: Conjunctivae normal.  Cardiovascular:     Rate and Rhythm: Normal rate and regular rhythm.  Pulmonary:     Effort: Pulmonary effort is normal.     Breath sounds: Normal breath sounds.  Skin:    General: Skin is warm and dry.  Neurological:     General: No focal deficit present.     Mental Status: She is alert. Mental status is at baseline.  Psychiatric:        Mood and Affect: Mood normal.        Behavior: Behavior normal.      No results found for any visits on 01/29/24.  Last CBC Lab Results  Component Value Date   WBC 4.8 05/05/2023   HGB 13.2 05/05/2023   HCT 39.9 05/05/2023   MCV 94.8 05/05/2023   MCH 31.4 05/05/2023   RDW 11.8 05/05/2023   PLT 182 05/05/2023   Last metabolic panel Lab Results  Component Value Date    GLUCOSE 106 (H) 05/03/2023   NA 136 05/03/2023   K 3.7 05/03/2023   CL 99 05/03/2023   CO2 29 05/03/2023   BUN 27 (H) 05/03/2023   CREATININE 0.96 05/03/2023   EGFR 61 05/03/2023   CALCIUM  9.0 05/03/2023   PHOS 3.2 08/31/2018   PROT 6.2 05/03/2023   ALBUMIN 3.7 08/16/2018   LABGLOB 2.1 10/27/2014   AGRATIO 2.0 10/27/2014   BILITOT 0.5 05/03/2023   ALKPHOS 42 08/16/2018   AST 20 05/03/2023   ALT 13 05/03/2023   ANIONGAP 7 08/18/2018   Last lipids Lab Results  Component Value Date   CHOL 177 08/31/2018   HDL 39 (L) 08/31/2018   LDLCALC 114 (H) 08/31/2018   TRIG 126 08/31/2018   CHOLHDL 4.5 08/31/2018   Last hemoglobin A1c No results found for: HGBA1C Last thyroid  functions Lab Results  Component Value Date   TSH 0.57 04/27/2023   T4TOTAL 5.5 04/27/2023   Last vitamin D  Lab Results  Component Value Date   VD25OH 44 08/31/2018   Last vitamin B12 and Folate No results found  for: VITAMINB12, FOLATE    The 10-year ASCVD risk score (Arnett DK, et al., 2019) is: 14.9%    Assessment & Plan:   Assessment & Plan Postnasal drip Persistent postnasal drip with ineffective Allegra . Differential includes postnasal drip versus other causes of phlegm sensation. - Recommend nasal saline spray to rinse sinuses. - If symptoms persist, use Flonase  nasal spray.  Hand and thumb pain, likely de Quervain's tenosynovitis Pain in thumb and wrist likely due to de Quervain's tenosynovitis from overuse. Loss of hand strength noted. - Advise avoiding triggers and resting the thumb. - Use topical medications like Icy Hot for pain relief. - Consider using a wrist brace to limit thumb movement.  Arthritis of Knees  Knee pain exacerbated by exercise. Managed with Advil. Advised against concurrent use of Celebrex  and Advil. - Continue using Advil as needed for knee pain. - Avoid taking Celebrex  and Advil together.  History of colon polyps (tubular adenoma) Tubular adenoma polyps  removed in 2020. Family history of colon cancer. Discussed risks and benefits of continued colon cancer screening and considered Cologuard as a non-invasive option. - Refer to GI specialist for further recommendations on colonoscopy. - Consider Cologuard test as an alternative to colonoscopy.  Osteopenia Osteopenia with last bone density scan in 2019. Discussed importance of monitoring bone health. - Order bone density scan in January with mammogram.  Menopausal symptoms on estrogen therapy Continues on estrogen therapy for menopausal symptoms. Cardiologist confirmed no contraindications. - Continue estrogen therapy as currently prescribed.   - CBC w/Diff/Platelet - Comprehensive Metabolic Panel (CMET) - celecoxib  (CELEBREX ) 200 MG capsule; Take 1 capsule (200 mg total) by mouth daily as needed for moderate pain (pain score 4-6).  Dispense: 90 capsule; Refill: 1 - TSH - Lipid Profile - Vitamin D  (25 hydroxy) - DG Bone Density; Future - Ambulatory referral to Gastroenterology - MM 3D SCREENING MAMMOGRAM BILATERAL BREAST; Future - Pneumococcal conjugate vaccine 20-valent (Prevnar 20)  Return in about 6 months (around 07/28/2024).    Sharyle Fischer, DO

## 2024-01-30 LAB — CBC WITH DIFFERENTIAL/PLATELET
Absolute Lymphocytes: 1156 {cells}/uL (ref 850–3900)
Absolute Monocytes: 451 {cells}/uL (ref 200–950)
Basophils Absolute: 61 {cells}/uL (ref 0–200)
Basophils Relative: 1.3 %
Eosinophils Absolute: 193 {cells}/uL (ref 15–500)
Eosinophils Relative: 4.1 %
HCT: 43.9 % (ref 35.0–45.0)
Hemoglobin: 14.5 g/dL (ref 11.7–15.5)
MCH: 31.5 pg (ref 27.0–33.0)
MCHC: 33 g/dL (ref 32.0–36.0)
MCV: 95.4 fL (ref 80.0–100.0)
MPV: 10.9 fL (ref 7.5–12.5)
Monocytes Relative: 9.6 %
Neutro Abs: 2839 {cells}/uL (ref 1500–7800)
Neutrophils Relative %: 60.4 %
Platelets: 180 Thousand/uL (ref 140–400)
RBC: 4.6 Million/uL (ref 3.80–5.10)
RDW: 11.8 % (ref 11.0–15.0)
Total Lymphocyte: 24.6 %
WBC: 4.7 Thousand/uL (ref 3.8–10.8)

## 2024-01-30 LAB — COMPREHENSIVE METABOLIC PANEL WITH GFR
AG Ratio: 1.9 (calc) (ref 1.0–2.5)
ALT: 13 U/L (ref 6–29)
AST: 21 U/L (ref 10–35)
Albumin: 4.1 g/dL (ref 3.6–5.1)
Alkaline phosphatase (APISO): 53 U/L (ref 37–153)
BUN: 25 mg/dL (ref 7–25)
CO2: 31 mmol/L (ref 20–32)
Calcium: 9.4 mg/dL (ref 8.6–10.4)
Chloride: 105 mmol/L (ref 98–110)
Creat: 0.68 mg/dL (ref 0.60–1.00)
Globulin: 2.2 g/dL (ref 1.9–3.7)
Glucose, Bld: 81 mg/dL (ref 65–99)
Potassium: 4.9 mmol/L (ref 3.5–5.3)
Sodium: 141 mmol/L (ref 135–146)
Total Bilirubin: 0.5 mg/dL (ref 0.2–1.2)
Total Protein: 6.3 g/dL (ref 6.1–8.1)
eGFR: 90 mL/min/1.73m2 (ref >=60)

## 2024-01-30 LAB — LIPID PANEL
Cholesterol: 207 mg/dL — ABNORMAL HIGH (ref <200)
HDL: 64 mg/dL (ref >=50)
LDL Cholesterol (Calc): 127 mg/dL — ABNORMAL HIGH
Non-HDL Cholesterol (Calc): 143 mg/dL — ABNORMAL HIGH (ref <130)
Total CHOL/HDL Ratio: 3.2 (calc) (ref <5.0)
Triglycerides: 67 mg/dL (ref <150)

## 2024-01-30 LAB — TSH: TSH: 0.42 m[IU]/L (ref 0.40–4.50)

## 2024-01-30 LAB — VITAMIN D 25 HYDROXY (VIT D DEFICIENCY, FRACTURES): Vit D, 25-Hydroxy: 61 ng/mL (ref 30–100)

## 2024-01-31 ENCOUNTER — Ambulatory Visit: Payer: Self-pay | Admitting: Internal Medicine

## 2024-01-31 ENCOUNTER — Telehealth: Payer: Self-pay

## 2024-01-31 NOTE — Telephone Encounter (Signed)
 Patient will drop specimen off on tomorrow

## 2024-01-31 NOTE — Telephone Encounter (Signed)
 Copied from CRM [id number]. Topic: Clinical - Lab/Test Results >> Jan 31, 2024 10:00 AM Anairis L wrote: Reason for CRM: Patient is requesting a Urine sample(Lab) to be created. She thinks she has a UTI and will be out of town next week.Thank you

## 2024-02-05 ENCOUNTER — Other Ambulatory Visit: Payer: Self-pay

## 2024-02-05 DIAGNOSIS — R300 Dysuria: Secondary | ICD-10-CM

## 2024-02-05 DIAGNOSIS — R3915 Urgency of urination: Secondary | ICD-10-CM

## 2024-02-06 ENCOUNTER — Encounter: Payer: Self-pay | Admitting: Internal Medicine

## 2024-02-06 LAB — URINE CULTURE
MICRO NUMBER:: 17000478
SPECIMEN QUALITY:: ADEQUATE

## 2024-02-07 ENCOUNTER — Ambulatory Visit: Payer: Self-pay | Admitting: Internal Medicine

## 2024-02-07 NOTE — Telephone Encounter (Signed)
 Patient stated she had symptoms on Saturday and she think it was irritation from urethra> Better now, no symptoms

## 2024-03-12 ENCOUNTER — Ambulatory Visit: Admitting: Family Medicine

## 2024-03-19 ENCOUNTER — Ambulatory Visit: Admitting: Family Medicine

## 2024-03-19 ENCOUNTER — Encounter: Payer: Self-pay | Admitting: Family Medicine

## 2024-03-19 VITALS — BP 144/89 | HR 78 | Temp 98.0°F | Ht 64.0 in | Wt 137.4 lb

## 2024-03-19 DIAGNOSIS — K219 Gastro-esophageal reflux disease without esophagitis: Secondary | ICD-10-CM

## 2024-03-19 DIAGNOSIS — Z860100 Personal history of colon polyps, unspecified: Secondary | ICD-10-CM | POA: Diagnosis not present

## 2024-03-19 DIAGNOSIS — Z800 Family history of malignant neoplasm of digestive organs: Secondary | ICD-10-CM

## 2024-03-19 MED ORDER — NA SULFATE-K SULFATE-MG SULF 17.5-3.13-1.6 GM/177ML PO SOLN: 1.0000 | Freq: Once | ORAL | 0 refills | Status: AC

## 2024-03-19 NOTE — Progress Notes (Signed)
 03/19/2024 [name] [id number] [birth year]  Gastroenterology Office Note    Referring Provider: Bernardo Fend, DO Primary Care Physician:  Bernardo Fend, DO  Primary GI Provider: Jinny Carmine, MD    Chief Complaint   Chief Complaint  Patient presents with   Colon Polyps    Repeat 5 yr colonoscopy... Pt denies any concerns or symptoms     History of Present Illness   [name] is a 78 y.o. female with PMHX of colon polyps presenting today at the request of Bernardo Fend, DO to schedule surveillance colonoscopy.   Patient's last colonoscopy in 2020 with with tubular adenomas.  Patient denies any concerns or symptoms.  States she typically has a bowel movement daily that is soft and formed.  She does take fiber daily and eats a lot of fruits and vegetables. Denies melena, hematochezia, abdominal pain, or unintentional weight loss.   Patient has history of H. pylori in 2019.  States she has been taking famotidine  daily or every other day.  She states that she may have some mild heartburn at night maybe once a month if she eats later in the day. Denies dysphagia, vomiting, or nausea.  11/28/2018 Colonoscopy - Two 3 to 4 mm polyps in the ascending colon and in the cecum, removed with a cold biopsy forceps. Resected and retrieved. TUBULAR ADENOMA (1).  - POLYPOID BENIGN COLONIC MUCOSA (1).  - NEGATIVE FOR HIGH-GRADE DYSPLASIA AND MALIGNANCY  - Two non-bleeding colonic angioectasias.  - Diverticulosis in the sigmoid colon.  - The examination was otherwise normal.  - The rectum, sigmoid colon, descending colon, transverse colon, ascending colon and cecum are normal. - Anal papilla(e) were hypertrophied. - The distal rectum and anal verge are normal on retroflexion view. - Repeat colonoscopy in 5 years for screening purposes.  Family history of colon cancer, mother died from colon cancer.    05-17-2018 upper endoscopy - Salmon-colored mucosa  suspicious for short-segment Barrett's esophagus. Biopsied.  SQUAMOCOLUMNAR MUCOSA WITH MILD CHRONIC ACTIVE INFLAMMATION AND MULTILAYERED EPITHELIUM, CONSISTENT WITH REFLUX ESOPHAGITIS.  - NEGATIVE FOR GOBLET CELLS, DYSPLASIA, AND MALIGNANCY.  - Normal stomach. Biopsied. - HELICOBACTER PYLORI-ASSOCIATED GASTRITIS WITH MILD CHRONIC ACTIVE  INFLAMMATION, INVOLVING ANTRAL AND OXYNTIC MUCOSA.  - H. PYLORI BACTERIA ARE IDENTIFIED IN ROUTINE SECTIONS.  - NEGATIVE FOR ATROPHY, INTESTINAL METAPLASIA, DYSPLASIA, AND  MALIGNANCY.  - Normal duodenal bulb, second portion of the duodenum and examined duodenum. - Biopsies were obtained in the gastric body, at the incisura and in the gastric antrum.  Past Medical History:  Diagnosis Date   Acute renal insufficiency 02/02/2018   Anxiety 08/05/2020   Arthralgia 08/05/2020   Arthralgia of both knees 03/01/2021   Arthralgia of both knees 03/01/2021   Arthritis    in back   Cataract 2011   remove from right eye   Closed fracture of medial cuneiform bone of foot 09/27/2022   Closed fracture of metatarsal bone 05/23/2023   Closed fracture of second metatarsal bone of left foot 09/22/2022   Contusion of left foot 09/27/2022   Depression 11/05/2020   Edema of lower extremity 09/27/2022   Fatigue 01/08/2021   Hormone replacement therapy (postmenopausal) 10/28/2015   Hypokalemia 02/01/2018   Leukocytosis 01/31/2018   Localized edema 10/25/2022   Luetscher's syndrome 02/01/2018   Metatarsalgia of left foot 04/25/2023   Mixed hyperlipidemia 10/28/2015   Multiple thyroid  nodules 10/08/2019   Osteoarthritis 04/19/2021   Osteopenia    Overactive bladder 06/24/2019   Pain in left  foot 04/11/2023   Patient is full code 12/05/2016   Prerenal renal failure 02/01/2018   Pulmonary nodules 04/12/2019   Se[facility] on CT in 08/2018 at [facility]. Consider repeat CT in 08/2019 if high risk.      IMPRESSION:  1. No evidence of pulmonary embolism. No acute intrathoracic   process.  2. Two 4-5 mm pulmonary nodules in the right middle and lower lobes.  No follow-up needed if patient is low-risk (and has no known or  suspected primary neoplasm). Non-contrast chest CT can be considered  in 12 months if patient i   Sepsis due to Escherichia coli (HCC) 02/02/2018   Sprain of ligament of tarsometatarsal joint 09/27/2022   SUI (stress urinary incontinence, female) 01/23/2023   TMJ (dislocation of temporomandibular joint)    Urinary tract infection 01/31/2018    Past Surgical History:  Procedure Laterality Date    MESH SLING X 15 YRS     ANTERIOR AND POSTERIOR REPAIR     AUGMENTATION MAMMAPLASTY Bilateral    20 yrs ago , then replaced 5 yrs ago   BLADDER SUSPENSION     BREAST ENHANCEMENT SURGERY     BREAST SURGERY  2012   BILATERAL BREAST IMPLANT REMOVAL   COLONOSCOPY WITH PROPOFOL  N/A 11/28/2018   Procedure: COLONOSCOPY WITH PROPOFOL ;  Surgeon: Janalyn Keene NOVAK, MD;  Location: [facility] ENDOSCOPY;  Service: Endoscopy;  Laterality: N/A;   ESOPHAGOGASTRODUODENOSCOPY (EGD) WITH PROPOFOL  N/A 05/04/2018   Procedure: ESOPHAGOGASTRODUODENOSCOPY (EGD) WITH PROPOFOL ;  Surgeon: Janalyn Keene NOVAK, MD;  Location: [facility] ENDOSCOPY;  Service: Endoscopy;  Laterality: N/A;   EYE SURGERY  20114   right eye   INCONTINENCE SURGERY     NASAL SINUS SURGERY     NOSE SURGERY     VAGINAL HYSTERECTOMY     still has ovaries    Current Outpatient Medications  Medication Sig Dispense Refill   Calcium -Magnesium -Zinc 334-134-5 MG TABS Take 1 tablet by mouth daily.     celecoxib  (CELEBREX ) 200 MG capsule Take 1 capsule (200 mg total) by mouth daily as needed for moderate pain (pain score 4-6). 90 capsule 1   estrogen-methylTESTOSTERone 0.625-1.25 MG tablet Take 1 tablet by mouth daily. 90 tablet 1   famotidine  (PEPCID ) 20 MG tablet Take 1 tablet (20 mg total) by mouth daily. 90 tablet 0   fluticasone  (FLONASE ) 50 MCG/ACT nasal spray Place 2 sprays into both nostrils 2 (two) times daily.  16 g 1   MYRBETRIQ  50 MG TB24 tablet Take 50 mg by mouth daily.     Na Sulfate-K Sulfate-Mg Sulfate concentrate (SUPREP) 17.5-3.13-1.6 GM/177ML SOLN Take 1 kit (354 mLs total) by mouth once for 1 dose. 354 mL 0   Omega 3-6-9 Fatty Acids (TRIPLE OMEGA-3-6-9) CAPS Take 1 capsule by mouth daily. 1200 mg Omega daily     Probiotic Product (PROBIOTIC PEARLS PO) Take 1 each by mouth daily. Softgel for women's vaginal and urinary tract health     albuterol  (VENTOLIN  HFA) 108 (90 Base) MCG/ACT inhaler Inhale 2 puffs into the lungs every 6 (six) hours as needed for wheezing or shortness of breath. 8 g 2   No current facility-administered medications for this visit.    Allergies as of 03/19/2024 - Review Complete 03/19/2024  Allergen Reaction Noted   Tizanidine  Other (See Comments) 12/12/2017   Amoxicillin -pot clavulanate Other (See Comments) 03/14/2022    Family History  Problem Relation Age of Onset   Cancer Mother 31       COLON AND UTERINE  Cancer Father        PROSTATE   Diabetes Father    Hypertension Father    Stroke Father    Arthritis Sister    Diabetes Son    Breast cancer Neg Hx     Social History   Socioeconomic History   Marital status: Married    Spouse name: Ozell   Number of children: 1   Years of education: Not on file   Highest education level: Bachelor's degree (e.g., BA, AB, BS)  Occupational History   Occupation: Retired  Tobacco Use   Smoking status: Never   Smokeless tobacco: Never   Tobacco comments:    smoking cessation materials not required  Vaping Use   Vaping status: Never Used  Substance and Sexual Activity   Alcohol use: No   Drug use: No   Sexual activity: Not Currently    Partners: Male    Birth control/protection: Surgical    Comment: Hysterectomy  Other Topics Concern   Not on file  Social History Narrative   Not on file   Social Drivers of Health   Financial Resource Strain: Low Risk  (06/20/2022)   Received from [facility]    Overall Financial Resource Strain (CARDIA)    Difficulty of Paying Living Expenses: Not hard at all  Food Insecurity: No Food Insecurity (06/20/2022)   Received from [facility]   Hunger Vital Sign    Within the past 12 months, you worried that your food would run out before you got the money to buy more.: Never true    Within the past 12 months, the food you bought just didn't last and you didn't have money to get more.: Never true  Transportation Needs: Unmet Transportation Needs (11/03/2022)   Received from [facility]   PRAPARE - Transportation    Lack of Transportation (Medical): Yes    Lack of Transportation (Non-Medical): Yes  Physical Activity: Insufficiently Active (03/02/2022)   Received from [facility]   Exercise Vital Sign    On average, how many days per week do you engage in moderate to strenuous exercise (like a brisk walk)?: 3 days    On average, how many minutes do you engage in exercise at this level?: 40 min  Stress: No Stress Concern Present (03/02/2022)   Received from [facility] - Occupational Stress Questionnaire    Feeling of Stress : Not at all  Social Connections: Moderately Integrated (03/02/2022)   Received from [facility]   Social Connection and Isolation Panel    In a typical week, how many times do you talk on the phone with family, friends, or neighbors?: More than three times a week    How often do you get together with friends or relatives?: More than three times a week    How often do you attend church or religious services?: More than 4 times per year    Do you belong to any clubs or organizations such as church groups, unions, fraternal or athletic groups, or school groups?: Yes    How often do you attend meetings of the clubs or organizations you belong to?: More than 4 times per year    Are you married, widowed, divorced, separated, never married, or living with a partner?: Widowed   Intimate Partner Violence: Not At Risk (03/02/2022)   Received from [facility]   Humiliation, Afraid, Rape, and Kick questionnaire  Within the last year, have you been afraid of your partner or ex-partner?: No    Within the last year, have you been humiliated or emotionally abused in other ways by your partner or ex-partner?: No    Within the last year, have you been kicked, hit, slapped, or otherwise physically hurt by your partner or ex-partner?: No    Within the last year, have you been raped or forced to have any kind of sexual activity by your partner or ex-partner?: No     RELEVANT GI HISTORY, IMAGING AND LABS: CBC    Component Value Date/Time   WBC 4.7 01/29/2024 1005   RBC 4.60 01/29/2024 1005   HGB 14.5 01/29/2024 1005   HGB 14.2 10/29/2015 0805   HCT 43.9 01/29/2024 1005   HCT 41.6 10/29/2015 0805   PLT 180 01/29/2024 1005   PLT 186 10/29/2015 0805   MCV 95.4 01/29/2024 1005   MCV 93 10/29/2015 0805   MCH 31.5 01/29/2024 1005   MCHC 33.0 01/29/2024 1005   RDW 11.8 01/29/2024 1005   RDW 13.0 10/29/2015 0805   LYMPHSABS 1,227 09/18/2018 0947   LYMPHSABS 1.3 10/29/2015 0805   MONOABS 0.9 08/16/2018 1439   EOSABS 193 01/29/2024 1005   EOSABS 0.2 10/29/2015 0805   BASOSABS 61 01/29/2024 1005   BASOSABS 0.0 10/29/2015 0805   Recent Labs    05/03/23 1016 05/05/23 0901 01/29/24 1005  HGB 14.2 13.2 14.5    CMP     Component Value Date/Time   NA 141 01/29/2024 1005   NA 141 10/29/2015 0805   K 4.9 01/29/2024 1005   CL 105 01/29/2024 1005   CO2 31 01/29/2024 1005   GLUCOSE 81 01/29/2024 1005   BUN 25 01/29/2024 1005   BUN 19 10/29/2015 0805   CREATININE 0.68 01/29/2024 1005   CALCIUM  9.4 01/29/2024 1005   PROT 6.3 01/29/2024 1005   PROT 6.2 10/27/2014 0940   ALBUMIN 3.7 08/16/2018 1439   ALBUMIN 4.1 10/27/2014 0940   AST 21 01/29/2024 1005   ALT 13 01/29/2024 1005   ALKPHOS 42 08/16/2018 1439   BILITOT 0.5 01/29/2024 1005   BILITOT 0.3  10/27/2014 0940   GFRNONAA 67 09/18/2018 0947   GFRAA 78 09/18/2018 0947      Latest Ref Rng & Units 01/29/2024   10:05 AM 05/03/2023   10:16 AM 08/31/2018    9:15 AM  Hepatic Function  Total Protein 6.1 - 8.1 g/dL 6.3  6.2  6.3   AST 10 - 35 U/L 21  20  17    ALT 6 - 29 U/L 13  13  15    Total Bilirubin 0.2 - 1.2 mg/dL 0.5  0.5  0.4       Review of Systems   All systems reviewed and negative except where noted in HPI.    Physical Exam  BP (!) 144/89 (BP Location: Left Arm, Patient Position: Sitting, Cuff Size: Normal)   Pulse 78   Temp 98 F (36.7 C) (Oral)   Ht 5' 4 (1.626 m)   Wt 137 lb 6.4 oz (62.3 kg)   LMP  (LMP Unknown)   BMI 23.58 kg/m  No LMP recorded (lmp unknown). Patient has had a hysterectomy. General:   Alert and oriented. Pleasant and cooperative. Well-nourished and well-developed.  Head:  Normocephalic and atraumatic. Eyes:  Without icterus Ears:  Normal auditory acuity. Lungs:  Respirations even and unlabored.  Clear throughout to auscultation.   No wheezes, crackles, or rhonchi. No acute  distress. Heart:  Regular rate and rhythm; no murmurs, clicks, rubs, or gallops. Abdomen:  Normal bowel sounds.  No bruits.  Soft, non-tender and non-distended without masses, hepatosplenomegaly or hernias noted.  No guarding or rebound tenderness.   Rectal:  Deferred. Msk:  Symmetrical without gross deformities. Normal posture. Extremities:  Without edema. Neurologic:  Alert and  oriented x4;  grossly normal neurologically. Skin:  Intact without significant lesions or rashes. Psych:  Alert and cooperative. Normal mood and affect.   Assessment & Plan   [name] is a 78 y.o. female presenting today to schedule 5-year surveillance colonoscopy.  Last colonoscopy in 2020 with two 3 to 4 mm polyps in the ascending colon and in the cecum.  Patient's mother died of colon cancer.  Denies any alarm symptoms.  GERD is well-controlled and she will continue taking  famotidine . Proceed with colonoscopy in near future: the risks, benefits, and alternatives have been discussed with the patient in detail, which include, but are not limited to: bleeding, infection, perforation & drug reaction. The patient states understanding and desires to proceed.    Patient will follow-up as needed.  Grayce Bohr, DNP, AGNP-C [facility]

## 2024-03-26 ENCOUNTER — Encounter: Payer: Self-pay | Admitting: Internal Medicine

## 2024-03-27 ENCOUNTER — Telehealth: Payer: Self-pay

## 2024-03-27 NOTE — Telephone Encounter (Signed)
 Pt requested to reschedule colonoscopy.  Colonoscopy has been rescheduled to 06/25/24 from Dr. Melany to Dr. Jinny  Endo notified.  Thanks, [place], CMA

## 2024-04-15 ENCOUNTER — Other Ambulatory Visit: Payer: Self-pay | Admitting: Internal Medicine

## 2024-04-15 DIAGNOSIS — A09 Infectious gastroenteritis and colitis, unspecified: Secondary | ICD-10-CM

## 2024-04-15 MED ORDER — AZITHROMYCIN 500 MG PO TABS: 500.0000 mg | ORAL_TABLET | Freq: Every day | ORAL | 0 refills | Status: AC

## 2024-04-15 NOTE — Telephone Encounter (Signed)
 Patient stated will you be able to give her the Malaria medication also  [place]

## 2024-05-07 ENCOUNTER — Encounter: Payer: Self-pay | Admitting: Family Medicine

## 2024-05-07 ENCOUNTER — Ambulatory Visit: Admitting: Family Medicine

## 2024-05-07 VITALS — BP 136/84 | HR 89 | Temp 97.9°F | Resp 18 | Ht 64.0 in | Wt 137.5 lb

## 2024-05-07 DIAGNOSIS — R300 Dysuria: Secondary | ICD-10-CM

## 2024-05-07 LAB — POCT URINALYSIS DIPSTICK
Bilirubin, UA: NEGATIVE
Glucose, UA: NEGATIVE
Ketones, UA: NEGATIVE
Nitrite, UA: POSITIVE
Protein, UA: POSITIVE — AB
Spec Grav, UA: 1.02
Urobilinogen, UA: 0.2 U/dL
pH, UA: 5

## 2024-05-07 MED ORDER — CIPROFLOXACIN HCL 250 MG PO TABS: 250.0000 mg | ORAL_TABLET | Freq: Two times a day (BID) | ORAL | 0 refills | Status: AC

## 2024-05-07 NOTE — Progress Notes (Signed)
 Name: [name]   MRN: [id number]    DOB: 26-Oct-1945   Date:05/07/2024       Progress Note  Subjective  Chief Complaint  Chief Complaint  Patient presents with   Dysuria   Urinary Tract Infection    Discussed the use of AI scribe software for clinical note transcription with the patient, who gave verbal consent to proceed.  History of Present Illness [name] is a 78 year old female who presents with symptoms of a possible blood infection.  She began experiencing symptoms on Sunday, characterized by a significant sense of urgency. Increasing her water intake provided temporary relief by bedtime, but symptoms recurred on Tuesday around midday with burning and persistent urgency, necessitating frequent trips to the bathroom.  She has a history of overactive bladder and was previously on Mirabegron  until two weeks ago when she switched to trospium due to changes in Medicare coverage. She takes trospium twice daily and suspects it might be contributing to her current symptoms.  In 2019 and 2020, she experienced her first and only UTIs, which led to sepsis, presenting with fever and chills. Currently, no fever, chills, or back pain. Her appetite remains good.  She is concerned about her upcoming mission trip on January 15 and the possibility of recurrent infections. She has a history of a bulk of med procedure last year to address urgency issues, which she feels may have worsened her condition.    Patient Active Problem List   Diagnosis Date Noted   Overriding toes 01/09/2024   Hammer toe 01/09/2024   PVC's (premature ventricular contractions) 07/26/2023   Post-nasal drip 04/27/2023   Low TSH level 04/27/2023   Atrial ectopy 04/27/2023   History of TIA (transient ischemic attack) 04/27/2023   Gastroesophageal reflux disease 04/27/2023   Polyp of ascending colon    Angiodysplasia of intestinal tract    Hypertrophy of anal papillae    Diverticulosis of large intestine  without diverticulitis    Essential hypertension, benign 05/24/2018   CLE (columnar lined esophagus)    Heartburn    Osteopenia 12/12/2017   Insomnia 04/10/2016   Hypertensive retinopathy 02/15/2016   Urine incontinence 02/15/2016   Mixed hyperlipidemia 10/28/2015   Hormone replacement therapy (postmenopausal) 10/28/2015   Postzoster neuralgia 05/15/2015    Social History   Tobacco Use   Smoking status: Never   Smokeless tobacco: Never   Tobacco comments:    smoking cessation materials not required  Substance Use Topics   Alcohol use: No    Current Medications[1]  Allergies[2]  ROS  Ten systems reviewed and is negative except as mentioned in HPI    Objective  Vitals:   05/07/24 1429  BP: 136/84  Pulse: 89  Resp: 18  Temp: 97.9 F (36.6 C)  TempSrc: Oral  SpO2: 97%  Weight: 137 lb 8 oz (62.4 kg)  Height: 5' 4 (1.626 m)    Body mass index is 23.6 kg/m.   Physical Exam VITALS: P- 89, BP- 136/84 CONSTITUTIONAL: Patient appears well-developed and well-nourished. No distress. HEENT: Head atraumatic, normocephalic, neck supple. CARDIOVASCULAR: Normal rate, regular rhythm and normal heart sounds. No murmur heard. No BLE edema. PULMONARY: Effort normal and breath sounds normal. No respiratory distress. ABDOMINAL: There is no tenderness or distention. Kidneys normal. MUSCULOSKELETAL: Normal gait. Without gross motor or sensory deficit. PSYCHIATRIC: Patient has a normal mood and affect. Behavior is normal. Judgment and thought content normal.  Recent Results (from the past 2160 hours)  POCT Urinalysis Dipstick  Status: Abnormal   Collection Time: 05/07/24  2:37 PM  Result Value Ref Range   Color, UA Yellow    Clarity, UA Cloudy    Glucose, UA Negative Negative   Bilirubin, UA Negative    Ketones, UA Negative    Spec Grav, UA 1.020 1.010 - 1.025   Blood, UA Large    pH, UA 5.0 5.0 - 8.0   Protein, UA Positive (A) Negative   Urobilinogen, UA 0.2 0.2 or  1.0 E.U./dL   Nitrite, UA Positive    Leukocytes, UA Large (3+) (A) Negative   Appearance yellow    Odor foul     Assessment & Plan Acute cystitis Urinalysis confirmed UTI. No systemic symptoms suggest early stage. History of UTIs with sepsis raises concern for recurrence. - Prescribed ciprofloxacin  250 mg twice daily for 3 days. - Sent urine for culture to confirm bacterial growth and antibiotic sensitivity. - Advised to monitor for signs of sepsis such as fever, chills, confusion, or inability to eat. - Recommended follow-up with urologist if symptoms persist or if urine culture indicates need for different treatment.  Overactive bladder Managed with trospium after switch from Mirabegron . Symptoms may relate to medication change or concurrent UTI. - Monitor symptoms and consider urologist consultation if symptoms persist or worsen.            [1]  Current Outpatient Medications:    albuterol  (VENTOLIN  HFA) 108 (90 Base) MCG/ACT inhaler, Inhale 2 puffs into the lungs every 6 (six) hours as needed for wheezing or shortness of breath., Disp: 8 g, Rfl: 2   Calcium -Magnesium -Zinc 334-134-5 MG TABS, Take 1 tablet by mouth daily., Disp: , Rfl:    celecoxib  (CELEBREX ) 200 MG capsule, Take 1 capsule (200 mg total) by mouth daily as needed for moderate pain (pain score 4-6)., Disp: 90 capsule, Rfl: 1   estrogen-methylTESTOSTERone 0.625-1.25 MG tablet, Take 1 tablet by mouth daily., Disp: 90 tablet, Rfl: 1   famotidine  (PEPCID ) 20 MG tablet, Take 1 tablet (20 mg total) by mouth daily., Disp: 90 tablet, Rfl: 0   fluticasone  (FLONASE ) 50 MCG/ACT nasal spray, Place 2 sprays into both nostrils 2 (two) times daily., Disp: 16 g, Rfl: 1   Omega 3-6-9 Fatty Acids (TRIPLE OMEGA-3-6-9) CAPS, Take 1 capsule by mouth daily. 1200 mg Omega daily, Disp: , Rfl:    Probiotic Product (PROBIOTIC PEARLS PO), Take 1 each by mouth daily. Softgel for women's vaginal and urinary tract health, Disp: , Rfl:     trospium (SANCTURA) 20 MG tablet, Take 20 mg by mouth 2 (two) times daily., Disp: , Rfl:  [2]  Allergies Allergen Reactions   Tizanidine  Other (See Comments)    Unable to sleep.  tizanidine    Amoxicillin -Pot Clavulanate Other (See Comments)    Severe yeast infection  amoxicillin  / clavulanate

## 2024-05-09 LAB — URINE CULTURE
MICRO NUMBER:: 17392373
SPECIMEN QUALITY:: ADEQUATE

## 2024-05-10 ENCOUNTER — Ambulatory Visit: Payer: Self-pay | Admitting: Family Medicine

## 2024-05-12 ENCOUNTER — Encounter: Payer: Self-pay | Admitting: Internal Medicine

## 2024-05-13 ENCOUNTER — Other Ambulatory Visit: Payer: Self-pay | Admitting: Family Medicine

## 2024-05-13 MED ORDER — NITROFURANTOIN MONOHYD MACRO 100 MG PO CAPS: 100.0000 mg | ORAL_CAPSULE | Freq: Two times a day (BID) | ORAL | 0 refills | Status: AC

## 2024-05-14 ENCOUNTER — Ambulatory Visit

## 2024-06-10 ENCOUNTER — Other Ambulatory Visit

## 2024-06-10 ENCOUNTER — Ambulatory Visit

## 2024-06-11 ENCOUNTER — Telehealth: Admitting: Physician Assistant

## 2024-06-11 DIAGNOSIS — B9689 Other specified bacterial agents as the cause of diseases classified elsewhere: Secondary | ICD-10-CM | POA: Diagnosis not present

## 2024-06-11 DIAGNOSIS — J208 Acute bronchitis due to other specified organisms: Secondary | ICD-10-CM

## 2024-06-11 MED ORDER — BENZONATATE 100 MG PO CAPS: 100.0000 mg | ORAL_CAPSULE | Freq: Three times a day (TID) | ORAL | 0 refills | Status: DC | PRN

## 2024-06-11 MED ORDER — AZITHROMYCIN 250 MG PO TABS: ORAL_TABLET | ORAL | 0 refills | Status: AC

## 2024-06-11 NOTE — Progress Notes (Signed)
 We are sorry that you are not feeling well.  Here is how we plan to help!  Based on your presentation I believe you most likely have A cough due to bacteria.  When patients have a fever and a productive cough with a change in color or increased sputum production, we are concerned about bacterial bronchitis.  If left untreated it can progress to pneumonia.  If your symptoms do not improve with your treatment plan it is important that you contact your provider.   I have prescribed Azithromyin 250 mg: two tablets now and then one tablet daily for 4 additonal days    In addition you may use A prescription cough medication called Tessalon Perles 100mg . You may take 1-2 capsules every 8 hours as needed for your cough.  From your responses in the eVisit questionnaire you describe inflammation in the upper respiratory tract which is causing a significant cough.  This is commonly called Bronchitis and has four common causes:   Allergies Viral Infections Acid Reflux Bacterial Infection Allergies, viruses and acid reflux are treated by controlling symptoms or eliminating the cause. An example might be a cough caused by taking certain blood pressure medications. You stop the cough by changing the medication. Another example might be a cough caused by acid reflux. Controlling the reflux helps control the cough.     HOME CARE Only take medications as instructed by your medical team. Complete the entire course of an antibiotic. Drink plenty of fluids and get plenty of rest. Avoid close contacts especially the very young and the elderly Cover your mouth if you cough or cough into your sleeve. Always remember to wash your hands A steam or ultrasonic humidifier can help congestion.   GET HELP RIGHT AWAY IF: You develop worsening fever. You become short of breath You cough up blood. Your symptoms persist after you have completed your treatment plan MAKE SURE YOU  Understand these instructions. Will watch  your condition. Will get help right away if you are not doing well or get worse.  Your e-visit answers were reviewed by a board certified advanced clinical practitioner to complete your personal care plan.  Depending on the condition, your plan could have included both over the counter or prescription medications. If there is a problem please reply  once you have received a response from your provider. Your safety is important to us .  If you have drug allergies check your prescription carefully.    You can use MyChart to ask questions about today's visit, request a non-urgent call back, or ask for a work or school excuse for 24 hours related to this e-Visit. If it has been greater than 24 hours you will need to follow up with your provider, or enter a new e-Visit to address those concerns. You will get an e-mail in the next two days asking about your experience.  I hope that your e-visit has been valuable and will speed your recovery. Thank you for using e-visits.   I have spent 5 minutes in review of e-visit questionnaire, review and updating patient chart, medical decision making and response to patient.   Delon CHRISTELLA Dickinson, PA-C

## 2024-06-16 ENCOUNTER — Telehealth

## 2024-06-16 DIAGNOSIS — B9689 Other specified bacterial agents as the cause of diseases classified elsewhere: Secondary | ICD-10-CM

## 2024-06-20 ENCOUNTER — Ambulatory Visit

## 2024-06-20 DIAGNOSIS — Z0000 Encounter for general adult medical examination without abnormal findings: Secondary | ICD-10-CM

## 2024-06-20 NOTE — Progress Notes (Signed)
 "  Chief Complaint  Patient presents with   Medicare Wellness     Subjective:   [name] is a 79 y.o. female who presents for a Medicare Annual Wellness Visit.  Visit info / Clinical Intake: Medicare Wellness Visit Type:: Subsequent Annual Wellness Visit Persons participating in visit and providing information:: patient Medicare Wellness Visit Mode:: Video Since this visit was completed virtually, some vitals may be partially provided or unavailable. Missing vitals are due to the limitations of the virtual format.: Unable to obtain vitals - no equipment If Telephone or Video please confirm:: I connected with patient using audio/video enable telemedicine. I verified patient identity with two identifiers, discussed telehealth limitations, and patient agreed to proceed. Patient Location:: HOME Provider Location:: OFFICE Interpreter Needed?: No Pre-visit prep was completed: yes AWV questionnaire completed by patient prior to visit?: no Living arrangements:: with family/others Patient's Overall Health Status Rating: excellent Typical amount of pain: none Does pain affect daily life?: no Are you currently prescribed opioids?: no  Dietary Habits and Nutritional Risks How many meals a day?: 3 Eats fruit and vegetables daily?: yes Most meals are obtained by: preparing own meals In the last 2 weeks, have you had any of the following?: none Diabetic:: no  Functional Status Activities of Daily Living (to include ambulation/medication): Independent Ambulation: Independent Medication Administration: Independent Home Management (perform basic housework or laundry): Independent Manage your own finances?: yes Primary transportation is: driving Concerns about vision?: no *vision screening is required for [facility]* (NO GLASSES- WOODARD- YEARLY) Concerns about hearing?: no  Fall Screening Falls in the past year?: 0 Number of falls in past year: 0 Was there an injury with Fall?: 0 Fall  Risk Category Calculator: 0 Patient Fall Risk Level: Low Fall Risk  Fall Risk Patient at Risk for Falls Due to: No Fall Risks Fall risk Follow up: Falls evaluation completed; Falls prevention discussed  Home and Transportation Safety: All rugs have non-skid backing?: yes All stairs or steps have railings?: yes Grab bars in the bathtub or shower?: yes Have non-skid surface in bathtub or shower?: yes Good home lighting?: yes Regular seat belt use?: yes Hospital stays in the last year:: no  Cognitive Assessment Difficulty concentrating, remembering, or making decisions? : no Will 6CIT or Mini Cog be Completed: yes What year is it?: 0 points What month is it?: 0 points Give patient an address phrase to remember (5 components): [address] [place], [place] About what time is it?: 0 points Count backwards from 20 to 1: 0 points Say the months of the year in reverse: 0 points Repeat the address phrase from earlier: 0 points 6 CIT Score: 0 points  Advance Directives (For Healthcare) Does Patient Have a Medical Advance Directive?: No Would patient like information on creating a medical advance directive?: No - Patient declined  Reviewed/Updated  Reviewed/Updated: Reviewed All (Medical, Surgical, Family, Medications, Allergies, Care Teams, Patient Goals)    Allergies (verified) Tizanidine  and Amoxicillin -pot clavulanate   Current Medications (verified) Outpatient Encounter Medications as of 06/20/2024  Medication Sig   Calcium -Magnesium -Zinc 334-134-5 MG TABS Take 1 tablet by mouth daily.   celecoxib  (CELEBREX ) 200 MG capsule Take 1 capsule (200 mg total) by mouth daily as needed for moderate pain (pain score 4-6). (Patient taking differently: Take 200 mg by mouth daily as needed for moderate pain (pain score 4-6). TAKES EVERY OTHER DAY)   estrogen-methylTESTOSTERone 0.625-1.25 MG tablet Take 1 tablet by mouth daily.   famotidine  (PEPCID ) 20 MG tablet Take  1 tablet (20 mg total) by  mouth daily.   Omega 3-6-9 Fatty Acids (TRIPLE OMEGA-3-6-9) CAPS Take 1 capsule by mouth daily. 1200 mg Omega daily   Probiotic Product (PROBIOTIC PEARLS PO) Take 1 each by mouth daily. Softgel for women's vaginal and urinary tract health   trospium (SANCTURA) 20 MG tablet Take 20 mg by mouth 2 (two) times daily.   fluticasone  (FLONASE ) 50 MCG/ACT nasal spray Place 2 sprays into both nostrils 2 (two) times daily. (Patient not taking: Reported on 06/20/2024)   nitrofurantoin , macrocrystal-monohydrate, (MACROBID ) 100 MG capsule Take 1 capsule (100 mg total) by mouth 2 (two) times daily. (Patient not taking: Reported on 06/20/2024)   No facility-administered encounter medications on file as of 06/20/2024.    History: Past Medical History:  Diagnosis Date   Acute renal insufficiency 02/02/2018   Anxiety 08/05/2020   Arthralgia 08/05/2020   Arthralgia of both knees 03/01/2021   Arthralgia of both knees 03/01/2021   Arthritis    in back   Cataract 2011   remove from right eye   Closed fracture of medial cuneiform bone of foot 09/27/2022   Closed fracture of metatarsal bone 05/23/2023   Closed fracture of second metatarsal bone of left foot 09/22/2022   Contusion of left foot 09/27/2022   Depression 11/05/2020   Edema of lower extremity 09/27/2022   Fatigue 01/08/2021   Hormone replacement therapy (postmenopausal) 10/28/2015   Hypokalemia 02/01/2018   Leukocytosis 01/31/2018   Localized edema 10/25/2022   Luetscher's syndrome 02/01/2018   Metatarsalgia of left foot 04/25/2023   Mixed hyperlipidemia 10/28/2015   Multiple thyroid  nodules 10/08/2019   Osteoarthritis 04/19/2021   Osteopenia    Overactive bladder 06/24/2019   Pain in left foot 04/11/2023   Patient is full code 12/05/2016   Prerenal renal failure 02/01/2018   Pulmonary nodules 04/12/2019   Seen on CT in 08/2018 at [facility]. Consider repeat CT in 08/2019 if high risk.      IMPRESSION:  1. No evidence of pulmonary embolism.  No acute intrathoracic  process.  2. Two 4-5 mm pulmonary nodules in the right middle and lower lobes.  No follow-up needed if patient is low-risk (and has no known or  suspected primary neoplasm). Non-contrast chest CT can be considered  in 12 months if patient i   Sepsis due to Escherichia coli (HCC) 02/02/2018   Sprain of ligament of tarsometatarsal joint 09/27/2022   SUI (stress urinary incontinence, female) 01/23/2023   TMJ (dislocation of temporomandibular joint)    Urinary tract infection 01/31/2018   Past Surgical History:  Procedure Laterality Date    MESH SLING X 15 YRS     ANTERIOR AND POSTERIOR REPAIR     AUGMENTATION MAMMAPLASTY Bilateral    20 yrs ago , then replaced 5 yrs ago   BLADDER SUSPENSION     BREAST ENHANCEMENT SURGERY     BREAST SURGERY  2012   BILATERAL BREAST IMPLANT REMOVAL   COLONOSCOPY WITH PROPOFOL  N/A 11/28/2018   Procedure: COLONOSCOPY WITH PROPOFOL ;  Surgeon: Janalyn Keene NOVAK, MD;  Location: [facility] ENDOSCOPY;  Service: Endoscopy;  Laterality: N/A;   ESOPHAGOGASTRODUODENOSCOPY (EGD) WITH PROPOFOL  N/A 05/04/2018   Procedure: ESOPHAGOGASTRODUODENOSCOPY (EGD) WITH PROPOFOL ;  Surgeon: Janalyn Keene NOVAK, MD;  Location: [facility] ENDOSCOPY;  Service: Endoscopy;  Laterality: N/A;   EYE SURGERY  20114   right eye   INCONTINENCE SURGERY     NASAL SINUS SURGERY     NOSE SURGERY     VAGINAL HYSTERECTOMY  still has ovaries   Family History  Problem Relation Age of Onset   Cancer Mother 51       COLON AND UTERINE   Cancer Father        PROSTATE   Diabetes Father    Hypertension Father    Stroke Father    Arthritis Sister    Diabetes Son    Breast cancer Neg Hx    Social History   Occupational History   Occupation: Retired  Tobacco Use   Smoking status: Never   Smokeless tobacco: Never   Tobacco comments:    smoking cessation materials not required  Vaping Use   Vaping status: Never Used  Substance and Sexual Activity   Alcohol use: No   Drug  use: No   Sexual activity: Not Currently    Partners: Male    Birth control/protection: Surgical    Comment: Hysterectomy   Tobacco Counseling Counseling given: Not Answered Tobacco comments: smoking cessation materials not required  SDOH Screenings   Food Insecurity: No Food Insecurity (06/20/2024)  Housing: Low Risk (06/20/2024)  Transportation Needs: No Transportation Needs (06/20/2024)  Utilities: Not At Risk (06/20/2024)  Alcohol Screen: Low Risk (01/29/2024)  Depression (PHQ2-9): Low Risk (06/20/2024)  Financial Resource Strain: Low Risk (06/20/2022)   [facility]  Physical Activity: Sufficiently Active (06/20/2024)  Social Connections: Moderately Integrated (06/20/2024)  Stress: No Stress Concern Present (06/20/2024)  Tobacco Use: Low Risk (06/20/2024)  Health Literacy: Adequate Health Literacy (06/20/2024)   See flowsheets for full screening details  Depression Screen PHQ 2 & 9 Depression Scale- Over the past 2 weeks, how often have you been bothered by any of the following problems? Little interest or pleasure in doing things: 0 Feeling down, depressed, or hopeless (PHQ Adolescent also includes...irritable): 0 PHQ-2 Total Score: 0 Trouble falling or staying asleep, or sleeping too much: 0 Feeling tired or having little energy: 0 Poor appetite or overeating (PHQ Adolescent also includes...weight loss): 0 Feeling bad about yourself - or that you are a failure or have let yourself or your family down: 0 Trouble concentrating on things, such as reading the newspaper or watching television (PHQ Adolescent also includes...like school work): 0 Moving or speaking so slowly that other people could have noticed. Or the opposite - being so fidgety or restless that you have been moving around a lot more than usual: 0 Thoughts that you would be better off dead, or of hurting yourself in some way: 0 PHQ-9 Total Score: 0 If you checked off any problems, how difficult have these problems  made it for you to do your work, take care of things at home, or get along with other people?: Not difficult at all  Depression Treatment Depression Interventions/Treatment : EYV7-0 Score <4 Follow-up Not Indicated     Goals Addressed             This Visit's Progress    DIET - EAT MORE FRUITS AND VEGETABLES               Objective:    There were no vitals filed for this visit. There is no height or weight on file to calculate BMI.  Hearing/Vision screen No results found. Immunizations and Health Maintenance Health Maintenance  Topic Date Due   Colonoscopy  11/28/2023   COVID-19 Vaccine (1 - 2025-26 season) Never done   Mammogram  06/05/2024   Influenza Vaccine  08/13/2024 (Originally 12/15/2023)   Medicare Annual Wellness (AWV)  06/20/2025  Bone Density Scan  07/16/2025   DTaP/Tdap/Td (3 - Td or Tdap) 07/18/2033   Pneumococcal Vaccine: 50+ Years  Completed   Hepatitis C Screening  Completed   Zoster Vaccines- Shingrix  Completed   Meningococcal B Vaccine  Aged Out        Assessment/Plan:  This is a routine wellness examination for [place].  Patient Care Team: Bernardo Fend, DO as PCP - General (Internal Medicine) Mevelyn JONETTA Bathe, OD (Optometry) Dasher, Alm LABOR, MD (Dermatology)  I have personally reviewed and noted the following in the patients chart:   Medical and social history Use of alcohol, tobacco or illicit drugs  Current medications and supplements including opioid prescriptions. Functional ability and status Nutritional status Physical activity Advanced directives List of other physicians Hospitalizations, surgeries, and ER visits in previous 12 months Vitals Screenings to include cognitive, depression, and falls Referrals and appointments  No orders of the defined types were placed in this encounter.  In addition, I have reviewed and discussed with patient certain preventive protocols, quality metrics, and best practice  recommendations. A written personalized care plan for preventive services as well as general preventive health recommendations were provided to patient.   Jhonnie GORMAN Das, LPN   11/17/7971   Return in about 1 year (around 06/20/2025).  After Visit Summary: (MyChart) Due to this being a telephonic visit, the after visit summary with patients personalized plan was offered to patient via MyChart   Nurse Notes: UTD ON SHOTS EXCEPT FLU; AGED OUT OF MAMMOGRAM & COLONOSCOPY BUT HAS APPT FOR COLO. NEXT WEEK; UTD ON BDS  No voiced or noted concerns at this time  "

## 2024-06-20 NOTE — Patient Instructions (Addendum)
 Ms. Covell,  Thank you for taking the time for your Medicare Wellness Visit. I appreciate your continued commitment to your health goals. Please review the care plan we discussed, and feel free to reach out if I can assist you further.  Please note that Annual Wellness Visits do not include a physical exam. Some assessments may be limited, especially if the visit was conducted virtually. If needed, we may recommend an in-person follow-up with your provider.  Ongoing Care Seeing your primary care provider every 3 to 6 months helps us  monitor your health and provide consistent, personalized care. 07/29/24 @ 10:20 AM APPT W/ DR.ANDREWS  Referrals If a referral was made during today's visit and you haven't received any updates within two weeks, please contact the referred provider directly to check on the status.  Recommended Screenings:  Health Maintenance  Topic Date Due   Medicare Annual Wellness Visit  01/13/2022   Colon Cancer Screening  11/28/2023   COVID-19 Vaccine (1 - 2025-26 season) Never done   Breast Cancer Screening  06/05/2024   Flu Shot  08/13/2024*   Osteoporosis screening with Bone Density Scan  07/16/2025   DTaP/Tdap/Td vaccine (3 - Td or Tdap) 07/18/2033   Pneumococcal Vaccine for age over 70  Completed   Hepatitis C Screening  Completed   Zoster (Shingles) Vaccine  Completed   Meningitis B Vaccine  Aged Out  *Topic was postponed. The date shown is not the original due date.     Vision: Annual vision screenings are recommended for early detection of glaucoma, cataracts, and diabetic retinopathy. These exams can also reveal signs of chronic conditions such as diabetes and high blood pressure.  Dental: Annual dental screenings help detect early signs of oral cancer, gum disease, and other conditions linked to overall health, including heart disease and diabetes.  Please see the attached documents for additional preventive care recommendations.   NEXT AWV  ON 06/26/25 @  3:40 PM IN PERSON

## 2024-06-25 ENCOUNTER — Ambulatory Visit: Admission: RE | Admit: 2024-06-25 | Admitting: Gastroenterology

## 2024-06-25 ENCOUNTER — Encounter: Admission: RE | Payer: Self-pay | Source: Home / Self Care

## 2024-07-29 ENCOUNTER — Ambulatory Visit: Admitting: Internal Medicine

## 2025-06-26 ENCOUNTER — Ambulatory Visit
